# Patient Record
Sex: Female | Born: 1940 | Race: White | Hispanic: No | Smoking: Former smoker
Health system: Southern US, Community
[De-identification: ages and names within clinical notes are randomized; demographics above are authoritative.]

## PROBLEM LIST (undated history)

## (undated) DIAGNOSIS — E7800 Pure hypercholesterolemia, unspecified: Secondary | ICD-10-CM

## (undated) DIAGNOSIS — I10 Essential (primary) hypertension: Secondary | ICD-10-CM

## (undated) DIAGNOSIS — D509 Iron deficiency anemia, unspecified: Secondary | ICD-10-CM

## (undated) HISTORY — PX: ABDOMINAL HYSTERECTOMY: SHX81

## (undated) HISTORY — PX: APPENDECTOMY: SHX54

## (undated) HISTORY — PX: KNEE ARTHROSCOPY: SUR90

## (undated) HISTORY — DX: Iron deficiency anemia, unspecified: D50.9

---

## 1977-03-12 HISTORY — PX: PARTIAL HYSTERECTOMY: SHX80

## 1999-03-13 HISTORY — PX: LAPAROSCOPIC BILATERAL SALPINGO OOPHERECTOMY: SHX5890

## 1999-05-02 ENCOUNTER — Ambulatory Visit: Admission: RE | Admit: 1999-05-02 | Discharge: 1999-05-02 | Payer: Self-pay | Admitting: Gynecology

## 1999-05-05 ENCOUNTER — Encounter: Payer: Self-pay | Admitting: Gynecology

## 1999-05-09 ENCOUNTER — Inpatient Hospital Stay (HOSPITAL_COMMUNITY): Admission: RE | Admit: 1999-05-09 | Discharge: 1999-05-11 | Payer: Self-pay | Admitting: Gynecology

## 1999-05-09 ENCOUNTER — Encounter (INDEPENDENT_AMBULATORY_CARE_PROVIDER_SITE_OTHER): Payer: Self-pay

## 1999-07-05 ENCOUNTER — Ambulatory Visit: Admission: RE | Admit: 1999-07-05 | Discharge: 1999-07-05 | Payer: Self-pay | Admitting: Gynecology

## 2003-03-24 ENCOUNTER — Emergency Department (HOSPITAL_COMMUNITY): Admission: EM | Admit: 2003-03-24 | Discharge: 2003-03-24 | Payer: Self-pay | Admitting: *Deleted

## 2004-01-31 ENCOUNTER — Ambulatory Visit (HOSPITAL_COMMUNITY): Admission: RE | Admit: 2004-01-31 | Discharge: 2004-01-31 | Payer: Self-pay | Admitting: Internal Medicine

## 2004-05-12 ENCOUNTER — Ambulatory Visit (HOSPITAL_COMMUNITY): Admission: RE | Admit: 2004-05-12 | Discharge: 2004-05-12 | Payer: Self-pay | Admitting: Family Medicine

## 2004-12-28 ENCOUNTER — Ambulatory Visit (HOSPITAL_COMMUNITY): Admission: RE | Admit: 2004-12-28 | Discharge: 2004-12-28 | Payer: Self-pay | Admitting: Family Medicine

## 2005-01-04 ENCOUNTER — Ambulatory Visit: Payer: Self-pay | Admitting: Orthopedic Surgery

## 2005-07-19 ENCOUNTER — Ambulatory Visit (HOSPITAL_COMMUNITY): Admission: RE | Admit: 2005-07-19 | Discharge: 2005-07-19 | Payer: Self-pay | Admitting: Internal Medicine

## 2006-06-09 ENCOUNTER — Emergency Department (HOSPITAL_COMMUNITY): Admission: EM | Admit: 2006-06-09 | Discharge: 2006-06-09 | Payer: Self-pay | Admitting: Emergency Medicine

## 2006-12-05 ENCOUNTER — Ambulatory Visit (HOSPITAL_COMMUNITY): Admission: RE | Admit: 2006-12-05 | Discharge: 2006-12-05 | Payer: Self-pay | Admitting: Internal Medicine

## 2007-12-18 ENCOUNTER — Ambulatory Visit (HOSPITAL_COMMUNITY): Admission: RE | Admit: 2007-12-18 | Discharge: 2007-12-18 | Payer: Self-pay | Admitting: Internal Medicine

## 2008-03-12 HISTORY — PX: COLONOSCOPY: SHX174

## 2008-10-27 ENCOUNTER — Encounter: Payer: Self-pay | Admitting: Internal Medicine

## 2008-11-02 ENCOUNTER — Telehealth (INDEPENDENT_AMBULATORY_CARE_PROVIDER_SITE_OTHER): Payer: Self-pay

## 2008-11-16 ENCOUNTER — Encounter: Payer: Self-pay | Admitting: Internal Medicine

## 2008-11-22 ENCOUNTER — Encounter: Payer: Self-pay | Admitting: Internal Medicine

## 2008-11-22 ENCOUNTER — Ambulatory Visit (HOSPITAL_COMMUNITY): Admission: RE | Admit: 2008-11-22 | Discharge: 2008-11-22 | Payer: Self-pay | Admitting: Internal Medicine

## 2008-11-22 ENCOUNTER — Ambulatory Visit: Payer: Self-pay | Admitting: Internal Medicine

## 2008-11-23 ENCOUNTER — Encounter: Payer: Self-pay | Admitting: Internal Medicine

## 2008-12-20 ENCOUNTER — Ambulatory Visit (HOSPITAL_COMMUNITY): Admission: RE | Admit: 2008-12-20 | Discharge: 2008-12-20 | Payer: Self-pay | Admitting: Internal Medicine

## 2009-03-03 ENCOUNTER — Encounter (INDEPENDENT_AMBULATORY_CARE_PROVIDER_SITE_OTHER): Payer: Self-pay | Admitting: *Deleted

## 2009-04-05 ENCOUNTER — Encounter (INDEPENDENT_AMBULATORY_CARE_PROVIDER_SITE_OTHER): Payer: Self-pay | Admitting: *Deleted

## 2009-12-22 ENCOUNTER — Ambulatory Visit (HOSPITAL_COMMUNITY): Admission: RE | Admit: 2009-12-22 | Discharge: 2009-12-22 | Payer: Self-pay | Admitting: Internal Medicine

## 2010-04-11 NOTE — Letter (Signed)
Summary: Colonoscopy-Changed to Office Visit Letter  [facility]  [address] [place], [place] [address]   Phone: [phone]  Fax: [phone]      April 05, 2009 MRN: [id number]   Sara Fischer [address] [place], [place]  [address]   Dear Ms. Mcshan,   According to our records, it is time for you to schedule a Colonoscopy. However, after reviewing your medical record, I feel that an office visit would be most appropriate to more completely evaluate you and determine your need for a repeat procedure.  Please call [phone] (option #2) at your convenience to schedule an office visit. If you have any questions, concerns, or feel that this letter is in error, we would appreciate your call.   Sincerely,    [facility] [phone]

## 2010-11-08 ENCOUNTER — Ambulatory Visit (HOSPITAL_COMMUNITY)
Admission: RE | Admit: 2010-11-08 | Discharge: 2010-11-08 | Disposition: A | Discharge status: Home / Self Care | Payer: Medicare PPO | Source: Ambulatory Visit | Attending: Internal Medicine | Admitting: Internal Medicine

## 2010-11-08 ENCOUNTER — Encounter (HOSPITAL_COMMUNITY): Payer: Self-pay

## 2010-11-08 ENCOUNTER — Other Ambulatory Visit (HOSPITAL_COMMUNITY): Payer: Self-pay | Admitting: Internal Medicine

## 2010-11-08 DIAGNOSIS — M899 Disorder of bone, unspecified: Secondary | ICD-10-CM | POA: Insufficient documentation

## 2010-11-08 DIAGNOSIS — M949 Disorder of cartilage, unspecified: Secondary | ICD-10-CM | POA: Insufficient documentation

## 2010-11-08 DIAGNOSIS — R079 Chest pain, unspecified: Secondary | ICD-10-CM

## 2010-11-08 DIAGNOSIS — R0789 Other chest pain: Secondary | ICD-10-CM | POA: Insufficient documentation

## 2010-11-08 HISTORY — DX: Essential (primary) hypertension: I10

## 2010-12-15 ENCOUNTER — Other Ambulatory Visit (HOSPITAL_COMMUNITY): Payer: Self-pay | Admitting: Internal Medicine

## 2010-12-15 DIAGNOSIS — Z139 Encounter for screening, unspecified: Secondary | ICD-10-CM

## 2010-12-26 ENCOUNTER — Ambulatory Visit (HOSPITAL_COMMUNITY)
Admission: RE | Admit: 2010-12-26 | Discharge: 2010-12-26 | Disposition: A | Discharge status: Home / Self Care | Payer: Medicare PPO | Source: Ambulatory Visit | Attending: Internal Medicine | Admitting: Internal Medicine

## 2010-12-26 DIAGNOSIS — Z1231 Encounter for screening mammogram for malignant neoplasm of breast: Secondary | ICD-10-CM | POA: Insufficient documentation

## 2010-12-26 DIAGNOSIS — Z139 Encounter for screening, unspecified: Secondary | ICD-10-CM

## 2011-06-11 ENCOUNTER — Other Ambulatory Visit (HOSPITAL_COMMUNITY): Payer: Self-pay | Admitting: Family Medicine

## 2011-06-11 DIAGNOSIS — Z139 Encounter for screening, unspecified: Secondary | ICD-10-CM

## 2011-06-12 ENCOUNTER — Ambulatory Visit (HOSPITAL_COMMUNITY)
Admission: RE | Admit: 2011-06-12 | Discharge: 2011-06-12 | Disposition: A | Discharge status: Home / Self Care | Payer: Medicare Other | Source: Ambulatory Visit | Attending: Family Medicine | Admitting: Family Medicine

## 2011-06-12 DIAGNOSIS — M810 Age-related osteoporosis without current pathological fracture: Secondary | ICD-10-CM | POA: Insufficient documentation

## 2011-06-12 DIAGNOSIS — Z780 Asymptomatic menopausal state: Secondary | ICD-10-CM | POA: Insufficient documentation

## 2011-06-12 DIAGNOSIS — Z139 Encounter for screening, unspecified: Secondary | ICD-10-CM

## 2011-06-12 DIAGNOSIS — Z13820 Encounter for screening for osteoporosis: Secondary | ICD-10-CM | POA: Insufficient documentation

## 2011-12-19 ENCOUNTER — Other Ambulatory Visit (HOSPITAL_COMMUNITY): Payer: Self-pay | Admitting: Internal Medicine

## 2011-12-19 DIAGNOSIS — Z139 Encounter for screening, unspecified: Secondary | ICD-10-CM

## 2011-12-27 ENCOUNTER — Ambulatory Visit (HOSPITAL_COMMUNITY)
Admission: RE | Admit: 2011-12-27 | Discharge: 2011-12-27 | Disposition: A | Discharge status: Home / Self Care | Payer: Medicare Other | Source: Ambulatory Visit | Attending: Internal Medicine | Admitting: Internal Medicine

## 2011-12-27 DIAGNOSIS — Z139 Encounter for screening, unspecified: Secondary | ICD-10-CM

## 2011-12-27 DIAGNOSIS — Z1231 Encounter for screening mammogram for malignant neoplasm of breast: Secondary | ICD-10-CM | POA: Insufficient documentation

## 2012-12-17 ENCOUNTER — Other Ambulatory Visit (HOSPITAL_COMMUNITY): Payer: Self-pay | Admitting: Family Medicine

## 2012-12-17 DIAGNOSIS — Z139 Encounter for screening, unspecified: Secondary | ICD-10-CM

## 2012-12-30 ENCOUNTER — Ambulatory Visit (HOSPITAL_COMMUNITY)
Admission: RE | Admit: 2012-12-30 | Discharge: 2012-12-30 | Disposition: A | Discharge status: Home / Self Care | Payer: Medicare Other | Source: Ambulatory Visit | Attending: Family Medicine | Admitting: Family Medicine

## 2012-12-30 DIAGNOSIS — Z1231 Encounter for screening mammogram for malignant neoplasm of breast: Secondary | ICD-10-CM | POA: Insufficient documentation

## 2012-12-30 DIAGNOSIS — Z139 Encounter for screening, unspecified: Secondary | ICD-10-CM

## 2013-11-02 ENCOUNTER — Ambulatory Visit (INDEPENDENT_AMBULATORY_CARE_PROVIDER_SITE_OTHER): Payer: Commercial Managed Care - HMO | Admitting: Podiatry

## 2013-11-02 ENCOUNTER — Ambulatory Visit (INDEPENDENT_AMBULATORY_CARE_PROVIDER_SITE_OTHER): Payer: Commercial Managed Care - HMO

## 2013-11-02 ENCOUNTER — Encounter: Payer: Self-pay | Admitting: Podiatry

## 2013-11-02 VITALS — BP 142/68 | HR 73 | Resp 16 | Ht 62.0 in | Wt 170.0 lb

## 2013-11-02 DIAGNOSIS — M779 Enthesopathy, unspecified: Secondary | ICD-10-CM

## 2013-11-02 DIAGNOSIS — M898X9 Other specified disorders of bone, unspecified site: Secondary | ICD-10-CM

## 2013-11-02 DIAGNOSIS — L84 Corns and callosities: Secondary | ICD-10-CM | POA: Diagnosis not present

## 2013-11-02 DIAGNOSIS — M2040 Other hammer toe(s) (acquired), unspecified foot: Secondary | ICD-10-CM

## 2013-11-02 MED ORDER — TRIAMCINOLONE ACETONIDE 10 MG/ML IJ SUSP
10.0000 mg | Freq: Once | INTRAMUSCULAR | Status: AC
  Administered 2013-11-02: 10 mg

## 2013-11-02 NOTE — Progress Notes (Signed)
Subjective:     Patient ID: Sara Fischer, female   DOB: Oct 27, 1940, 73 y.o.   MRN: [id number]  Toe Pain    patient states she's getting a lot of pain between the fourth and fifth toes on her left foot and she tries today and 5 days a week and is now been able to due to the discomfort   Review of Systems  All other systems reviewed and are negative.      Objective:   Physical Exam  Nursing note and vitals reviewed. Constitutional: She is oriented to person, place, and time.  Cardiovascular: Intact distal pulses.   Musculoskeletal: Normal range of motion.  Neurological: She is oriented to person, place, and time.  Skin: Skin is warm and dry.   neurovascular status found to be intact with muscle strength adequate and range of motion subtalar midtarsal joint within normal limits. Digits are well-perfused and patient is well oriented x3 and is found to have a keratotic lesion between the fourth and fifth toes left foot with fluid buildup noted at the fourth MPJ metatarsal phalangeal     Assessment:     Capsulitis with interdigital keratotic lesion secondary to abnormal bone position    Plan:     H&P and x-ray reviewed and today I injected the fourth MPJ capsule 3 mg dexamethasone 5 mg Xylocaine and after appropriate numbness did deep debridement of lesion and applied dressing. Reappoint to recheck when symptomatic and may have to consider partial syndactylization surgery

## 2013-11-02 NOTE — Progress Notes (Signed)
   Subjective:    Patient ID: GRACIE GUPTA, female    DOB: Dec 12, 1940, 73 y.o.   MRN: [id number]  HPI Comments: "I have a toe that hurts"  Patient c/o burning 5th toe and in between 4th and 5th left foot for about 1 month. There is a callused area interdigitally. She keeps vaseline on it.  Toe Pain       Review of Systems  HENT: Positive for ear pain, sinus pressure and sneezing.   Respiratory: Positive for cough.   Gastrointestinal: Positive for constipation and abdominal distention.  Neurological: Positive for headaches.  Hematological: Bruises/bleeds easily.  All other systems reviewed and are negative.      Objective:   Physical Exam        Assessment & Plan:

## 2013-12-18 ENCOUNTER — Other Ambulatory Visit (HOSPITAL_COMMUNITY): Payer: Self-pay | Admitting: Internal Medicine

## 2013-12-18 DIAGNOSIS — Z1231 Encounter for screening mammogram for malignant neoplasm of breast: Secondary | ICD-10-CM

## 2014-01-01 ENCOUNTER — Ambulatory Visit (HOSPITAL_COMMUNITY)
Admission: RE | Admit: 2014-01-01 | Discharge: 2014-01-01 | Disposition: A | Discharge status: Home / Self Care | Payer: Medicare HMO | Source: Ambulatory Visit | Attending: Internal Medicine | Admitting: Internal Medicine

## 2014-01-01 DIAGNOSIS — Z1231 Encounter for screening mammogram for malignant neoplasm of breast: Secondary | ICD-10-CM | POA: Diagnosis present

## 2014-01-06 ENCOUNTER — Ambulatory Visit (HOSPITAL_COMMUNITY): Payer: Medicare HMO

## 2014-02-01 ENCOUNTER — Ambulatory Visit (INDEPENDENT_AMBULATORY_CARE_PROVIDER_SITE_OTHER): Payer: Commercial Managed Care - HMO | Admitting: Podiatry

## 2014-02-01 ENCOUNTER — Encounter: Payer: Self-pay | Admitting: Podiatry

## 2014-02-01 ENCOUNTER — Other Ambulatory Visit (HOSPITAL_COMMUNITY): Payer: Self-pay | Admitting: Family Medicine

## 2014-02-01 VITALS — BP 153/78 | HR 74 | Resp 16

## 2014-02-01 DIAGNOSIS — M779 Enthesopathy, unspecified: Secondary | ICD-10-CM

## 2014-02-01 DIAGNOSIS — L84 Corns and callosities: Secondary | ICD-10-CM

## 2014-02-01 MED ORDER — TRIAMCINOLONE ACETONIDE 10 MG/ML IJ SUSP
10.0000 mg | Freq: Once | INTRAMUSCULAR | Status: AC
  Administered 2014-02-01: 10 mg

## 2014-02-01 NOTE — Progress Notes (Signed)
Subjective:     Patient ID: Sara Fischer, female   DOB: [birth year], 73 y.o.   MRN: [id number]  HPI patient states she is developing more discomfort between the fourth and fifth toes on the left foot with irritation fluid buildup and keratotic lesion formation   Review of Systems     Objective:   Physical Exam Neurovascular status unchanged with inflammation and pain fourth interspace left foot with fluid buildup and keratotic tissue formation    Assessment:     Metatarsal phalangeal joint capsulitis with inflammatory changes and keratotic lesion formation between the fourth and fifth toes left    Plan:     Explained the possibilities for surgery at one point in the future and today did an injection 3 mg Kenalog 5 mg Xylocaine and then did deep debridement of lesions with no iatrogenic bleeding noted applied sterile dressing and reappoint to recheck when symptomatic

## 2014-02-17 ENCOUNTER — Other Ambulatory Visit (HOSPITAL_COMMUNITY): Payer: Self-pay | Admitting: Family Medicine

## 2014-02-17 DIAGNOSIS — M159 Polyosteoarthritis, unspecified: Secondary | ICD-10-CM

## 2014-02-17 DIAGNOSIS — M150 Primary generalized (osteo)arthritis: Principal | ICD-10-CM

## 2014-02-22 ENCOUNTER — Ambulatory Visit (HOSPITAL_COMMUNITY)
Admission: RE | Admit: 2014-02-22 | Discharge: 2014-02-22 | Disposition: A | Discharge status: Home / Self Care | Payer: Commercial Managed Care - HMO | Source: Ambulatory Visit | Attending: Family Medicine | Admitting: Family Medicine

## 2014-02-22 DIAGNOSIS — M810 Age-related osteoporosis without current pathological fracture: Secondary | ICD-10-CM | POA: Diagnosis not present

## 2014-02-22 DIAGNOSIS — Z13820 Encounter for screening for osteoporosis: Secondary | ICD-10-CM | POA: Diagnosis not present

## 2014-02-22 DIAGNOSIS — Z780 Asymptomatic menopausal state: Secondary | ICD-10-CM | POA: Insufficient documentation

## 2014-02-22 DIAGNOSIS — Z90722 Acquired absence of ovaries, bilateral: Secondary | ICD-10-CM | POA: Insufficient documentation

## 2014-02-22 DIAGNOSIS — M159 Polyosteoarthritis, unspecified: Secondary | ICD-10-CM

## 2014-02-22 DIAGNOSIS — M150 Primary generalized (osteo)arthritis: Secondary | ICD-10-CM

## 2014-03-17 DIAGNOSIS — Z85828 Personal history of other malignant neoplasm of skin: Secondary | ICD-10-CM | POA: Diagnosis not present

## 2014-03-17 DIAGNOSIS — L570 Actinic keratosis: Secondary | ICD-10-CM | POA: Diagnosis not present

## 2014-03-17 DIAGNOSIS — L219 Seborrheic dermatitis, unspecified: Secondary | ICD-10-CM | POA: Diagnosis not present

## 2014-03-29 DIAGNOSIS — E538 Deficiency of other specified B group vitamins: Secondary | ICD-10-CM | POA: Diagnosis not present

## 2014-04-29 DIAGNOSIS — E538 Deficiency of other specified B group vitamins: Secondary | ICD-10-CM | POA: Diagnosis not present

## 2014-05-12 DIAGNOSIS — E6609 Other obesity due to excess calories: Secondary | ICD-10-CM | POA: Diagnosis not present

## 2014-05-12 DIAGNOSIS — I10 Essential (primary) hypertension: Secondary | ICD-10-CM | POA: Diagnosis not present

## 2014-05-12 DIAGNOSIS — M65811 Other synovitis and tenosynovitis, right shoulder: Secondary | ICD-10-CM | POA: Diagnosis not present

## 2014-05-12 DIAGNOSIS — Z6831 Body mass index (BMI) 31.0-31.9, adult: Secondary | ICD-10-CM | POA: Diagnosis not present

## 2014-05-27 DIAGNOSIS — E538 Deficiency of other specified B group vitamins: Secondary | ICD-10-CM | POA: Diagnosis not present

## 2014-07-01 DIAGNOSIS — E538 Deficiency of other specified B group vitamins: Secondary | ICD-10-CM | POA: Diagnosis not present

## 2014-07-27 DIAGNOSIS — E538 Deficiency of other specified B group vitamins: Secondary | ICD-10-CM | POA: Diagnosis not present

## 2014-08-26 DIAGNOSIS — E538 Deficiency of other specified B group vitamins: Secondary | ICD-10-CM | POA: Diagnosis not present

## 2014-09-07 ENCOUNTER — Ambulatory Visit (INDEPENDENT_AMBULATORY_CARE_PROVIDER_SITE_OTHER): Payer: Commercial Managed Care - HMO | Admitting: Podiatry

## 2014-09-07 ENCOUNTER — Encounter: Payer: Self-pay | Admitting: Podiatry

## 2014-09-07 VITALS — BP 174/93 | HR 63 | Resp 12

## 2014-09-07 DIAGNOSIS — M779 Enthesopathy, unspecified: Secondary | ICD-10-CM

## 2014-09-07 DIAGNOSIS — L600 Ingrowing nail: Secondary | ICD-10-CM

## 2014-09-07 DIAGNOSIS — L84 Corns and callosities: Secondary | ICD-10-CM | POA: Diagnosis not present

## 2014-09-07 MED ORDER — TRIAMCINOLONE ACETONIDE 10 MG/ML IJ SUSP
10.0000 mg | Freq: Once | INTRAMUSCULAR | Status: AC
  Administered 2014-09-07: 10 mg

## 2014-09-07 NOTE — Patient Instructions (Signed)

## 2014-09-07 NOTE — Progress Notes (Signed)
   Subjective:    Patient ID: Sara Fischer, female    DOB: 1941/01/07, 74 y.o.   MRN: [id number]  HPI Patient presents L 2nd ingrown toenail and a painful spot in between L toes 4-5   Review of Systems     Objective:   Physical Exam        Assessment & Plan:

## 2014-09-08 NOTE — Progress Notes (Signed)
Subjective:     Patient ID: Sara Fischer, female   DOB: [birth year], 74 y.o.   MRN: [id number]  HPI patient presents stating my second nail is very painful on my left foot and I have a spot between my fourth and fifth toes which has flared up is fluid in it and it lesion and is painful   Review of Systems     Objective:   Physical Exam Neurovascular status was found to be intact with digital perfusion is normal and patient's found to be well oriented 3. Patient is noted to have an incurvated left second nail lateral border and inflammation and fluid of the fourth interspace left with keratotic lesion on the fifth and fourth toe    Assessment:     Ingrown toenail deformity left second digit lateral side along with inflammatory capsulitis fourth interspace left and keratotic lesion on the side of the toe    Plan:     Reviewed condition and at this time I've recommended correction of the ingrown toenail explaining procedure to patient. Patient wants surgery and today I infiltrated the left second toe 60 mg Xylocaine Marcaine mixture remove the corner exposed matrix and applied phenol 3 applications 30 seconds followed by alcohol lavage and sterile dressing. I then injected the fourth interspace 3 mg Kenalog dexamethasone 5 mg Xylocaine and did full debridement of lesion which was tolerated well

## 2014-09-22 DIAGNOSIS — Z85820 Personal history of malignant melanoma of skin: Secondary | ICD-10-CM | POA: Diagnosis not present

## 2014-09-22 DIAGNOSIS — Z85828 Personal history of other malignant neoplasm of skin: Secondary | ICD-10-CM | POA: Diagnosis not present

## 2014-09-22 DIAGNOSIS — L570 Actinic keratosis: Secondary | ICD-10-CM | POA: Diagnosis not present

## 2014-09-27 DIAGNOSIS — E538 Deficiency of other specified B group vitamins: Secondary | ICD-10-CM | POA: Diagnosis not present

## 2014-10-27 DIAGNOSIS — I10 Essential (primary) hypertension: Secondary | ICD-10-CM | POA: Diagnosis not present

## 2014-10-27 DIAGNOSIS — Z6831 Body mass index (BMI) 31.0-31.9, adult: Secondary | ICD-10-CM | POA: Diagnosis not present

## 2014-10-27 DIAGNOSIS — Z1389 Encounter for screening for other disorder: Secondary | ICD-10-CM | POA: Diagnosis not present

## 2014-10-27 DIAGNOSIS — E538 Deficiency of other specified B group vitamins: Secondary | ICD-10-CM | POA: Diagnosis not present

## 2014-10-27 DIAGNOSIS — E782 Mixed hyperlipidemia: Secondary | ICD-10-CM | POA: Diagnosis not present

## 2014-10-27 DIAGNOSIS — E6609 Other obesity due to excess calories: Secondary | ICD-10-CM | POA: Diagnosis not present

## 2014-10-27 DIAGNOSIS — Z0001 Encounter for general adult medical examination with abnormal findings: Secondary | ICD-10-CM | POA: Diagnosis not present

## 2014-10-29 DIAGNOSIS — E876 Hypokalemia: Secondary | ICD-10-CM | POA: Diagnosis not present

## 2014-11-03 DIAGNOSIS — Z0000 Encounter for general adult medical examination without abnormal findings: Secondary | ICD-10-CM | POA: Diagnosis not present

## 2014-11-22 ENCOUNTER — Ambulatory Visit (INDEPENDENT_AMBULATORY_CARE_PROVIDER_SITE_OTHER): Payer: Commercial Managed Care - HMO | Admitting: Cardiovascular Disease

## 2014-11-22 ENCOUNTER — Encounter: Payer: Self-pay | Admitting: Cardiovascular Disease

## 2014-11-22 VITALS — BP 138/74 | HR 64 | Ht 62.0 in | Wt 175.0 lb

## 2014-11-22 DIAGNOSIS — R9431 Abnormal electrocardiogram [ECG] [EKG]: Secondary | ICD-10-CM

## 2014-11-22 DIAGNOSIS — R011 Cardiac murmur, unspecified: Secondary | ICD-10-CM | POA: Diagnosis not present

## 2014-11-22 NOTE — Patient Instructions (Signed)
Your physician recommends that you continue on your current medications as directed. Please refer to the Current Medication list given to you today. Your physician has requested that you have an echocardiogram. Echocardiography is a painless test that uses sound waves to create images of your heart. It provides your doctor with information about the size and shape of your heart and how well your heart's chambers and valves are working. This procedure takes approximately one hour. There are no restrictions for this procedure. Your physician recommends that you schedule a follow-up appointment in: as needed.

## 2014-11-22 NOTE — Progress Notes (Signed)
Patient ID: Sara Fischer, female   DOB: 06/25/1940, 74 y.o.   MRN: [id number]     Cardiology Office Note   Date:  11/22/2014   ID:  Sara Fischer, DOB March 11, 1941, MRN [id number]  PCP:  Glo Herring., MD  Cardiologist:   Jenkins Rouge, MD   No chief complaint on file.     History of Present Illness: Sara Fischer is a 74 y.o. female who presents for cardiac evaluation.  She had seen Dr Lattie Haw in the late 90's but not clear that she has ever had active or clinical heart disease.  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  Denies chest pain , dyspnea, palpitations or syncope.  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  Denies history of murmur.  Compliant with BP meds and diuretic for venous insufficiency.  Takes red yeast rice for cholesterol      Past Medical History  Diagnosis Date  . Hypertension     No past surgical history on file.   Current Outpatient Prescriptions  Medication Sig Dispense Refill  . beta carotene w/minerals (OCUVITE) tablet Take 1 tablet by mouth daily.    . Cholecalciferol (D3-1000 PO) Take by mouth 2 (two) times daily.    Marland Kitchen co-enzyme Q-10 30 MG capsule Take 30 mg by mouth 3 (three) times daily.    . Flaxseed, Linseed, (FLAX SEEDS PO) Take by mouth.    . furosemide (LASIX) 40 MG tablet Take 40 mg by mouth.    Marland Kitchen ketoconazole (NIZORAL) 2 % cream   0  . metoprolol succinate (TOPROL-XL) 100 MG 24 hr tablet Take 100 mg by mouth daily. Take with or immediately following a meal.    . Red Yeast Rice Extract (RED YEAST RICE PO) Take by mouth.    . simvastatin (ZOCOR) 20 MG tablet Take 20 mg by mouth daily.    Marland Kitchen VITAMIN B1-B12 IM Inject into the muscle.     No current facility-administered medications for this visit.    Allergies:   Feldene and Lodine    Social History:  The patient  reports that she quit smoking about 51 years ago. She does not have any smokeless tobacco  history on file. She reports that she drinks alcohol.   Family History:  The patient's family history is not on file.    ROS:  Please see the history of present illness.   Otherwise, review of systems are positive for none.   All other systems are reviewed and negative.    PHYSICAL EXAM: VS:  There were no vitals taken for this visit. , BMI There is no weight on file to calculate BMI. Affect appropriate Healthy:  appears stated age 20: normal Neck supple with no adenopathy JVP normal no bruits no thyromegaly Lungs clear with no wheezing and good diaphragmatic motion Heart:  S1/S2 SEM  murmur, no rub, gallop or click PMI normal Abdomen: benighn, BS positve, no tenderness, no AAA no bruit.  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    Lipid Panel No results found for: CHOL, TRIG, HDL, CHOLHDL,  VLDL, LDLCALC, LDLDIRECT    Wt Readings from Last 3 Encounters:  11/02/13 77.111 kg (170 lb)      Other studies Reviewed: Additional studies/ records that were reviewed today include: Notes from Dr Gerarda Fraction Primary and ECG .    ASSESSMENT AND PLAN:  1.  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 Chol:  Labs with Dr Gerarda Fraction continue red yeast rice 3. HTN:  Well controlled.  Continue current medications and low sodium Dash type diet.   4. Edema:  From varicosities continue diuretic  5. Murmur:  SEM of aortic sclerosis see #1 above Echo    Current medicines are reviewed at length with the patient today.  The patient does not have concerns regarding medicines.  The following changes have been made:  none  Labs/ tests ordered today include: Echo  No orders of the defined types were  placed in this encounter.     Disposition:   FU with PRN     Signed, Jenkins Rouge, MD  11/22/2014 1:09 PM    [facility] [place], [place], [place]  [address] Phone: [phone]; Fax: [phone]

## 2014-11-25 ENCOUNTER — Ambulatory Visit (HOSPITAL_COMMUNITY)
Admission: RE | Admit: 2014-11-25 | Discharge: 2014-11-25 | Disposition: A | Discharge status: Home / Self Care | Payer: Commercial Managed Care - HMO | Source: Ambulatory Visit | Attending: Cardiovascular Disease | Admitting: Cardiovascular Disease

## 2014-11-25 DIAGNOSIS — R9431 Abnormal electrocardiogram [ECG] [EKG]: Secondary | ICD-10-CM | POA: Diagnosis not present

## 2014-11-25 DIAGNOSIS — R011 Cardiac murmur, unspecified: Secondary | ICD-10-CM | POA: Insufficient documentation

## 2014-11-30 DIAGNOSIS — E538 Deficiency of other specified B group vitamins: Secondary | ICD-10-CM | POA: Diagnosis not present

## 2014-12-06 ENCOUNTER — Encounter: Payer: Self-pay | Admitting: Podiatry

## 2014-12-06 ENCOUNTER — Ambulatory Visit (INDEPENDENT_AMBULATORY_CARE_PROVIDER_SITE_OTHER): Payer: Commercial Managed Care - HMO | Admitting: Podiatry

## 2014-12-06 VITALS — BP 152/64 | HR 75 | Resp 16

## 2014-12-06 DIAGNOSIS — L84 Corns and callosities: Secondary | ICD-10-CM | POA: Diagnosis not present

## 2014-12-06 DIAGNOSIS — M2040 Other hammer toe(s) (acquired), unspecified foot: Secondary | ICD-10-CM

## 2014-12-06 NOTE — Patient Instructions (Signed)
Pre-Operative Instructions  Congratulations, you have decided to take an important step to improving your quality of life.  You can be assured that the doctors of [facility] will be with you every step of the way.  1. Plan to be at the surgery center/hospital at least 1 (one) hour prior to your scheduled time unless otherwise directed by the surgical center/hospital staff.  You must have a responsible adult accompany you, remain during the surgery and drive you home.  Make sure you have directions to the surgical center/hospital and know how to get there on time. 2. For hospital based surgery you will need to obtain a history and physical form from your family physician within 1 month prior to the date of surgery- we will give you a form for you primary physician.  3. We make every effort to accommodate the date you request for surgery.  There are however, times where surgery dates or times have to be moved.  We will contact you as soon as possible if a change in schedule is required.   4. No Aspirin/Ibuprofen for one week before surgery.  If you are on aspirin, any non-steroidal anti-inflammatory medications (Mobic, Aleve, Ibuprofen) you should stop taking it 7 days prior to your surgery.  You make take Tylenol  For pain prior to surgery.  5. Medications- If you are taking daily heart and blood pressure medications, seizure, reflux, allergy, asthma, anxiety, pain or diabetes medications, make sure the surgery center/hospital is aware before the day of surgery so they may notify you which medications to take or avoid the day of surgery. 6. No food or drink after midnight the night before surgery unless directed otherwise by surgical center/hospital staff. 7. No alcoholic beverages 24 hours prior to surgery.  No smoking 24 hours prior to or 24 hours after surgery. 8. Wear loose pants or shorts- loose enough to fit over bandages, boots, and casts. 9. No slip on shoes, sneakers are best. 10. Bring  your boot with you to the surgery center/hospital.  Also bring crutches or a walker if your physician has prescribed it for you.  If you do not have this equipment, it will be provided for you after surgery. 11. If you have not been contracted by the surgery center/hospital by the day before your surgery, call to confirm the date and time of your surgery. 12. Leave-time from work may vary depending on the type of surgery you have.  Appropriate arrangements should be made prior to surgery with your employer. 13. Prescriptions will be provided immediately following surgery by your doctor.  Have these filled as soon as possible after surgery and take the medication as directed. 14. Remove nail polish on the operative foot. 15. Wash the night before surgery.  The night before surgery wash the foot and leg well with the antibacterial soap provided and water paying special attention to beneath the toenails and in between the toes.  Rinse thoroughly with water and dry well with a towel.  Perform this wash unless told not to do so by your physician.  Enclosed: 1 Ice pack (please put in freezer the night before surgery)   1 Hibiclens skin cleaner   Pre-op Instructions  If you have any questions regarding the instructions, do not hesitate to call our office.  [place]: [address] [place], [place] [address] [phone]  [place]: [address] [place], [place] [address] [phone]  [place]: [address]  [place], [place] [address] [phone]  Dr. Richard   Tuchman DPM, Dr. Norman Regal DPM Dr. Richard Sikora DPM, Dr. M. Todd Hyatt DPM, Dr. Kathryn Egerton DPM 

## 2014-12-06 NOTE — Progress Notes (Signed)
Subjective:     Patient ID: Sara Fischer, female   DOB: 14-Mar-1940, 74 y.o.   MRN: [id number]  HPI patient states spot between her fourth and fifth toe on her left foot is also again and she only had around one month of relief. She states that she cannot wear shoe gear or ambulate with any degree of comfort and that she has tried wider shoes she's tried trimming padding injection treatment in order to get the interspace improved   Review of Systems     Objective:   Physical Exam Neurovascular status is intact muscle strength adequate range of motion within normal limits with significant interspace lesion between the fourth and fifth toes right and hypertrophy and enlargement of the head of the proximal phalanx fifth toe left with pain. 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 This is been recommended to be corrected and she wants it fixed and I recommended arthroplasty along with partial soft tissue syndactylization procedure. Patient is willing to accept risk of surgery and wants this performed and at this time I reviewed arthroplasty syndactylization and allowed her to read consent form reviewing alternative treatments and complications. Patient signed consent form is given all preoperative instructions at the current time and will have surgery performed in the next 3-4 weeks. X-ray report indicates enlargement of the head of the proximal phalanx fifth toe left but in against the base of the fourth toe left foot

## 2014-12-23 ENCOUNTER — Telehealth: Payer: Self-pay | Admitting: *Deleted

## 2014-12-23 DIAGNOSIS — H04123 Dry eye syndrome of bilateral lacrimal glands: Secondary | ICD-10-CM | POA: Diagnosis not present

## 2014-12-23 DIAGNOSIS — H10413 Chronic giant papillary conjunctivitis, bilateral: Secondary | ICD-10-CM | POA: Diagnosis not present

## 2014-12-23 DIAGNOSIS — H26493 Other secondary cataract, bilateral: Secondary | ICD-10-CM | POA: Diagnosis not present

## 2014-12-23 DIAGNOSIS — Z961 Presence of intraocular lens: Secondary | ICD-10-CM | POA: Diagnosis not present

## 2014-12-23 NOTE — Telephone Encounter (Signed)
Request for pre-certification was faxed to [facility] for surgery that is scheduled for 01/04/2015 with Dr. Paulla Dolly.

## 2014-12-27 DIAGNOSIS — E538 Deficiency of other specified B group vitamins: Secondary | ICD-10-CM | POA: Diagnosis not present

## 2014-12-27 DIAGNOSIS — Z23 Encounter for immunization: Secondary | ICD-10-CM | POA: Diagnosis not present

## 2015-01-03 NOTE — Telephone Encounter (Signed)
I'm calling to check on the status of surgery scheduled for tomorrow.  "Surgery was authorized for 01/04/2015 for code [id number].  Authorization number is [id number]."  I called and gave Caren Griffins the authorization number at [facility].

## 2015-01-04 ENCOUNTER — Encounter: Payer: Self-pay | Admitting: Podiatry

## 2015-01-04 DIAGNOSIS — M2042 Other hammer toe(s) (acquired), left foot: Secondary | ICD-10-CM | POA: Diagnosis not present

## 2015-01-04 DIAGNOSIS — I10 Essential (primary) hypertension: Secondary | ICD-10-CM | POA: Diagnosis not present

## 2015-01-04 DIAGNOSIS — Q7032 Webbed toes, left foot: Secondary | ICD-10-CM | POA: Diagnosis not present

## 2015-01-04 DIAGNOSIS — L859 Epidermal thickening, unspecified: Secondary | ICD-10-CM | POA: Diagnosis not present

## 2015-01-14 ENCOUNTER — Ambulatory Visit (INDEPENDENT_AMBULATORY_CARE_PROVIDER_SITE_OTHER): Payer: Commercial Managed Care - HMO

## 2015-01-14 ENCOUNTER — Ambulatory Visit (INDEPENDENT_AMBULATORY_CARE_PROVIDER_SITE_OTHER): Payer: Commercial Managed Care - HMO | Admitting: Podiatry

## 2015-01-14 VITALS — BP 142/68 | HR 72 | Temp 98.3°F | Resp 16

## 2015-01-14 DIAGNOSIS — Z9889 Other specified postprocedural states: Secondary | ICD-10-CM

## 2015-01-14 DIAGNOSIS — M2040 Other hammer toe(s) (acquired), unspecified foot: Secondary | ICD-10-CM | POA: Diagnosis not present

## 2015-01-14 MED ORDER — CEPHALEXIN 500 MG PO CAPS: 500.0000 mg | ORAL_CAPSULE | Freq: Three times a day (TID) | ORAL | Status: DC

## 2015-01-20 ENCOUNTER — Other Ambulatory Visit (HOSPITAL_COMMUNITY): Payer: Self-pay | Admitting: Internal Medicine

## 2015-01-20 DIAGNOSIS — Z1231 Encounter for screening mammogram for malignant neoplasm of breast: Secondary | ICD-10-CM

## 2015-01-20 NOTE — Progress Notes (Signed)
Patient ID: Sara Fischer, female   DOB: 09-07-1940, 74 y.o.   MRN: [id number]  Subjective: Sara Fischer is a 74 y.o. fe/female patient seen today in office s/p hammertoe repair and 4th interspace webbing procedure preformed on 01/04/15 with Dr. Paulla Dolly. They state their pain is controlled with the current pain medication. She does get pain when she first gets up in the morning. She is continue the surgical shoe. Denies any systemic complaints such as fevers, chills, nausea, vomiting. No calf pain, chest pain, shortness of breath.   Objective: General: No acute distress, AAOx3  DP/PT pulses palpable 2/4, CRT < 3 sec to all digits.  Protective sensation intact. Motor function intact.  Left foot: Incision is well coapted without any evidence of dehiscence. There is no surrounding erythema, ascending cellulitis, fluctuance, crepitus, malodor, purulence. There is a small amount of macerated tissue around the incisions and there is a small amount of serous drainage expressed from the incision. There is mild edema around the surgical site. There is slight pain along the surgical site. No other areas of tenderness to bilateral lower extremities. No other open lesions or pre-ulcerative lesions. No pain with calf compression, swelling, warmth, erythema.   Assessment and Plan:  Status post left foot surgery, doing well however there is some macerated tissue with a small amount of drainage from the incision.  -Treatment options discussed including all alternatives, risks, and complications -Antibiotic ointment was placed over the incision followed by dry sterile dressing. -Prescribed Keflex due to drainage. -Continue surgical shoe. -Ice/elevation -Pain medication as needed. -Monitor for any clinical signs or symptoms of infection and DVT/PE and directed to call the office immediately should any occur or go to the ER. -Follow-up in 1 week with Dr. Paulla Dolly or sooner if any problems arise. In the meantime,  encouraged to call the office with any questions, concerns, change in symptoms.   Celesta Gentile, DPM

## 2015-01-21 ENCOUNTER — Ambulatory Visit (INDEPENDENT_AMBULATORY_CARE_PROVIDER_SITE_OTHER): Payer: Commercial Managed Care - HMO | Admitting: Podiatry

## 2015-01-21 ENCOUNTER — Ambulatory Visit (INDEPENDENT_AMBULATORY_CARE_PROVIDER_SITE_OTHER): Payer: Commercial Managed Care - HMO

## 2015-01-21 DIAGNOSIS — M2040 Other hammer toe(s) (acquired), unspecified foot: Secondary | ICD-10-CM | POA: Diagnosis not present

## 2015-01-21 DIAGNOSIS — Z9889 Other specified postprocedural states: Secondary | ICD-10-CM

## 2015-01-23 NOTE — Progress Notes (Signed)
Subjective:     Patient ID: Sara Fischer, female   DOB: 1940-12-24, 74 y.o.   MRN: [id number]  HPI patient states I'm doing real well with my left toe and I feel like it's healing   Review of Systems     Objective:   Physical Exam Neurovascular status intact with wound edges well coapted left fifth digit interdigital secondary to partial syndactylization with arthroplasty    Assessment:     Doing well post surgery left    Plan:     Stitches removed wound edges coapted well no drainage was noted. Instructed on gradual return soft shoes in the next 2-3 weeks and that if any redness swelling drainage were to occur to let us know immediately if not it should heal uneventfully

## 2015-01-24 ENCOUNTER — Ambulatory Visit (HOSPITAL_COMMUNITY)
Admission: RE | Admit: 2015-01-24 | Discharge: 2015-01-24 | Disposition: A | Discharge status: Home / Self Care | Payer: Commercial Managed Care - HMO | Source: Ambulatory Visit | Attending: Internal Medicine | Admitting: Internal Medicine

## 2015-01-24 DIAGNOSIS — Z1231 Encounter for screening mammogram for malignant neoplasm of breast: Secondary | ICD-10-CM | POA: Diagnosis not present

## 2015-01-27 DIAGNOSIS — E538 Deficiency of other specified B group vitamins: Secondary | ICD-10-CM | POA: Diagnosis not present

## 2015-02-11 ENCOUNTER — Telehealth: Payer: Self-pay | Admitting: *Deleted

## 2015-02-11 NOTE — Telephone Encounter (Signed)
Pt states she is trying to get an appt.  Transferred to schedulers.

## 2015-02-11 NOTE — Progress Notes (Signed)
Patient ID: Sara Fischer, female   DOB: 1940-04-22, 74 y.o.   MRN: [id number] Dr Paulla Dolly performed a left 5ht hammertoe repair and partial sewing together of 4th and 5th toes left foot on 01/04/15 at [facility]

## 2015-02-11 NOTE — Telephone Encounter (Signed)
Pt states she has questions concerning the surgery of 12/2014.  Pt states she continues to have soreness at the bottom of her foot, and very dry skin.  I told pt she should see Dr. Paulla Dolly to discuss the continued pain and swelling, but she could begin a light lotion on the surgical foot but not on any suture lines yet.  Pt states understanding and is transferred to schedulers.

## 2015-02-14 ENCOUNTER — Ambulatory Visit (INDEPENDENT_AMBULATORY_CARE_PROVIDER_SITE_OTHER): Payer: Commercial Managed Care - HMO

## 2015-02-14 ENCOUNTER — Encounter: Payer: Self-pay | Admitting: Podiatry

## 2015-02-14 ENCOUNTER — Ambulatory Visit (INDEPENDENT_AMBULATORY_CARE_PROVIDER_SITE_OTHER): Payer: Commercial Managed Care - HMO | Admitting: Podiatry

## 2015-02-14 VITALS — BP 137/65 | HR 72 | Resp 16

## 2015-02-14 DIAGNOSIS — Z9889 Other specified postprocedural states: Secondary | ICD-10-CM

## 2015-02-14 DIAGNOSIS — R600 Localized edema: Secondary | ICD-10-CM

## 2015-02-14 DIAGNOSIS — M779 Enthesopathy, unspecified: Secondary | ICD-10-CM

## 2015-02-15 NOTE — Progress Notes (Signed)
Subjective:     Patient ID: Sara Fischer, female   DOB: March 09, 1941, 74 y.o.   MRN: [id number]  HPI patient states the bottom of my left foot is sore and I've tried to walk but it does not seem to be sore where the surgery was done   Review of Systems     Objective:   Physical Exam Neurovascular status is intact with patient's surgical procedure doing well with good alignment and edema in the forefoot left with negative Homans sign noted.    Assessment:     Forefoot +1 pitting edema with no indication systemic causes creating inflammation and pain    Plan:     Reviewed condition and applied Unna boot to help to reduce the swelling in the foot. Reappoint to recheck and advised on elevation and what to do the toe should turn color by removing the boot immediately

## 2015-03-01 DIAGNOSIS — Z1389 Encounter for screening for other disorder: Secondary | ICD-10-CM | POA: Diagnosis not present

## 2015-03-01 DIAGNOSIS — E6609 Other obesity due to excess calories: Secondary | ICD-10-CM | POA: Diagnosis not present

## 2015-03-01 DIAGNOSIS — J069 Acute upper respiratory infection, unspecified: Secondary | ICD-10-CM | POA: Diagnosis not present

## 2015-03-01 DIAGNOSIS — E538 Deficiency of other specified B group vitamins: Secondary | ICD-10-CM | POA: Diagnosis not present

## 2015-03-01 DIAGNOSIS — Z6830 Body mass index (BMI) 30.0-30.9, adult: Secondary | ICD-10-CM | POA: Diagnosis not present

## 2015-03-28 DIAGNOSIS — L570 Actinic keratosis: Secondary | ICD-10-CM | POA: Diagnosis not present

## 2015-03-28 DIAGNOSIS — Z85828 Personal history of other malignant neoplasm of skin: Secondary | ICD-10-CM | POA: Diagnosis not present

## 2015-03-28 DIAGNOSIS — Z85820 Personal history of malignant melanoma of skin: Secondary | ICD-10-CM | POA: Diagnosis not present

## 2015-03-28 DIAGNOSIS — E538 Deficiency of other specified B group vitamins: Secondary | ICD-10-CM | POA: Diagnosis not present

## 2015-04-29 DIAGNOSIS — E538 Deficiency of other specified B group vitamins: Secondary | ICD-10-CM | POA: Diagnosis not present

## 2015-05-27 DIAGNOSIS — E538 Deficiency of other specified B group vitamins: Secondary | ICD-10-CM | POA: Diagnosis not present

## 2015-07-01 DIAGNOSIS — E782 Mixed hyperlipidemia: Secondary | ICD-10-CM | POA: Diagnosis not present

## 2015-07-01 DIAGNOSIS — M6740 Ganglion, unspecified site: Secondary | ICD-10-CM | POA: Diagnosis not present

## 2015-07-01 DIAGNOSIS — E6609 Other obesity due to excess calories: Secondary | ICD-10-CM | POA: Diagnosis not present

## 2015-07-01 DIAGNOSIS — Z6831 Body mass index (BMI) 31.0-31.9, adult: Secondary | ICD-10-CM | POA: Diagnosis not present

## 2015-07-01 DIAGNOSIS — Z1389 Encounter for screening for other disorder: Secondary | ICD-10-CM | POA: Diagnosis not present

## 2015-07-01 DIAGNOSIS — E538 Deficiency of other specified B group vitamins: Secondary | ICD-10-CM | POA: Diagnosis not present

## 2015-07-28 DIAGNOSIS — E538 Deficiency of other specified B group vitamins: Secondary | ICD-10-CM | POA: Diagnosis not present

## 2015-08-25 DIAGNOSIS — E538 Deficiency of other specified B group vitamins: Secondary | ICD-10-CM | POA: Diagnosis not present

## 2015-09-06 DIAGNOSIS — Z85820 Personal history of malignant melanoma of skin: Secondary | ICD-10-CM | POA: Diagnosis not present

## 2015-09-06 DIAGNOSIS — L570 Actinic keratosis: Secondary | ICD-10-CM | POA: Diagnosis not present

## 2015-10-12 ENCOUNTER — Telehealth: Payer: Self-pay | Admitting: Internal Medicine

## 2015-10-12 NOTE — Telephone Encounter (Signed)
RECALL FOR TCS °

## 2015-10-12 NOTE — Telephone Encounter (Signed)
Letter mailed to pt.  

## 2015-10-27 DIAGNOSIS — E538 Deficiency of other specified B group vitamins: Secondary | ICD-10-CM | POA: Diagnosis not present

## 2015-11-25 DIAGNOSIS — Z23 Encounter for immunization: Secondary | ICD-10-CM | POA: Diagnosis not present

## 2015-11-25 DIAGNOSIS — I10 Essential (primary) hypertension: Secondary | ICD-10-CM | POA: Diagnosis not present

## 2015-11-25 DIAGNOSIS — J301 Allergic rhinitis due to pollen: Secondary | ICD-10-CM | POA: Diagnosis not present

## 2015-11-25 DIAGNOSIS — E538 Deficiency of other specified B group vitamins: Secondary | ICD-10-CM | POA: Diagnosis not present

## 2015-11-25 DIAGNOSIS — Z6830 Body mass index (BMI) 30.0-30.9, adult: Secondary | ICD-10-CM | POA: Diagnosis not present

## 2015-11-25 DIAGNOSIS — E782 Mixed hyperlipidemia: Secondary | ICD-10-CM | POA: Diagnosis not present

## 2015-12-19 ENCOUNTER — Other Ambulatory Visit (HOSPITAL_COMMUNITY): Payer: Self-pay | Admitting: Family Medicine

## 2015-12-19 DIAGNOSIS — Z1231 Encounter for screening mammogram for malignant neoplasm of breast: Secondary | ICD-10-CM

## 2015-12-27 DIAGNOSIS — E538 Deficiency of other specified B group vitamins: Secondary | ICD-10-CM | POA: Diagnosis not present

## 2016-01-02 ENCOUNTER — Telehealth: Payer: Self-pay

## 2016-01-02 NOTE — Telephone Encounter (Signed)
PT came by the office with questions about her next colonoscopy.  She had received a letter on RECALL and also PCP had sent referral.  Pt's last colonoscopy was 11/22/2008 by Dr. Gala Romney and he recommended her next in 7-10 years.  She is not having any diarrhea, constipation or rectal bleeding.   She was more concerned with type of prep she would have if she scheduled, since she cannot tolerate a large volume prep.  Since pt was advise for her next 7-10 years and it has been 7 years, please advise.

## 2016-01-05 NOTE — Telephone Encounter (Signed)
Tried to call. Not pt's nuimber.

## 2016-01-05 NOTE — Telephone Encounter (Signed)
Check ifobt. If positive, would proceed with colonoscopy now. If negative, would just check yearly.

## 2016-01-06 NOTE — Telephone Encounter (Signed)
Mailed letter for pt to call.

## 2016-01-12 NOTE — Telephone Encounter (Signed)
PT called and she would like to do the iFOBT.  I am leaving one at the front for her and will give her instructions when she comes by this afternoon to pick it up.

## 2016-01-16 ENCOUNTER — Ambulatory Visit (INDEPENDENT_AMBULATORY_CARE_PROVIDER_SITE_OTHER): Payer: Commercial Managed Care - HMO

## 2016-01-16 DIAGNOSIS — D508 Other iron deficiency anemias: Secondary | ICD-10-CM | POA: Diagnosis not present

## 2016-01-16 LAB — IFOBT (OCCULT BLOOD): IFOBT: POSITIVE

## 2016-01-18 ENCOUNTER — Encounter: Payer: Self-pay | Admitting: Gastroenterology

## 2016-01-18 NOTE — Progress Notes (Signed)
Heme positive. Needs colonoscopy, office visit.

## 2016-01-24 DIAGNOSIS — E538 Deficiency of other specified B group vitamins: Secondary | ICD-10-CM | POA: Diagnosis not present

## 2016-01-26 ENCOUNTER — Ambulatory Visit (HOSPITAL_COMMUNITY)
Admission: RE | Admit: 2016-01-26 | Discharge: 2016-01-26 | Disposition: A | Discharge status: Home / Self Care | Payer: Commercial Managed Care - HMO | Source: Ambulatory Visit | Attending: Family Medicine | Admitting: Family Medicine

## 2016-01-26 DIAGNOSIS — Z1231 Encounter for screening mammogram for malignant neoplasm of breast: Secondary | ICD-10-CM | POA: Diagnosis not present

## 2016-02-08 ENCOUNTER — Encounter: Payer: Self-pay | Admitting: Gastroenterology

## 2016-02-08 ENCOUNTER — Ambulatory Visit (INDEPENDENT_AMBULATORY_CARE_PROVIDER_SITE_OTHER): Payer: Commercial Managed Care - HMO | Admitting: Gastroenterology

## 2016-02-08 DIAGNOSIS — R195 Other fecal abnormalities: Secondary | ICD-10-CM | POA: Insufficient documentation

## 2016-02-08 NOTE — Progress Notes (Signed)
Primary Care Physician:  Glo Herring., MD  Primary Gastroenterologist:  Garfield Cornea, MD   Chief Complaint  Patient presents with  . Blood In Stools    HPI:  Sara Fischer is a 75 y.o. female here for heme positive stools. Her last colonoscopy (only colonoscopy) was done in 2010. She had a sigmoid tubular adenoma removed. She also has pancolonic diverticulosis.  Recent heme positive stool. BM every other day. No rectal bleeding. No abdominal pain. No appetite concerns. Intermittent heartburn. No unintentional weight loss, vomiting, dysphagia. Rarely takes Aleve.  Current Outpatient Prescriptions  Medication Sig Dispense Refill  . aspirin EC 81 MG tablet Take 81 mg by mouth daily.    . Cholecalciferol (D3-1000 PO) Take 2,000 capsules by mouth daily.     . Coenzyme Q10 (CO Q-10) 100 MG CAPS Take 1 capsule by mouth daily.    . Flaxseed, Linseed, (FLAX SEEDS PO) Take 1 capsule by mouth daily.     . furosemide (LASIX) 40 MG tablet Take 40 mg by mouth daily.     Marland Kitchen KRILL OIL PO Take 1 capsule by mouth daily.    . metoprolol succinate (TOPROL-XL) 100 MG 24 hr tablet Take 100 mg by mouth daily. Take with or immediately following a meal.    . simvastatin (ZOCOR) 20 MG tablet Take 20 mg by mouth daily.     No current facility-administered medications for this visit.     Allergies as of 02/08/2016 - Review Complete 02/08/2016  Allergen Reaction Noted  . Feldene [piroxicam]  11/02/2013  . Lodine [etodolac]  11/08/2010    Past Medical History:  Diagnosis Date  . Hypertension     Past Surgical History:  Procedure Laterality Date  . COLONOSCOPY  2010   Dr. Gala Romney: pancolonic diverticulosis, sigmoid colon tubular adenoma removed.   Marland Kitchen KNEE ARTHROSCOPY     bilateral  . LAPAROSCOPIC BILATERAL SALPINGO OOPHERECTOMY  2001  . PARTIAL HYSTERECTOMY  1979    Family History  Problem Relation Age of Onset  . Diabetes Mother   . Heart disease Mother   . Heart disease Father 50  . Colon  cancer Neg Hx     Social History   Social History  . Marital status: Widowed    Spouse name: N/A  . Number of children: N/A  . Years of education: N/A   Occupational History  . Not on file.   Social History Main Topics  . Smoking status: Former Smoker    Packs/day: 0.50    Years: 5.00    Types: Cigarettes    Start date: 08/28/1963    Quit date: 11/02/1968  . Smokeless tobacco: Never Used  . Alcohol use 0.0 oz/week     Comment: wine occas.  . Drug use: Unknown  . Sexual activity: Not on file   Other Topics Concern  . Not on file   Social History Narrative  . No narrative on file      ROS:  General: Negative for anorexia, weight loss, fever, chills, fatigue, weakness. Eyes: Negative for vision changes.  ENT: Negative for hoarseness, difficulty swallowing , nasal congestion. CV: Negative for chest pain, angina, palpitations, dyspnea on exertion, peripheral edema.  Respiratory: Negative for dyspnea at rest, dyspnea on exertion, cough, sputum, wheezing.  GI: See history of present illness. GU:  Negative for dysuria, hematuria, urinary incontinence, urinary frequency, nocturnal urination.  MS: Negative for joint pain, low back pain.  Derm: Negative for rash or itching.  Neuro: Negative for weakness,  abnormal sensation, seizure, frequent headaches, memory loss, confusion.  Psych: Negative for anxiety, depression, suicidal ideation, hallucinations.  Endo: Negative for unusual weight change.  Heme: Negative for bruising or bleeding. Allergy: Negative for rash or hives.    Physical Examination:  BP 133/78   Pulse 82   Temp 97.3 F (36.3 C) (Oral)   Ht 5\' 2"  (1.575 m)   Wt 161 lb 12.8 oz (73.4 kg)   BMI 29.59 kg/m    General: Well-nourished, well-developed in no acute distress.  Head: Normocephalic, atraumatic.   Eyes: Conjunctiva pink, no icterus. Mouth: Oropharyngeal mucosa moist and pink , no lesions erythema or exudate. Neck: Supple without thyromegaly,  masses, or lymphadenopathy.  Lungs: Clear to auscultation bilaterally.  Heart: Regular rate and rhythm, no murmurs rubs or gallops.  Abdomen: Bowel sounds are normal, nontender, nondistended, no hepatosplenomegaly or masses, no abdominal bruits or    hernia , no rebound or guarding.   Rectal: Not performed Extremities: No lower extremity edema. No clubbing or deformities.  Neuro: Alert and oriented x 4 , grossly normal neurologically.  Skin: Warm and dry, no rash or jaundice.   Psych: Alert and cooperative, normal mood and affect.  Labs: Outside labs April 2017, BUN 15, creatinine 0.6. TSH 2.09 in August 2016.  Imaging Studies: Mm Screening Breast Tomo Bilateral  Result Date: 01/31/2016 CLINICAL DATA:  Screening. EXAM: 2D DIGITAL SCREENING BILATERAL MAMMOGRAM WITH CAD AND ADJUNCT TOMO COMPARISON:  Previous exam(s). ACR Breast Density Category b: There are scattered areas of fibroglandular density. FINDINGS: There are no findings suspicious for malignancy. Images were processed with CAD. IMPRESSION: No mammographic evidence of malignancy. A result letter of this screening mammogram will be mailed directly to the patient. RECOMMENDATION: Screening mammogram in one year. (Code:SM-B-01Y) BI-RADS CATEGORY  1: Negative. Electronically Signed   By: Lovey Newcomer M.D.   On: 01/31/2016 07:21

## 2016-02-08 NOTE — Patient Instructions (Signed)
1. Colonoscopy as scheduled. See separate instructions.  

## 2016-02-09 NOTE — Assessment & Plan Note (Signed)
75 year old lady with history of tubular adenoma in 2010. Advised to come back in 7-10 years. Recently came up on recall for 7 year follow-up. Patient opted to perform Hemoccult as she desired to wait 10 years for her next colonoscopy. Hemoccult was positive. She clinically is having no symptoms. Have offered colonoscopy at this time for further evaluation of heme positive stool.  I have discussed the risks, alternatives, benefits with regards to but not limited to the risk of reaction to medication, bleeding, infection, perforation and the patient is agreeable to proceed. Written consent to be obtained.

## 2016-02-10 ENCOUNTER — Other Ambulatory Visit: Payer: Self-pay

## 2016-02-10 DIAGNOSIS — R195 Other fecal abnormalities: Secondary | ICD-10-CM

## 2016-02-10 MED ORDER — NA SULFATE-K SULFATE-MG SULF 17.5-3.13-1.6 GM/177ML PO SOLN: 1.0000 | ORAL | 0 refills | Status: DC

## 2016-02-10 MED ORDER — PEG 3350-KCL-NA BICARB-NACL 420 G PO SOLR: 4000.0000 mL | ORAL | 0 refills | Status: DC

## 2016-02-10 NOTE — Progress Notes (Signed)
cc'ed to pcp °

## 2016-02-23 DIAGNOSIS — E538 Deficiency of other specified B group vitamins: Secondary | ICD-10-CM | POA: Diagnosis not present

## 2016-02-29 ENCOUNTER — Encounter (HOSPITAL_COMMUNITY): Payer: Self-pay | Admitting: *Deleted

## 2016-02-29 ENCOUNTER — Encounter (HOSPITAL_COMMUNITY)
Admission: RE | Disposition: A | Discharge status: Home / Self Care | Payer: Self-pay | Source: Ambulatory Visit | Attending: Internal Medicine

## 2016-02-29 ENCOUNTER — Ambulatory Visit (HOSPITAL_COMMUNITY)
Admission: RE | Admit: 2016-02-29 | Discharge: 2016-02-29 | Disposition: A | Discharge status: Home / Self Care | Payer: Commercial Managed Care - HMO | Source: Ambulatory Visit | Attending: Internal Medicine | Admitting: Internal Medicine

## 2016-02-29 DIAGNOSIS — K921 Melena: Secondary | ICD-10-CM | POA: Insufficient documentation

## 2016-02-29 DIAGNOSIS — Z7982 Long term (current) use of aspirin: Secondary | ICD-10-CM | POA: Diagnosis not present

## 2016-02-29 DIAGNOSIS — R195 Other fecal abnormalities: Secondary | ICD-10-CM | POA: Diagnosis not present

## 2016-02-29 DIAGNOSIS — K5730 Diverticulosis of large intestine without perforation or abscess without bleeding: Secondary | ICD-10-CM | POA: Diagnosis not present

## 2016-02-29 DIAGNOSIS — K648 Other hemorrhoids: Secondary | ICD-10-CM | POA: Diagnosis not present

## 2016-02-29 DIAGNOSIS — Z79899 Other long term (current) drug therapy: Secondary | ICD-10-CM | POA: Insufficient documentation

## 2016-02-29 DIAGNOSIS — Z87891 Personal history of nicotine dependence: Secondary | ICD-10-CM | POA: Insufficient documentation

## 2016-02-29 DIAGNOSIS — I10 Essential (primary) hypertension: Secondary | ICD-10-CM | POA: Insufficient documentation

## 2016-02-29 HISTORY — PX: COLONOSCOPY: SHX5424

## 2016-02-29 LAB — HEMOGLOBIN AND HEMATOCRIT, BLOOD
HCT: 32.9 % — ABNORMAL LOW (ref 36.0–46.0)
Hemoglobin: 11 g/dL — ABNORMAL LOW (ref 12.0–15.0)

## 2016-02-29 SURGERY — COLONOSCOPY
Anesthesia: Moderate Sedation

## 2016-02-29 MED ORDER — SODIUM CHLORIDE 0.9 % IV SOLN
INTRAVENOUS | Status: DC
  Administered 2016-02-29: 14:00:00 via INTRAVENOUS

## 2016-02-29 MED ORDER — MEPERIDINE HCL 100 MG/ML IJ SOLN
INTRAMUSCULAR | Status: AC
  Filled 2016-02-29: qty 2

## 2016-02-29 MED ORDER — ONDANSETRON HCL 4 MG/2ML IJ SOLN
INTRAMUSCULAR | Status: AC
  Filled 2016-02-29: qty 2

## 2016-02-29 MED ORDER — SIMETHICONE 40 MG/0.6ML PO SUSP
ORAL | Status: DC | PRN
  Administered 2016-02-29: 15:00:00

## 2016-02-29 MED ORDER — MIDAZOLAM HCL 5 MG/5ML IJ SOLN
INTRAMUSCULAR | Status: AC
  Filled 2016-02-29: qty 10

## 2016-02-29 MED ORDER — MEPERIDINE HCL 100 MG/ML IJ SOLN
INTRAMUSCULAR | Status: DC | PRN
  Administered 2016-02-29 (×2): 25 mg via INTRAVENOUS

## 2016-02-29 MED ORDER — ONDANSETRON HCL 4 MG/2ML IJ SOLN
INTRAMUSCULAR | Status: DC | PRN
Start: 2016-02-29 — End: 2016-02-29
  Administered 2016-02-29: 4 mg via INTRAVENOUS

## 2016-02-29 MED ORDER — MIDAZOLAM HCL 5 MG/5ML IJ SOLN
INTRAMUSCULAR | Status: DC | PRN
Start: 2016-02-29 — End: 2016-02-29
  Administered 2016-02-29: 1 mg via INTRAVENOUS
  Administered 2016-02-29 (×2): 2 mg via INTRAVENOUS

## 2016-02-29 NOTE — Interval H&P Note (Signed)
History and Physical Interval Note:  02/29/2016 3:03 PM  Sara Fischer  has presented today for surgery, with the diagnosis of blood in stools  The various methods of treatment have been discussed with the patient and family. After consideration of risks, benefits and other options for treatment, the patient has consented to  Procedure(s) with comments: COLONOSCOPY (N/A) - 230 as a surgical intervention .  The patient's history has been reviewed, patient examined, no change in status, stable for surgery.  I have reviewed the patient's chart and labs.  Questions were answered to the patient's satisfaction.     No change. Diagnostic colonoscopy per plan. The risks, benefits, limitations, alternatives and imponderables have been reviewed with the patient. Questions have been answered. All parties are agreeable.

## 2016-02-29 NOTE — H&P (View-Only) (Signed)
Primary Care Physician:  Glo Herring., MD  Primary Gastroenterologist:  Garfield Cornea, MD   Chief Complaint  Patient presents with  . Blood In Stools    HPI:  Sara Fischer is a 75 y.o. female here for heme positive stools. Her last colonoscopy (only colonoscopy) was done in 2010. She had a sigmoid tubular adenoma removed. She also has pancolonic diverticulosis.  Recent heme positive stool. BM every other day. No rectal bleeding. No abdominal pain. No appetite concerns. Intermittent heartburn. No unintentional weight loss, vomiting, dysphagia. Rarely takes Aleve.  Current Outpatient Prescriptions  Medication Sig Dispense Refill  . aspirin EC 81 MG tablet Take 81 mg by mouth daily.    . Cholecalciferol (D3-1000 PO) Take 2,000 capsules by mouth daily.     . Coenzyme Q10 (CO Q-10) 100 MG CAPS Take 1 capsule by mouth daily.    . Flaxseed, Linseed, (FLAX SEEDS PO) Take 1 capsule by mouth daily.     . furosemide (LASIX) 40 MG tablet Take 40 mg by mouth daily.     Marland Kitchen KRILL OIL PO Take 1 capsule by mouth daily.    . metoprolol succinate (TOPROL-XL) 100 MG 24 hr tablet Take 100 mg by mouth daily. Take with or immediately following a meal.    . simvastatin (ZOCOR) 20 MG tablet Take 20 mg by mouth daily.     No current facility-administered medications for this visit.     Allergies as of 02/08/2016 - Review Complete 02/08/2016  Allergen Reaction Noted  . Feldene [piroxicam]  11/02/2013  . Lodine [etodolac]  11/08/2010    Past Medical History:  Diagnosis Date  . Hypertension     Past Surgical History:  Procedure Laterality Date  . COLONOSCOPY  2010   Dr. Gala Romney: pancolonic diverticulosis, sigmoid colon tubular adenoma removed.   Marland Kitchen KNEE ARTHROSCOPY     bilateral  . LAPAROSCOPIC BILATERAL SALPINGO OOPHERECTOMY  2001  . PARTIAL HYSTERECTOMY  1979    Family History  Problem Relation Age of Onset  . Diabetes Mother   . Heart disease Mother   . Heart disease Father 1  . Colon  cancer Neg Hx     Social History   Social History  . Marital status: Widowed    Spouse name: N/A  . Number of children: N/A  . Years of education: N/A   Occupational History  . Not on file.   Social History Main Topics  . Smoking status: Former Smoker    Packs/day: 0.50    Years: 5.00    Types: Cigarettes    Start date: 08/28/1963    Quit date: 11/02/1968  . Smokeless tobacco: Never Used  . Alcohol use 0.0 oz/week     Comment: wine occas.  . Drug use: Unknown  . Sexual activity: Not on file   Other Topics Concern  . Not on file   Social History Narrative  . No narrative on file      ROS:  General: Negative for anorexia, weight loss, fever, chills, fatigue, weakness. Eyes: Negative for vision changes.  ENT: Negative for hoarseness, difficulty swallowing , nasal congestion. CV: Negative for chest pain, angina, palpitations, dyspnea on exertion, peripheral edema.  Respiratory: Negative for dyspnea at rest, dyspnea on exertion, cough, sputum, wheezing.  GI: See history of present illness. GU:  Negative for dysuria, hematuria, urinary incontinence, urinary frequency, nocturnal urination.  MS: Negative for joint pain, low back pain.  Derm: Negative for rash or itching.  Neuro: Negative for weakness,  abnormal sensation, seizure, frequent headaches, memory loss, confusion.  Psych: Negative for anxiety, depression, suicidal ideation, hallucinations.  Endo: Negative for unusual weight change.  Heme: Negative for bruising or bleeding. Allergy: Negative for rash or hives.    Physical Examination:  BP 133/78   Pulse 82   Temp 97.3 F (36.3 C) (Oral)   Ht 5\' 2"  (1.575 m)   Wt 161 lb 12.8 oz (73.4 kg)   BMI 29.59 kg/m    General: Well-nourished, well-developed in no acute distress.  Head: Normocephalic, atraumatic.   Eyes: Conjunctiva pink, no icterus. Mouth: Oropharyngeal mucosa moist and pink , no lesions erythema or exudate. Neck: Supple without thyromegaly,  masses, or lymphadenopathy.  Lungs: Clear to auscultation bilaterally.  Heart: Regular rate and rhythm, no murmurs rubs or gallops.  Abdomen: Bowel sounds are normal, nontender, nondistended, no hepatosplenomegaly or masses, no abdominal bruits or    hernia , no rebound or guarding.   Rectal: Not performed Extremities: No lower extremity edema. No clubbing or deformities.  Neuro: Alert and oriented x 4 , grossly normal neurologically.  Skin: Warm and dry, no rash or jaundice.   Psych: Alert and cooperative, normal mood and affect.  Labs: Outside labs April 2017, BUN 15, creatinine 0.6. TSH 2.09 in August 2016.  Imaging Studies: Mm Screening Breast Tomo Bilateral  Result Date: 01/31/2016 CLINICAL DATA:  Screening. EXAM: 2D DIGITAL SCREENING BILATERAL MAMMOGRAM WITH CAD AND ADJUNCT TOMO COMPARISON:  Previous exam(s). ACR Breast Density Category b: There are scattered areas of fibroglandular density. FINDINGS: There are no findings suspicious for malignancy. Images were processed with CAD. IMPRESSION: No mammographic evidence of malignancy. A result letter of this screening mammogram will be mailed directly to the patient. RECOMMENDATION: Screening mammogram in one year. (Code:SM-B-01Y) BI-RADS CATEGORY  1: Negative. Electronically Signed   By: Lovey Newcomer M.D.   On: 01/31/2016 07:21

## 2016-02-29 NOTE — Discharge Instructions (Addendum)
Colonoscopy Discharge Instructions  Read the instructions outlined below and refer to this sheet in the next few weeks. These discharge instructions provide you with general information on caring for yourself after you leave the hospital. Your doctor may also give you specific instructions. While your treatment has been planned according to the most current medical practices available, unavoidable complications occasionally occur. If you have any problems or questions after discharge, call Dr. Gala Romney at [phone]. ACTIVITY  You may resume your regular activity, but move at a slower pace for the next 24 hours.   Take frequent rest periods for the next 24 hours.   Walking will help get rid of the air and reduce the bloated feeling in your belly (abdomen).   No driving for 24 hours (because of the medicine (anesthesia) used during the test).    Do not sign any important legal documents or operate any machinery for 24 hours (because of the anesthesia used during the test).  NUTRITION  Drink plenty of fluids.   You may resume your normal diet as instructed by your doctor.   Begin with a light meal and progress to your normal diet. Heavy or fried foods are harder to digest and may make you feel sick to your stomach (nauseated).   Avoid alcoholic beverages for 24 hours or as instructed.  MEDICATIONS  You may resume your normal medications unless your doctor tells you otherwise.  WHAT YOU CAN EXPECT TODAY  Some feelings of bloating in the abdomen.   Passage of more gas than usual.   Spotting of blood in your stool or on the toilet paper.  IF YOU HAD POLYPS REMOVED DURING THE COLONOSCOPY:  No aspirin products for 7 days or as instructed.   No alcohol for 7 days or as instructed.   Eat a soft diet for the next 24 hours.  FINDING OUT THE RESULTS OF YOUR TEST Not all test results are available during your visit. If your test results are not back during the visit, make an appointment  with your caregiver to find out the results. Do not assume everything is normal if you have not heard from your caregiver or the medical facility. It is important for you to follow up on all of your test results.  SEEK IMMEDIATE MEDICAL ATTENTION IF:  You have more than a spotting of blood in your stool.   Your belly is swollen (abdominal distention).   You are nauseated or vomiting.   You have a temperature over 101.   You have abdominal pain or discomfort that is severe or gets worse throughout the day.    Diverticulosis information provided  I do not recommend a future colonoscopy was new symptoms develop  H&H today  Further recommendations to follow.    Diverticulosis Diverticulosis is the condition that develops when small pouches (diverticula) form in the wall of your colon. Your colon, or large intestine, is where water is absorbed and stool is formed. The pouches form when the inside layer of your colon pushes through weak spots in the outer layers of your colon. CAUSES  No one knows exactly what causes diverticulosis. RISK FACTORS  Being older than 9. Your risk for this condition increases with age. Diverticulosis is rare in people younger than 40 years. By age 72, almost everyone has it.  Eating a low-fiber diet.  Being frequently constipated.  Being overweight.  Not getting enough exercise.  Smoking.  Taking over-the-counter pain medicines, like aspirin and ibuprofen. SYMPTOMS  Most  people with diverticulosis do not have symptoms. DIAGNOSIS  Because diverticulosis often has no symptoms, health care providers often discover the condition during an exam for other colon problems. In many cases, a health care provider will diagnose diverticulosis while using a flexible scope to examine the colon (colonoscopy). TREATMENT  If you have never developed an infection related to diverticulosis, you may not need treatment. If you have had an infection before, treatment  may include:  Eating more fruits, vegetables, and grains.  Taking a fiber supplement.  Taking a live bacteria supplement (probiotic).  Taking medicine to relax your colon. HOME CARE INSTRUCTIONS   Drink at least 6-8 glasses of water each day to prevent constipation.  Try not to strain when you have a bowel movement.  Keep all follow-up appointments. If you have had an infection before:  Increase the fiber in your diet as directed by your health care provider or dietitian.  Take a dietary fiber supplement if your health care provider approves.  Only take medicines as directed by your health care provider. SEEK MEDICAL CARE IF:   You have abdominal pain.  You have bloating.  You have cramps.  You have not gone to the bathroom in 3 days. SEEK IMMEDIATE MEDICAL CARE IF:   Your pain gets worse.  Yourbloating becomes very bad.  You have a fever or chills, and your symptoms suddenly get worse.  You begin vomiting.  You have bowel movements that are bloody or black. MAKE SURE YOU:  Understand these instructions.  Will watch your condition.  Will get help right away if you are not doing well or get worse. This information is not intended to replace advice given to you by your health care provider. Make sure you discuss any questions you have with your health care provider. Document Released: 11/24/2003 Document Revised: 03/03/2013 Document Reviewed: 01/21/2013 Elsevier Interactive Patient Education  2017 [employer].

## 2016-02-29 NOTE — Op Note (Signed)
[facility] Patient Name: Sara Fischer Procedure Date: 02/29/2016 2:40 PM MRN: [id number] Date of Birth: 1940-11-08 Attending MD: Norvel Richards , MD CSN: [id number] Age: 75 Admit Type: Outpatient Procedure:                Ileo-colonoscopy - diagnostic Indications:              Heme positive stool Providers:                Norvel Richards, MD, Otis Peak B. Sharon Seller, RN,                            Aram Candela, Randa Spike, Technician Referring MD:              Medicines:                Midazolam 2 mg IV, Meperidine 50 mg IV Complications:            No immediate complications. Estimated Blood Loss:     Estimated blood loss: none. Estimated blood loss:                            none. Procedure:                Pre-Anesthesia Assessment:                           - Prior to the procedure, a History and Physical                            was performed, and patient medications and                            allergies were reviewed. The patient's tolerance of                            previous anesthesia was also reviewed. The risks                            and benefits of the procedure and the sedation                            options and risks were discussed with the patient.                            All questions were answered, and informed consent                            was obtained. Prior Anticoagulants: The patient has                            taken no previous anticoagulant or antiplatelet                            agents. ASA Grade Assessment: II - A patient with  mild systemic disease. After reviewing the risks                            and benefits, the patient was deemed in                            satisfactory condition to undergo the procedure.                           After obtaining informed consent, the colonoscope                            was passed under direct vision. Throughout the     procedure, the patient's blood pressure, pulse, and                            oxygen saturations were monitored continuously. The                            EC-[id number] scope was introduced through                            the anus and advanced to the 5 cm into the ileum.                            The colonoscopy was performed without difficulty.                            The patient tolerated the procedure well. The                            quality of the bowel preparation was adequate. The                            terminal ileum, ileocecal valve, appendiceal                            orifice, and rectum were photographed. The terminal                            ileum, ileocecal valve, appendiceal orifice, and                            rectum were photographed. Scope In: 3:11:28 PM Scope Out: 3:27:08 PM Scope Withdrawal Time: 0 hours 11 minutes 45 seconds  Total Procedure Duration: 0 hours 15 minutes 40 seconds  Findings:      Many small and large-mouthed diverticula were found in the entire colon.      Internal hemorrhoids were found. The hemorrhoids were Grade I (internal       hemorrhoids that do not prolapse).      The exam was otherwise without abnormality on direct and retroflexion       views. Impression:               - No specimens collected. Moderate Sedation:  Moderate (conscious) sedation was administered by the endoscopy nurse       and supervised by the endoscopist. The following parameters were       monitored: oxygen saturation, heart rate, blood pressure, respiratory       rate, EKG, adequacy of pulmonary ventilation, and response to care.       Total physician intraservice time was 17 minutes. Recommendation:           - Patient has a contact number available for                            emergencies. The signs and symptoms of potential                            delayed complications were discussed with the                             patient. Return to normal activities tomorrow.                            Written discharge instructions were provided to the                            patient.                           - Resume previous diet.                           - Continue present medications.                           - No repeat colonoscopy due to age.                           - Return to GI clinic (date not yet determined).                            H&H today. Procedure Code(s):        --- Professional ---                           [phone], Colonoscopy, flexible; diagnostic, including                            collection of specimen(s) by brushing or washing,                            when performed (separate procedure)                           99152, Moderate sedation services provided by the                            same physician or other qualified health care  professional performing the diagnostic or                            therapeutic service that the sedation supports,                            requiring the presence of an independent trained                            observer to assist in the monitoring of the                            patient's level of consciousness and physiological                            status; initial 15 minutes of intraservice time,                            patient age 10 years or older Diagnosis Code(s):        --- Professional ---                           R19.5, Other fecal abnormalities CPT copyright 2016 [employer]. All rights reserved. The codes documented in this report are preliminary and upon coder review may  be revised to meet current compliance requirements. Cristopher Estimable. Sloan Galentine, MD Norvel Richards, MD 02/29/2016 3:38:44 PM This report has been signed electronically. Number of Addenda: 0

## 2016-03-06 ENCOUNTER — Encounter (HOSPITAL_COMMUNITY): Payer: Self-pay | Admitting: Internal Medicine

## 2016-03-26 DIAGNOSIS — E538 Deficiency of other specified B group vitamins: Secondary | ICD-10-CM | POA: Diagnosis not present

## 2016-04-18 DIAGNOSIS — H04123 Dry eye syndrome of bilateral lacrimal glands: Secondary | ICD-10-CM | POA: Diagnosis not present

## 2016-04-18 DIAGNOSIS — H43813 Vitreous degeneration, bilateral: Secondary | ICD-10-CM | POA: Diagnosis not present

## 2016-04-18 DIAGNOSIS — H26493 Other secondary cataract, bilateral: Secondary | ICD-10-CM | POA: Diagnosis not present

## 2016-04-18 DIAGNOSIS — Z961 Presence of intraocular lens: Secondary | ICD-10-CM | POA: Diagnosis not present

## 2016-04-18 DIAGNOSIS — H10413 Chronic giant papillary conjunctivitis, bilateral: Secondary | ICD-10-CM | POA: Diagnosis not present

## 2016-04-19 DIAGNOSIS — E538 Deficiency of other specified B group vitamins: Secondary | ICD-10-CM | POA: Diagnosis not present

## 2016-04-19 DIAGNOSIS — Z0001 Encounter for general adult medical examination with abnormal findings: Secondary | ICD-10-CM | POA: Diagnosis not present

## 2016-04-19 DIAGNOSIS — Z6830 Body mass index (BMI) 30.0-30.9, adult: Secondary | ICD-10-CM | POA: Diagnosis not present

## 2016-04-19 DIAGNOSIS — E6609 Other obesity due to excess calories: Secondary | ICD-10-CM | POA: Diagnosis not present

## 2016-04-19 DIAGNOSIS — Z1389 Encounter for screening for other disorder: Secondary | ICD-10-CM | POA: Diagnosis not present

## 2016-04-19 DIAGNOSIS — E782 Mixed hyperlipidemia: Secondary | ICD-10-CM | POA: Diagnosis not present

## 2016-04-19 DIAGNOSIS — M79602 Pain in left arm: Secondary | ICD-10-CM | POA: Diagnosis not present

## 2016-04-27 DIAGNOSIS — E538 Deficiency of other specified B group vitamins: Secondary | ICD-10-CM | POA: Diagnosis not present

## 2016-04-27 DIAGNOSIS — E871 Hypo-osmolality and hyponatremia: Secondary | ICD-10-CM | POA: Diagnosis not present

## 2016-05-25 DIAGNOSIS — E538 Deficiency of other specified B group vitamins: Secondary | ICD-10-CM | POA: Diagnosis not present

## 2016-06-26 DIAGNOSIS — E538 Deficiency of other specified B group vitamins: Secondary | ICD-10-CM | POA: Diagnosis not present

## 2016-07-26 DIAGNOSIS — E538 Deficiency of other specified B group vitamins: Secondary | ICD-10-CM | POA: Diagnosis not present

## 2016-08-23 DIAGNOSIS — E538 Deficiency of other specified B group vitamins: Secondary | ICD-10-CM | POA: Diagnosis not present

## 2016-09-05 DIAGNOSIS — Z85828 Personal history of other malignant neoplasm of skin: Secondary | ICD-10-CM | POA: Diagnosis not present

## 2016-09-05 DIAGNOSIS — Z85820 Personal history of malignant melanoma of skin: Secondary | ICD-10-CM | POA: Diagnosis not present

## 2016-09-05 DIAGNOSIS — L570 Actinic keratosis: Secondary | ICD-10-CM | POA: Diagnosis not present

## 2016-09-25 DIAGNOSIS — E538 Deficiency of other specified B group vitamins: Secondary | ICD-10-CM | POA: Diagnosis not present

## 2016-09-27 DIAGNOSIS — H6123 Impacted cerumen, bilateral: Secondary | ICD-10-CM | POA: Diagnosis not present

## 2016-09-27 DIAGNOSIS — Z6829 Body mass index (BMI) 29.0-29.9, adult: Secondary | ICD-10-CM | POA: Diagnosis not present

## 2016-10-25 DIAGNOSIS — E538 Deficiency of other specified B group vitamins: Secondary | ICD-10-CM | POA: Diagnosis not present

## 2016-11-26 DIAGNOSIS — Z23 Encounter for immunization: Secondary | ICD-10-CM | POA: Diagnosis not present

## 2016-11-26 DIAGNOSIS — E538 Deficiency of other specified B group vitamins: Secondary | ICD-10-CM | POA: Diagnosis not present

## 2016-12-13 ENCOUNTER — Other Ambulatory Visit (HOSPITAL_COMMUNITY): Payer: Self-pay | Admitting: Internal Medicine

## 2016-12-13 DIAGNOSIS — Z1231 Encounter for screening mammogram for malignant neoplasm of breast: Secondary | ICD-10-CM

## 2016-12-24 DIAGNOSIS — E538 Deficiency of other specified B group vitamins: Secondary | ICD-10-CM | POA: Diagnosis not present

## 2016-12-25 ENCOUNTER — Inpatient Hospital Stay (HOSPITAL_COMMUNITY)
Admission: EM | Admit: 2016-12-25 | Discharge: 2016-12-29 | DRG: 355 | Disposition: A | Discharge status: Home / Self Care | Payer: Medicare HMO | Attending: General Surgery | Admitting: General Surgery

## 2016-12-25 ENCOUNTER — Encounter (HOSPITAL_COMMUNITY): Payer: Self-pay | Admitting: *Deleted

## 2016-12-25 ENCOUNTER — Emergency Department (HOSPITAL_COMMUNITY): Payer: Medicare HMO

## 2016-12-25 DIAGNOSIS — D72829 Elevated white blood cell count, unspecified: Secondary | ICD-10-CM | POA: Diagnosis not present

## 2016-12-25 DIAGNOSIS — R1110 Vomiting, unspecified: Secondary | ICD-10-CM | POA: Diagnosis not present

## 2016-12-25 DIAGNOSIS — Z7982 Long term (current) use of aspirin: Secondary | ICD-10-CM

## 2016-12-25 DIAGNOSIS — I10 Essential (primary) hypertension: Secondary | ICD-10-CM | POA: Diagnosis not present

## 2016-12-25 DIAGNOSIS — Z9049 Acquired absence of other specified parts of digestive tract: Secondary | ICD-10-CM | POA: Diagnosis not present

## 2016-12-25 DIAGNOSIS — E785 Hyperlipidemia, unspecified: Secondary | ICD-10-CM | POA: Diagnosis present

## 2016-12-25 DIAGNOSIS — K5730 Diverticulosis of large intestine without perforation or abscess without bleeding: Secondary | ICD-10-CM | POA: Diagnosis present

## 2016-12-25 DIAGNOSIS — Z886 Allergy status to analgesic agent status: Secondary | ICD-10-CM

## 2016-12-25 DIAGNOSIS — Z791 Long term (current) use of non-steroidal anti-inflammatories (NSAID): Secondary | ICD-10-CM

## 2016-12-25 DIAGNOSIS — Z79899 Other long term (current) drug therapy: Secondary | ICD-10-CM

## 2016-12-25 DIAGNOSIS — K432 Incisional hernia without obstruction or gangrene: Secondary | ICD-10-CM | POA: Diagnosis not present

## 2016-12-25 DIAGNOSIS — R112 Nausea with vomiting, unspecified: Secondary | ICD-10-CM | POA: Diagnosis not present

## 2016-12-25 DIAGNOSIS — Z4659 Encounter for fitting and adjustment of other gastrointestinal appliance and device: Secondary | ICD-10-CM

## 2016-12-25 DIAGNOSIS — K439 Ventral hernia without obstruction or gangrene: Secondary | ICD-10-CM | POA: Diagnosis not present

## 2016-12-25 DIAGNOSIS — K460 Unspecified abdominal hernia with obstruction, without gangrene: Secondary | ICD-10-CM | POA: Diagnosis not present

## 2016-12-25 DIAGNOSIS — K430 Incisional hernia with obstruction, without gangrene: Secondary | ICD-10-CM | POA: Diagnosis not present

## 2016-12-25 DIAGNOSIS — Z4682 Encounter for fitting and adjustment of non-vascular catheter: Secondary | ICD-10-CM | POA: Diagnosis not present

## 2016-12-25 DIAGNOSIS — K56609 Unspecified intestinal obstruction, unspecified as to partial versus complete obstruction: Secondary | ICD-10-CM | POA: Diagnosis present

## 2016-12-25 DIAGNOSIS — R109 Unspecified abdominal pain: Secondary | ICD-10-CM | POA: Diagnosis not present

## 2016-12-25 DIAGNOSIS — Z87891 Personal history of nicotine dependence: Secondary | ICD-10-CM

## 2016-12-25 DIAGNOSIS — Z888 Allergy status to other drugs, medicaments and biological substances status: Secondary | ICD-10-CM

## 2016-12-25 HISTORY — DX: Pure hypercholesterolemia, unspecified: E78.00

## 2016-12-25 LAB — URINALYSIS, ROUTINE W REFLEX MICROSCOPIC
Bilirubin Urine: NEGATIVE
GLUCOSE, UA: NEGATIVE mg/dL
Hgb urine dipstick: NEGATIVE
KETONES UR: 20 mg/dL — AB
NITRITE: NEGATIVE
PROTEIN: 30 mg/dL — AB
Specific Gravity, Urine: 1.026 (ref 1.005–1.030)
pH: 5 (ref 5.0–8.0)

## 2016-12-25 LAB — CBC
HEMATOCRIT: 40.9 % (ref 36.0–46.0)
HEMOGLOBIN: 13.9 g/dL (ref 12.0–15.0)
MCH: 31.3 pg (ref 26.0–34.0)
MCHC: 34 g/dL (ref 30.0–36.0)
MCV: 92.1 fL (ref 78.0–100.0)
Platelets: 222 10*3/uL (ref 150–400)
RBC: 4.44 MIL/uL (ref 3.87–5.11)
RDW: 12.8 % (ref 11.5–15.5)
WBC: 13.4 10*3/uL — AB (ref 4.0–10.5)

## 2016-12-25 LAB — COMPREHENSIVE METABOLIC PANEL
ALT: 35 U/L (ref 14–54)
ANION GAP: 13 (ref 5–15)
AST: 42 U/L — AB (ref 15–41)
Albumin: 4.7 g/dL (ref 3.5–5.0)
Alkaline Phosphatase: 117 U/L (ref 38–126)
BUN: 21 mg/dL — AB (ref 6–20)
CHLORIDE: 102 mmol/L (ref 101–111)
CO2: 25 mmol/L (ref 22–32)
Calcium: 10.7 mg/dL — ABNORMAL HIGH (ref 8.9–10.3)
Creatinine, Ser: 0.73 mg/dL (ref 0.44–1.00)
GFR calc Af Amer: >60 mL/min (ref >60)
Glucose, Bld: 151 mg/dL — ABNORMAL HIGH (ref 65–99)
POTASSIUM: 4.1 mmol/L (ref 3.5–5.1)
Sodium: 140 mmol/L (ref 135–145)
Total Bilirubin: 1.1 mg/dL (ref 0.3–1.2)
Total Protein: 8.2 g/dL — ABNORMAL HIGH (ref 6.5–8.1)

## 2016-12-25 LAB — TROPONIN I

## 2016-12-25 LAB — LIPASE, BLOOD: LIPASE: 20 U/L (ref 11–51)

## 2016-12-25 MED ORDER — ONDANSETRON 4 MG PO TBDP
4.0000 mg | ORAL_TABLET | Freq: Once | ORAL | Status: AC | PRN
  Administered 2016-12-25: 4 mg via ORAL
  Filled 2016-12-25: qty 1

## 2016-12-25 MED ORDER — IOPAMIDOL (ISOVUE-300) INJECTION 61%
100.0000 mL | Freq: Once | INTRAVENOUS | Status: AC | PRN
  Administered 2016-12-25: 100 mL via INTRAVENOUS

## 2016-12-25 MED ORDER — HYDROMORPHONE HCL 1 MG/ML IJ SOLN
0.5000 mg | Freq: Once | INTRAMUSCULAR | Status: DC
Start: 2016-12-25 — End: 2016-12-25
  Filled 2016-12-25: qty 1

## 2016-12-25 MED ORDER — HYDROMORPHONE HCL 1 MG/ML IJ SOLN
0.5000 mg | Freq: Once | INTRAMUSCULAR | Status: AC
  Administered 2016-12-25: 0.5 mg via INTRAVENOUS

## 2016-12-25 MED ORDER — ONDANSETRON HCL 4 MG/2ML IJ SOLN
4.0000 mg | Freq: Once | INTRAMUSCULAR | Status: AC
  Administered 2016-12-25: 4 mg via INTRAVENOUS
  Filled 2016-12-25: qty 2

## 2016-12-25 NOTE — ED Triage Notes (Signed)
Pt c/o abd pain with n/v that started two hours ago,

## 2016-12-25 NOTE — ED Provider Notes (Signed)
[facility] EMERGENCY DEPARTMENT Provider Note   CSN: [id number] Arrival date & time: 12/25/16  2148     History   Chief Complaint Chief Complaint  Patient presents with  . Abdominal Pain    HPI Sara Fischer is a 76 y.o. female.  Patient is a 76 year old female who presents to the emergency department with a complaint of abdominal pain.  The patient states that she had been doing fine until approximately 4:30 PM today. She began to have sharp controlling type pain mostly around her bellybutton, but also involving the lower abdomen area. The patient states she's not had any injury or trauma. She's not had any procedures done recently involving her abdomen. No fever or chills reported. No constipation. She states she had very mild diarrhea late this afternoon. His been no significant changes in her diet or medications. The patient states she uses low-dose aspirin as well as an occasional Aleve, but not on a regular consistent basis. She denies use of alcohol. It is of note that she had a hysterectomy in 1979 at which time she also had her appendix removed. She's not had any chest pain related to this abdominal pain. There's been no loss of consciousness reported. The patient is not noticed any change in her stool, in particular the been no black or tarry stool, and no blood noted in the stool recently.   The history is provided by the patient.  Abdominal Pain   Associated symptoms include diarrhea. Pertinent negatives include dysuria, frequency, hematuria and arthralgias.    Past Medical History:  Diagnosis Date  . High cholesterol   . Hypertension     Patient Active Problem List   Diagnosis Date Noted  . Heme positive stool 02/08/2016    Past Surgical History:  Procedure Laterality Date  . COLONOSCOPY  2010   Dr. Gala Romney: pancolonic diverticulosis, sigmoid colon tubular adenoma removed.   . COLONOSCOPY N/A 02/29/2016   Procedure: COLONOSCOPY;  Surgeon: Daneil Dolin, MD;   Location: [facility];  Service: Endoscopy;  Laterality: N/A;  230  . KNEE ARTHROSCOPY     bilateral  . LAPAROSCOPIC BILATERAL SALPINGO OOPHERECTOMY  2001  . PARTIAL HYSTERECTOMY  1979    OB History    No data available       Home Medications    Prior to Admission medications   Medication Sig Start Date End Date Taking? Authorizing Provider  aspirin EC 81 MG tablet Take 81 mg by mouth daily.    [provider]  Cholecalciferol (D3-1000 PO) Take 1,000 capsules by mouth daily.     [provider]  Coenzyme Q10 (CO Q-10) 100 MG CAPS Take 100 mg by mouth daily.     [provider]  Flaxseed, Linseed, (FLAX SEEDS PO) Take 1 capsule by mouth daily.     [provider]  fluticasone (FLONASE) 50 MCG/ACT nasal spray Place 1 spray into both nostrils daily as needed for allergies or rhinitis.    [provider]  furosemide (LASIX) 40 MG tablet Take 40 mg by mouth daily as needed for fluid or edema.     [provider]  KRILL OIL PO Take 1 capsule by mouth daily.    [provider]  metoprolol succinate (TOPROL-XL) 100 MG 24 hr tablet Take 100 mg by mouth daily. Take with or immediately following a meal.    [provider]  Na Sulfate-K Sulfate-Mg Sulf (SUPREP BOWEL PREP KIT) 17.5-3.13-1.6 GM/180ML SOLN Take 1 kit  by mouth as directed. 02/10/16   Rourk, Cristopher Estimable, MD  naproxen sodium (ANAPROX) 220 MG tablet Take 220 mg by mouth daily as needed (pain).    [provider]  simvastatin (ZOCOR) 20 MG tablet Take 20 mg by mouth at bedtime.     [provider]    Family History Family History  Problem Relation Age of Onset  . Diabetes Mother   . Heart disease Mother   . Heart disease Father 35  . Colon cancer Neg Hx     Social History Social History  Substance Use Topics  . Smoking status: Former Smoker    Packs/day: 0.50    Years: 5.00    Types: Cigarettes    Start date: 08/28/1963    Quit date:  11/02/1968  . Smokeless tobacco: Never Used  . Alcohol use 0.0 oz/week     Comment: wine occas.     Allergies   Feldene [piroxicam] and Lodine [etodolac]   Review of Systems Review of Systems  Constitutional: Negative for activity change.       All ROS Neg except as noted in HPI  HENT: Negative for nosebleeds.   Eyes: Negative for photophobia and discharge.  Respiratory: Negative for cough, shortness of breath and wheezing.   Cardiovascular: Negative for chest pain and palpitations.  Gastrointestinal: Positive for abdominal pain and diarrhea. Negative for blood in stool.  Genitourinary: Negative for dysuria, frequency and hematuria.  Musculoskeletal: Negative for arthralgias, back pain and neck pain.  Skin: Negative.   Neurological: Negative for dizziness, seizures and speech difficulty.  Psychiatric/Behavioral: Negative for confusion and hallucinations.     Physical Exam Updated Vital Signs BP (!) 211/90 (BP Location: Left Arm)   Pulse 84   Temp 98.7 F (37.1 C) (Oral)   Resp 16   Wt 74.5 kg (164 lb 5 oz)   SpO2 97%   BMI 30.05 kg/m   Physical Exam  Constitutional: She is oriented to person, place, and time. She appears well-developed and well-nourished.  Non-toxic appearance.  HENT:  Head: Normocephalic.  Right Ear: Tympanic membrane and external ear normal.  Left Ear: Tympanic membrane and external ear normal.  Eyes: Pupils are equal, round, and reactive to light. EOM and lids are normal.  Neck: Normal range of motion. Neck supple. Carotid bruit is not present.  Cardiovascular: Normal rate, regular rhythm, normal heart sounds, intact distal pulses and normal pulses.   Pulmonary/Chest: Breath sounds normal. No respiratory distress.  Abdominal: Soft. Bowel sounds are normal. She exhibits no distension, no pulsatile midline mass and no mass. There is no splenomegaly or hepatomegaly. There is tenderness in the right lower quadrant, periumbilical area and suprapubic  area. There is no guarding. A hernia is present.  Lower abd hernia noted.  Musculoskeletal: Normal range of motion.  Lymphadenopathy:       Head (right side): No submandibular adenopathy present.       Head (left side): No submandibular adenopathy present.    She has no cervical adenopathy.  Neurological: She is alert and oriented to person, place, and time. She has normal strength. No cranial nerve deficit or sensory deficit.  Skin: Skin is warm and dry.  Psychiatric: She has a normal mood and affect. Her speech is normal.  Nursing note and vitals reviewed.    ED Treatments / Results  Labs (all labs ordered are listed, but only abnormal results are displayed) Labs Reviewed  COMPREHENSIVE METABOLIC PANEL - Abnormal; Notable for the following:  Result Value   Glucose, Bld 151 (*)    BUN 21 (*)    Calcium 10.7 (*)    Total Protein 8.2 (*)    AST 42 (*)    All other components within normal limits  CBC - Abnormal; Notable for the following:    WBC 13.4 (*)    All other components within normal limits  URINALYSIS, ROUTINE W REFLEX MICROSCOPIC - Abnormal; Notable for the following:    Color, Urine AMBER (*)    APPearance CLOUDY (*)    Ketones, ur 20 (*)    Protein, ur 30 (*)    Leukocytes, UA LARGE (*)    Bacteria, UA RARE (*)    Squamous Epithelial / LPF 0-5 (*)    All other components within normal limits  URINE CULTURE  LIPASE, BLOOD  TROPONIN I    EKG  EKG Interpretation None       Radiology No results found.  Procedures Hernia reduction Date/Time: 12/26/2016 1:15 AM Performed by: Lily Kocher Authorized by: Lily Kocher  Consent: Verbal consent obtained. Risks and benefits: risks, benefits and alternatives were discussed Consent given by: patient Patient understanding: patient states understanding of the procedure being performed Test results: test results available and properly labeled Imaging studies: imaging studies available Required items:  required blood products, implants, devices, and special equipment available Patient identity confirmed: arm band Time out: Immediately prior to procedure a "time out" was called to verify the correct patient, procedure, equipment, support staff and site/side marked as required. Preparation: pt placed in trendelenburg. Local anesthesia used: no  Anesthesia: Local anesthesia used: no  Sedation: Patient sedated: IV dilaudid. Patient tolerance: Patient tolerated the procedure well with no immediate complications Comments: Lower abd. Hernia reduced without problem.    (including critical care time)  Medications Ordered in ED Medications  ondansetron (ZOFRAN-ODT) disintegrating tablet 4 mg (4 mg Oral Given 12/25/16 2159)  ondansetron (ZOFRAN) injection 4 mg (4 mg Intravenous Given 12/25/16 2322)  HYDROmorphone (DILAUDID) injection 0.5 mg (0.5 mg Intravenous Given 12/25/16 2326)     Initial Impression / Assessment and Plan / ED Course  I have reviewed the triage vital signs and the nursing notes.  Pertinent labs & imaging results that were available during my care of the patient were reviewed by me and considered in my medical decision making (see chart for details).     Pt seen with me by Dr Christy Gentles.  Final Clinical Impressions(s) / ED Diagnoses MdM Vital signs reviewed.  Patient will be evaluated for possible urinary tract infection or kidney stone. Also within the differential diagnosis would include peptic ulcer disease aneurysm, viral illness, small bowel obstruction, and diverticulitis.  Patient feeling some better after a small dose of IV Dilaudid. No further vomiting since IV Zofran given. Her graft lipase is normal at 20. Competence of metabolic panel shows the BUN be slightly elevated at 21, the AST is slightly elevated at 42, otherwise the comprehensive metabolic panel is within normal limits. The anion gap is 13. The complete blood count shows a leukocytosis with a white  blood cell count of 13,400. There is no noted shift to the left. Troponin is negative for an acute event. The urinalysis showed a cloudy amber specimen with a specific gravity 1.026. There are ketones and proteins present. The large leukocyte esterase present. This 6-30 white blood cells present also. CT scan of the abdomen shows the diffuse fluid fill on areas of the small bowel. There is some on distention  of the proximal small bowel. There is an anterior abdominal wall hernia in the lower pelvis area containing multiple loops of small bowel. Consistent with a small bowel obstruction.  Case discussed with Dr. Arnoldo Morale. He suggested attempt at reducing the hernia. He will consult on the patient. Lower abd hernia successfully reduced by me.   Case discussed with the [facility] hospitalist. They will admit the patient.   Final diagnoses:  SBO (small bowel obstruction) ([place])  Abdominal wall hernia    New Prescriptions New Prescriptions   No medications on file     Lily Kocher, Hershal Coria 12/26/16 Alfredia Ferguson, MD 12/26/16 [id number]

## 2016-12-26 ENCOUNTER — Inpatient Hospital Stay (HOSPITAL_COMMUNITY): Payer: Medicare HMO

## 2016-12-26 ENCOUNTER — Encounter (HOSPITAL_COMMUNITY): Payer: Self-pay | Admitting: Internal Medicine

## 2016-12-26 DIAGNOSIS — Z87891 Personal history of nicotine dependence: Secondary | ICD-10-CM | POA: Diagnosis not present

## 2016-12-26 DIAGNOSIS — Z888 Allergy status to other drugs, medicaments and biological substances status: Secondary | ICD-10-CM | POA: Diagnosis not present

## 2016-12-26 DIAGNOSIS — Z791 Long term (current) use of non-steroidal anti-inflammatories (NSAID): Secondary | ICD-10-CM | POA: Diagnosis not present

## 2016-12-26 DIAGNOSIS — Z7982 Long term (current) use of aspirin: Secondary | ICD-10-CM | POA: Diagnosis not present

## 2016-12-26 DIAGNOSIS — I10 Essential (primary) hypertension: Secondary | ICD-10-CM

## 2016-12-26 DIAGNOSIS — K56609 Unspecified intestinal obstruction, unspecified as to partial versus complete obstruction: Secondary | ICD-10-CM

## 2016-12-26 DIAGNOSIS — K430 Incisional hernia with obstruction, without gangrene: Secondary | ICD-10-CM | POA: Diagnosis present

## 2016-12-26 DIAGNOSIS — K439 Ventral hernia without obstruction or gangrene: Secondary | ICD-10-CM | POA: Diagnosis not present

## 2016-12-26 DIAGNOSIS — D72829 Elevated white blood cell count, unspecified: Secondary | ICD-10-CM | POA: Diagnosis present

## 2016-12-26 DIAGNOSIS — E785 Hyperlipidemia, unspecified: Secondary | ICD-10-CM | POA: Diagnosis present

## 2016-12-26 DIAGNOSIS — K5730 Diverticulosis of large intestine without perforation or abscess without bleeding: Secondary | ICD-10-CM | POA: Diagnosis present

## 2016-12-26 DIAGNOSIS — Z79899 Other long term (current) drug therapy: Secondary | ICD-10-CM | POA: Diagnosis not present

## 2016-12-26 DIAGNOSIS — Z886 Allergy status to analgesic agent status: Secondary | ICD-10-CM | POA: Diagnosis not present

## 2016-12-26 DIAGNOSIS — Z9049 Acquired absence of other specified parts of digestive tract: Secondary | ICD-10-CM | POA: Diagnosis not present

## 2016-12-26 LAB — COMPREHENSIVE METABOLIC PANEL
ALT: 27 U/L (ref 14–54)
ANION GAP: 8 (ref 5–15)
AST: 31 U/L (ref 15–41)
Albumin: 3.8 g/dL (ref 3.5–5.0)
Alkaline Phosphatase: 88 U/L (ref 38–126)
BILIRUBIN TOTAL: 0.7 mg/dL (ref 0.3–1.2)
BUN: 20 mg/dL (ref 6–20)
CO2: 27 mmol/L (ref 22–32)
Calcium: 9 mg/dL (ref 8.9–10.3)
Chloride: 107 mmol/L (ref 101–111)
Creatinine, Ser: 0.58 mg/dL (ref 0.44–1.00)
GFR calc Af Amer: >60 mL/min (ref >60)
Glucose, Bld: 130 mg/dL — ABNORMAL HIGH (ref 65–99)
POTASSIUM: 3.7 mmol/L (ref 3.5–5.1)
Sodium: 142 mmol/L (ref 135–145)
TOTAL PROTEIN: 6.8 g/dL (ref 6.5–8.1)

## 2016-12-26 LAB — CBC
HEMATOCRIT: 35.7 % — AB (ref 36.0–46.0)
Hemoglobin: 11.9 g/dL — ABNORMAL LOW (ref 12.0–15.0)
MCH: 31 pg (ref 26.0–34.0)
MCHC: 33.3 g/dL (ref 30.0–36.0)
MCV: 93 fL (ref 78.0–100.0)
Platelets: 204 10*3/uL (ref 150–400)
RBC: 3.84 MIL/uL — ABNORMAL LOW (ref 3.87–5.11)
RDW: 13.1 % (ref 11.5–15.5)
WBC: 10.7 10*3/uL — AB (ref 4.0–10.5)

## 2016-12-26 MED ORDER — SODIUM CHLORIDE 0.9 % IV BOLUS (SEPSIS)
500.0000 mL | Freq: Once | INTRAVENOUS | Status: AC
Start: 2016-12-26 — End: 2016-12-26
  Administered 2016-12-26: 500 mL via INTRAVENOUS

## 2016-12-26 MED ORDER — ONDANSETRON HCL 4 MG/2ML IJ SOLN: 4.0000 mg | Freq: Four times a day (QID) | INTRAMUSCULAR | Status: DC | PRN

## 2016-12-26 MED ORDER — SIMVASTATIN 20 MG PO TABS
20.0000 mg | ORAL_TABLET | Freq: Every day | ORAL | Status: DC
  Administered 2016-12-26 – 2016-12-28 (×3): 20 mg via ORAL
  Filled 2016-12-26 (×2): qty 2
  Filled 2016-12-26: qty 1

## 2016-12-26 MED ORDER — HYDROMORPHONE HCL 1 MG/ML IJ SOLN
1.0000 mg | Freq: Once | INTRAMUSCULAR | Status: AC
  Administered 2016-12-26: 1 mg via INTRAVENOUS
  Filled 2016-12-26: qty 1

## 2016-12-26 MED ORDER — HYDROMORPHONE HCL 1 MG/ML IJ SOLN: 1.0000 mg | INTRAMUSCULAR | Status: DC | PRN

## 2016-12-26 MED ORDER — SODIUM CHLORIDE 0.9 % IV SOLN
1000.0000 mL | INTRAVENOUS | Status: DC
  Administered 2016-12-26: 1000 mL via INTRAVENOUS

## 2016-12-26 MED ORDER — ENOXAPARIN SODIUM 40 MG/0.4ML ~~LOC~~ SOLN
40.0000 mg | SUBCUTANEOUS | Status: DC
  Administered 2016-12-26 – 2016-12-29 (×4): 40 mg via SUBCUTANEOUS
  Filled 2016-12-26 (×4): qty 0.4

## 2016-12-26 MED ORDER — FLUTICASONE PROPIONATE 50 MCG/ACT NA SUSP: 1.0000 | Freq: Every day | NASAL | Status: DC | PRN

## 2016-12-26 MED ORDER — PHENOL 1.4 % MT LIQD: 1.0000 | OROMUCOSAL | Status: DC | PRN

## 2016-12-26 MED ORDER — SODIUM CHLORIDE 0.9 % IV SOLN
INTRAVENOUS | Status: AC
  Administered 2016-12-26 (×2): via INTRAVENOUS

## 2016-12-26 MED ORDER — METOPROLOL SUCCINATE ER 50 MG PO TB24
100.0000 mg | ORAL_TABLET | Freq: Every day | ORAL | Status: DC
  Administered 2016-12-26 – 2016-12-29 (×4): 100 mg via ORAL
  Filled 2016-12-26 (×4): qty 2
  Filled 2016-12-26: qty 1

## 2016-12-26 NOTE — Consult Note (Signed)
Reason for Consult:small bowel obstruction, abdominal wall hernia Referring Physician: Dr. Ermalene Searing Sara Fischer is an 76 y.o. female.  HPI: patient is a 76 year old white female who presented to the emergency room yesterday evening with nausea and vomiting. She also had mild abdominal pain. CT scan of the abdomen revealed a small bowel obstruction secondary to a lower abdominal ventral hernia. Patient states that this is her first episode of having nausea and vomiting. She has had swelling along the lower abdomenfor some time now. She currently has no pain. She has not passed flatus or had a bowel movement yet while in the hospital.  Past Medical History:  Diagnosis Date  . High cholesterol   . Hypertension     Past Surgical History:  Procedure Laterality Date  . APPENDECTOMY    . COLONOSCOPY  2010   Dr. Gala Romney: pancolonic diverticulosis, sigmoid colon tubular adenoma removed.   . COLONOSCOPY N/A 02/29/2016   Procedure: COLONOSCOPY;  Surgeon: Daneil Dolin, MD;  Location[facility];  Service: Endoscopy;  Laterality: N/A;  230  . KNEE ARTHROSCOPY     bilateral  . LAPAROSCOPIC BILATERAL SALPINGO OOPHERECTOMY  2001  . PARTIAL HYSTERECTOMY  1979    Family History  Problem Relation Age of Onset  . Diabetes Mother   . Heart disease Mother   . Heart disease Father 85  . Colon cancer Neg Hx     Social History:  reports that she quit smoking about 48 years ago. Her smoking use included Cigarettes. She started smoking about 53 years ago. She has a 2.50 pack-year smoking history. She has never used smokeless tobacco. She reports that she drinks alcohol. She reports that she does not use drugs.  Allergies:  Allergies  Allergen Reactions  . Feldene [Piroxicam]     Headaches   . Lodine [Etodolac]     headaches    Medications:  Scheduled: . enoxaparin (LOVENOX) injection  40 mg Subcutaneous Q24H  . metoprolol succinate  100 mg Oral Daily  . simvastatin  20 mg Oral QHS     Results for orders placed or performed during the hospital encounter of 12/25/16 (from the past 48 hour(s))  Urinalysis, Routine w reflex microscopic     Status: Abnormal   Collection Time: 12/25/16  9:57 PM  Result Value Ref Range   Color, Urine AMBER (A) YELLOW    Comment: BIOCHEMICALS MAY BE AFFECTED BY COLOR   APPearance CLOUDY (A) CLEAR   Specific Gravity, Urine 1.026 1.005 - 1.030   pH 5.0 5.0 - 8.0   Glucose, UA NEGATIVE NEGATIVE mg/dL   Hgb urine dipstick NEGATIVE NEGATIVE   Bilirubin Urine NEGATIVE NEGATIVE   Ketones, ur 20 (A) NEGATIVE mg/dL   Protein, ur 30 (A) NEGATIVE mg/dL   Nitrite NEGATIVE NEGATIVE   Leukocytes, UA LARGE (A) NEGATIVE   RBC / HPF 6-30 0 - 5 RBC/hpf   WBC, UA 6-30 0 - 5 WBC/hpf   Bacteria, UA RARE (A) NONE SEEN   Squamous Epithelial / LPF 0-5 (A) NONE SEEN   Mucus PRESENT    Hyaline Casts, UA PRESENT    Ca Oxalate Crys, UA PRESENT   Lipase, blood     Status: None   Collection Time: 12/25/16 10:12 PM  Result Value Ref Range   Lipase 20 11 - 51 U/L  Comprehensive metabolic panel     Status: Abnormal   Collection Time: 12/25/16 10:12 PM  Result Value Ref Range   Sodium 140 135 -  145 mmol/L   Potassium 4.1 3.5 - 5.1 mmol/L   Chloride 102 101 - 111 mmol/L   CO2 25 22 - 32 mmol/L   Glucose, Bld 151 (H) 65 - 99 mg/dL   BUN 21 (H) 6 - 20 mg/dL   Creatinine, Ser 0.73 0.44 - 1.00 mg/dL   Calcium 10.7 (H) 8.9 - 10.3 mg/dL   Total Protein 8.2 (H) 6.5 - 8.1 g/dL   Albumin 4.7 3.5 - 5.0 g/dL   AST 42 (H) 15 - 41 U/L   ALT 35 14 - 54 U/L   Alkaline Phosphatase 117 38 - 126 U/L   Total Bilirubin 1.1 0.3 - 1.2 mg/dL   GFR calc non Af Amer >60 >60 mL/min   GFR calc Af Amer >60 >60 mL/min    Comment: (NOTE) The eGFR has been calculated using the CKD EPI equation. This calculation has not been validated in all clinical situations. eGFR's persistently <60 mL/min signify possible Chronic Kidney Disease.    Anion gap 13 5 - 15  CBC     Status:  Abnormal   Collection Time: 12/25/16 10:12 PM  Result Value Ref Range   WBC 13.4 (H) 4.0 - 10.5 K/uL   RBC 4.44 3.87 - 5.11 MIL/uL   Hemoglobin 13.9 12.0 - 15.0 g/dL   HCT 40.9 36.0 - 46.0 %   MCV 92.1 78.0 - 100.0 fL   MCH 31.3 26.0 - 34.0 pg   MCHC 34.0 30.0 - 36.0 g/dL   RDW 12.8 11.5 - 15.5 %   Platelets 222 150 - 400 K/uL  Troponin I     Status: None   Collection Time: 12/25/16 10:12 PM  Result Value Ref Range   Troponin I <0.03 <0.03 ng/mL  Comprehensive metabolic panel     Status: Abnormal   Collection Time: 12/26/16  5:46 AM  Result Value Ref Range   Sodium 142 135 - 145 mmol/L   Potassium 3.7 3.5 - 5.1 mmol/L   Chloride 107 101 - 111 mmol/L   CO2 27 22 - 32 mmol/L   Glucose, Bld 130 (H) 65 - 99 mg/dL   BUN 20 6 - 20 mg/dL   Creatinine, Ser 0.58 0.44 - 1.00 mg/dL   Calcium 9.0 8.9 - 10.3 mg/dL   Total Protein 6.8 6.5 - 8.1 g/dL   Albumin 3.8 3.5 - 5.0 g/dL   AST 31 15 - 41 U/L   ALT 27 14 - 54 U/L   Alkaline Phosphatase 88 38 - 126 U/L   Total Bilirubin 0.7 0.3 - 1.2 mg/dL   GFR calc non Af Amer >60 >60 mL/min   GFR calc Af Amer >60 >60 mL/min    Comment: (NOTE) The eGFR has been calculated using the CKD EPI equation. This calculation has not been validated in all clinical situations. eGFR's persistently <60 mL/min signify possible Chronic Kidney Disease.    Anion gap 8 5 - 15  CBC     Status: Abnormal   Collection Time: 12/26/16  5:46 AM  Result Value Ref Range   WBC 10.7 (H) 4.0 - 10.5 K/uL   RBC 3.84 (L) 3.87 - 5.11 MIL/uL   Hemoglobin 11.9 (L) 12.0 - 15.0 g/dL   HCT 35.7 (L) 36.0 - 46.0 %   MCV 93.0 78.0 - 100.0 fL   MCH 31.0 26.0 - 34.0 pg   MCHC 33.3 30.0 - 36.0 g/dL   RDW 13.1 11.5 - 15.5 %   Platelets 204 150 - 400 K/uL  Ct Abdomen Pelvis W Contrast  Result Date: 12/26/2016 CLINICAL DATA:  Abdominal pain with nausea and vomiting starting 2 hours ago. EXAM: CT ABDOMEN AND PELVIS WITH CONTRAST TECHNIQUE: Multidetector CT imaging of the  abdomen and pelvis was performed using the standard protocol following bolus administration of intravenous contrast. CONTRAST:  154m ISOVUE-300 IOPAMIDOL (ISOVUE-300) INJECTION 61% COMPARISON:  None. FINDINGS: Lower chest: The lung bases are clear. Moderate-sized esophageal hiatal hernia. Hepatobiliary: No focal liver abnormality is seen. No gallstones, gallbladder wall thickening, or biliary dilatation. Pancreas: Unremarkable. No pancreatic ductal dilatation or surrounding inflammatory changes. Spleen: Normal in size without focal abnormality. Adrenals/Urinary Tract: Adrenal glands are unremarkable. Kidneys are normal, without renal calculi, focal lesion, or hydronephrosis. Bladder is unremarkable. Stomach/Bowel: Stomach and small bowel are diffusely fluid-filled. Mildly distended proximal small bowel. There is a anterior abdominal wall hernia in the low pelvis containing multiple loops of small bowel. Small bowel exiting the hernia are decompressed. This is consistent with small bowel obstruction due to the hernia. The colon is mostly decompressed with diffusely scattered colonic diverticula. No inflammatory changes. Appendix is not identified. Vascular/Lymphatic: Aortic atherosclerosis. No enlarged abdominal or pelvic lymph nodes. Reproductive: Status post hysterectomy. No adnexal masses. Other: No free air or free fluid in the abdomen. Musculoskeletal: Degenerative changes in the spine. Lower thoracic vertebral compression deformities appear chronic. Vertebral hemangiomas. Degenerative changes in the hips. IMPRESSION: 1. Low anterior pelvic wall hernia containing small bowel with associated small bowel obstruction at the level of the hernia. 2. Moderate-sized esophageal hiatal hernia. 3. Aortic atherosclerosis. Electronically Signed   By: WLucienne CapersM.D.   On: 12/26/2016 00:12   Dg Chest Port 1 View  Result Date: 12/26/2016 CLINICAL DATA:  NG tube placement EXAM: PORTABLE CHEST 1 VIEW COMPARISON:   11/08/2010 FINDINGS: Enteric tube tip is in the left upper quadrant consistent with location in the upper stomach. Cardiac enlargement without vascular congestion or edema. No focal consolidation. No blunting of costophrenic angles. Mediastinal contours appear intact. IMPRESSION: Enteric tube tip is in the left upper quadrant consistent with location in the upper stomach. No evidence of active pulmonary disease. Electronically Signed   By: WLucienne CapersM.D.   On: 12/26/2016 05:05    ROS:  Pertinent items are noted in HPI.  Blood pressure (!) 141/56, pulse 73, temperature 98.8 F (37.1 C), resp. rate 16, height 5' 2"  (1.575 m), weight 161 lb 6 oz (73.2 kg), SpO2 100 %. Physical Exam: pleasant well-developed well-nourished white female in no acute distress Head is normocephalic, atraumatic Lungs clear auscultation with equal breath sounds bilaterally. Heart examination reveals a regular rate and rhythm without S3, S4, murmurs. Abdomen is soft with minimal bowel sounds. A reducible suprapubic incisional hernia is present. No hepatosplenomegaly or masses are noted.  CT scan images personally reviewed  Assessment/Plan: Impression: Small bowel obstruction secondary to incisional hernia Plan:Would continue NG tube decompression for now. No need for acute surgical intervention, though I think she needs an incisional herniorrhaphy with mesh during this admission. This was explained to the patient and family. I would like to decompress her bowel prior to surgery.I have temporarily scheduled her surgery for 12/28/2016.  MAviva Signs10/17/2018, 9:49 AM

## 2016-12-26 NOTE — Progress Notes (Signed)
Patient admitted to the hospital earlier this morning by Dr. Maudie Mercury  Patient seen and examined. She has some abdominal discomfort, no nausea or vomiting. NG tube was placed earlier today with no significant help. Lungs are clear bilaterally.  She was admitted to the hospital with small bowel obstruction secondary to incisional hernia. Gen. Surgery is following.NG tube was placed for decompression but she has not had any significant output. Discussed with Dr. Arnoldo Morale and he is recommended to remove the NG tube and start on sips of clear liquids. We'll likely plan on operative management on 10/19.  MEMON,JEHANZEB

## 2016-12-26 NOTE — ED Notes (Signed)
Pt placed on 2l O2 due to sats dropping while pt sleeping.

## 2016-12-26 NOTE — Progress Notes (Signed)
Patient NG tube removed per orders. Patient tolerated well.

## 2016-12-26 NOTE — H&P (Signed)
TRH H&P   Patient Demographics:    Sara Fischer, is a 76 y.o. female  MRN: [id number]   DOB - December 09, 1940  Admit Date - 12/25/2016  Outpatient Primary MD for the patient is Redmond School, MD  Referring MD/NP/PA: Lily Kocher  Outpatient Specialists:   Patient coming from: home  Chief Complaint  Patient presents with  . Abdominal Pain      HPI:    Sara Fischer  is a 76 y.o. female, w hypertension , diverticulosis, h/o appy, hysterectomy who apparently c/o n/v for 2 hours prior to arrival in ED along with abdominal pain.  Pt denies fever, chills, diarrhea, constipation, brbpr, black stool.    In ED,  Bun 21, Creatinine 0.73,   Wbc 13.4, Hgb 13.9, Plt 222  CT scan abd/ pelvis=>  IMPRESSION: 1. Low anterior pelvic wall hernia containing small bowel with associated small bowel obstruction at the level of the hernia. 2. Moderate-sized esophageal hiatal hernia. 3. Aortic atherosclerosis.  ED consulted surgery, Dr. Arnoldo Morale Pt will be admitted for SBO   Review of systems:    In addition to the HPI above, No Fever-chills, No Headache, No changes with Vision or hearing, No problems swallowing food or Liquids, No Chest pain, Cough or Shortness of Breath,  No Blood in stool or Urine, No dysuria, No new skin rashes or bruises, No new joints pains-aches,  No new weakness, tingling, numbness in any extremity, No recent weight gain or loss, No polyuria, polydypsia or polyphagia, No significant Mental Stressors.  A full 10 point Review of Systems was done, except as stated above, all other Review of Systems were negative.   With Past History of the following :    Past Medical History:  Diagnosis Date  . High cholesterol   . Hypertension       Past Surgical History:  Procedure Laterality Date  . APPENDECTOMY    . COLONOSCOPY  2010   Dr. Gala Romney: pancolonic  diverticulosis, sigmoid colon tubular adenoma removed.   . COLONOSCOPY N/A 02/29/2016   Procedure: COLONOSCOPY;  Surgeon: Daneil Dolin, MD;  Location: [facility];  Service: Endoscopy;  Laterality: N/A;  230  . KNEE ARTHROSCOPY     bilateral  . LAPAROSCOPIC BILATERAL SALPINGO OOPHERECTOMY  2001  . PARTIAL HYSTERECTOMY  1979      Social History:     Social History  Substance Use Topics  . Smoking status: Former Smoker    Packs/day: 0.50    Years: 5.00    Types: Cigarettes    Start date: 08/28/1963    Quit date: 11/02/1968  . Smokeless tobacco: Never Used  . Alcohol use 0.0 oz/week     Comment: wine occas.     Lives - at home  Mobility - walks by self   Family History :     Family History  Problem Relation Age of Onset  .  Diabetes Mother   . Heart disease Mother   . Heart disease Father 40  . Colon cancer Neg Hx       Home Medications:   Prior to Admission medications   Medication Sig Start Date End Date Taking? Authorizing Provider  aspirin EC 81 MG tablet Take 81 mg by mouth daily.    [provider]  Cholecalciferol (D3-1000 PO) Take 1,000 capsules by mouth daily.     [provider]  Coenzyme Q10 (CO Q-10) 100 MG CAPS Take 100 mg by mouth daily.     [provider]  Flaxseed, Linseed, (FLAX SEEDS PO) Take 1 capsule by mouth daily.     [provider]  fluticasone (FLONASE) 50 MCG/ACT nasal spray Place 1 spray into both nostrils daily as needed for allergies or rhinitis.    [provider]  furosemide (LASIX) 40 MG tablet Take 40 mg by mouth daily as needed for fluid or edema.     [provider]  KRILL OIL PO Take 1 capsule by mouth daily.    [provider]  metoprolol succinate (TOPROL-XL) 100 MG 24 hr tablet Take 100 mg by mouth daily. Take with or immediately following a meal.    [provider]  Na Sulfate-K Sulfate-Mg Sulf (SUPREP BOWEL PREP KIT) 17.5-3.13-1.6 GM/180ML SOLN Take 1  kit by mouth as directed. 02/10/16   Rourk, Cristopher Estimable, MD  naproxen sodium (ANAPROX) 220 MG tablet Take 220 mg by mouth daily as needed (pain).    [provider]  simvastatin (ZOCOR) 20 MG tablet Take 20 mg by mouth at bedtime.     [provider]     Allergies:     Allergies  Allergen Reactions  . Feldene [Piroxicam]     Headaches   . Lodine [Etodolac]     headaches     Physical Exam:   Vitals  Blood pressure (!) 159/76, pulse 78, temperature 98.7 F (37.1 C), temperature source Oral, resp. rate 16, weight 74.5 kg (164 lb 5 oz), SpO2 94 %.   1. General  lying in bed in NAD  2. Normal affect and insight, Not Suicidal or Homicidal, Awake Alert, Oriented X 3.  3. No F.N deficits, ALL C.Nerves Intact, Strength 5/5 all 4 extremities, Sensation intact all 4 extremities, Plantars down going.  4. Ears and Eyes appear Normal, Conjunctivae clear, PERRLA. Moist Oral Mucosa.  5. Supple Neck, No JVD, No cervical lymphadenopathy appriciated, No Carotid Bruits.  6. Symmetrical Chest wall movement, Good air movement bilaterally, CTAB.  7. RRR, No Gallops, Rubs or Murmurs, No Parasternal Heave.  8. Positive Bowel Sounds, Abdomen Soft, slight tenderness, + hernia,  No organomegaly appriciated,No rebound -guarding or rigidity.  9.  No Cyanosis, Normal Skin Turgor, No Skin Rash or Bruise.  10. Good muscle tone,  joints appear normal , no effusions, Normal ROM.  11. No Palpable Lymph Nodes in Neck or Axillae    Data Review:    CBC  Recent Labs Lab 12/25/16 2212  WBC 13.4*  HGB 13.9  HCT 40.9  PLT 222  MCV 92.1  MCH 31.3  MCHC 34.0  RDW 12.8   ------------------------------------------------------------------------------------------------------------------  Chemistries   Recent Labs Lab 12/25/16 2212  NA 140  K 4.1  CL 102  CO2 25  GLUCOSE 151*  BUN 21*  CREATININE 0.73  CALCIUM 10.7*  AST 42*  ALT 35  ALKPHOS 117  BILITOT 1.1    ------------------------------------------------------------------------------------------------------------------ CrCl cannot be calculated (Unknown  ideal weight.). ------------------------------------------------------------------------------------------------------------------ No results for input(s): TSH, T4TOTAL, T3FREE, THYROIDAB in the last 72 hours.  Invalid input(s): FREET3  Coagulation profile No results for input(s): INR, PROTIME in the last 168 hours. ------------------------------------------------------------------------------------------------------------------- No results for input(s): DDIMER in the last 72 hours. -------------------------------------------------------------------------------------------------------------------  Cardiac Enzymes  Recent Labs Lab 12/25/16 2212  TROPONINI <0.03   ------------------------------------------------------------------------------------------------------------------ No results found for: BNP   ---------------------------------------------------------------------------------------------------------------  Urinalysis    Component Value Date/Time   COLORURINE AMBER (A) 12/25/2016 2157   APPEARANCEUR CLOUDY (A) 12/25/2016 2157   LABSPEC 1.026 12/25/2016 2157   PHURINE 5.0 12/25/2016 2157   [place] 12/25/2016 2157   [place] NEGATIVE 12/25/2016 2157   [place] NEGATIVE 12/25/2016 2157   KETONESUR 20 (A) 12/25/2016 2157   PROTEINUR 30 (A) 12/25/2016 2157   NITRITE NEGATIVE 12/25/2016 2157   LEUKOCYTESUR LARGE (A) 12/25/2016 2157    ----------------------------------------------------------------------------------------------------------------   Imaging Results:    Ct Abdomen Pelvis W Contrast  Result Date: 12/26/2016 CLINICAL DATA:  Abdominal pain with nausea and vomiting starting 2 hours ago. EXAM: CT ABDOMEN AND PELVIS WITH CONTRAST TECHNIQUE: Multidetector CT imaging of the abdomen and pelvis was  performed using the standard protocol following bolus administration of intravenous contrast. CONTRAST:  185m ISOVUE-300 IOPAMIDOL (ISOVUE-300) INJECTION 61% COMPARISON:  None. FINDINGS: Lower chest: The lung bases are clear. Moderate-sized esophageal hiatal hernia. Hepatobiliary: No focal liver abnormality is seen. No gallstones, gallbladder wall thickening, or biliary dilatation. Pancreas: Unremarkable. No pancreatic ductal dilatation or surrounding inflammatory changes. Spleen: Normal in size without focal abnormality. Adrenals/Urinary Tract: Adrenal glands are unremarkable. Kidneys are normal, without renal calculi, focal lesion, or hydronephrosis. Bladder is unremarkable. Stomach/Bowel: Stomach and small bowel are diffusely fluid-filled. Mildly distended proximal small bowel. There is a anterior abdominal wall hernia in the low pelvis containing multiple loops of small bowel. Small bowel exiting the hernia are decompressed. This is consistent with small bowel obstruction due to the hernia. The colon is mostly decompressed with diffusely scattered colonic diverticula. No inflammatory changes. Appendix is not identified. Vascular/Lymphatic: Aortic atherosclerosis. No enlarged abdominal or pelvic lymph nodes. Reproductive: Status post hysterectomy. No adnexal masses. Other: No free air or free fluid in the abdomen. Musculoskeletal: Degenerative changes in the spine. Lower thoracic vertebral compression deformities appear chronic. Vertebral hemangiomas. Degenerative changes in the hips. IMPRESSION: 1. Low anterior pelvic wall hernia containing small bowel with associated small bowel obstruction at the level of the hernia. 2. Moderate-sized esophageal hiatal hernia. 3. Aortic atherosclerosis. Electronically Signed   By: WLucienne CapersM.D.   On: 12/26/2016 00:12     Assessment & Plan:    Active Problems:   SBO (small bowel obstruction) (HCC)   Hypertension  SBO NPO Ns iv NGT to low intermittent  suction Surgery consulted by ED, appreciate input.  Hold aspirin  Hypertension Cont metoprolol  Hyperlipidemia Cont simvastatin,    DVT Prophylaxis Lovenox - SCDs   AM Labs Ordered, also please review Full Orders  Family Communication: Admission, patients condition and plan of care including tests being ordered have been discussed with the patient who indicate understanding and agree with the plan and Code Status.  Code Status FULL CODE  Likely DC to  home  Condition GUARDED    Consults called: surgery by ED  Admission status: inpatient  Time spent in minutes : 45   JJani GravelM.D on 12/26/2016 at 2:01 AM  Between 7am to 7pm - Pager - [phone] After 7pm go to [email or web address] - password [email or web address]  [facility] - Office  [phone]

## 2016-12-26 NOTE — Plan of Care (Signed)
Problem: Pain Managment: Goal: General experience of comfort will improve Outcome: Progressing Pt has no c/o pain needing pain medication so far this shift. Pt does c/o abdominal tenderness upon palpation. Pt educated on pain mgmt as well as medications available. Pt verbalized understanding. Will continue to monitor pt

## 2016-12-27 ENCOUNTER — Other Ambulatory Visit: Payer: Self-pay

## 2016-12-27 DIAGNOSIS — K430 Incisional hernia with obstruction, without gangrene: Principal | ICD-10-CM

## 2016-12-27 DIAGNOSIS — E785 Hyperlipidemia, unspecified: Secondary | ICD-10-CM

## 2016-12-27 LAB — URINE CULTURE: SPECIAL REQUESTS: NORMAL

## 2016-12-27 LAB — BASIC METABOLIC PANEL
Anion gap: 5 (ref 5–15)
BUN: 14 mg/dL (ref 6–20)
CALCIUM: 8.6 mg/dL — AB (ref 8.9–10.3)
CHLORIDE: 110 mmol/L (ref 101–111)
CO2: 27 mmol/L (ref 22–32)
CREATININE: 0.52 mg/dL (ref 0.44–1.00)
GFR calc non Af Amer: >60 mL/min (ref >60)
Glucose, Bld: 89 mg/dL (ref 65–99)
Potassium: 3.6 mmol/L (ref 3.5–5.1)
SODIUM: 142 mmol/L (ref 135–145)

## 2016-12-27 LAB — CBC
HCT: 32.2 % — ABNORMAL LOW (ref 36.0–46.0)
HEMOGLOBIN: 10.5 g/dL — AB (ref 12.0–15.0)
MCH: 30.9 pg (ref 26.0–34.0)
MCHC: 32.6 g/dL (ref 30.0–36.0)
MCV: 94.7 fL (ref 78.0–100.0)
Platelets: 147 10*3/uL — ABNORMAL LOW (ref 150–400)
RBC: 3.4 MIL/uL — ABNORMAL LOW (ref 3.87–5.11)
RDW: 13.1 % (ref 11.5–15.5)
WBC: 5.3 10*3/uL (ref 4.0–10.5)

## 2016-12-27 LAB — SURGICAL PCR SCREEN
MRSA, PCR: NEGATIVE
STAPHYLOCOCCUS AUREUS: NEGATIVE

## 2016-12-27 MED ORDER — CHLORHEXIDINE GLUCONATE CLOTH 2 % EX PADS
6.0000 | MEDICATED_PAD | Freq: Once | CUTANEOUS | Status: AC
  Administered 2016-12-28: 6 via TOPICAL

## 2016-12-27 MED ORDER — CHLORHEXIDINE GLUCONATE CLOTH 2 % EX PADS
6.0000 | MEDICATED_PAD | Freq: Once | CUTANEOUS | Status: AC
  Administered 2016-12-27: 6 via TOPICAL

## 2016-12-27 NOTE — Progress Notes (Signed)
**Note De-identified Katasha Riga Obfuscation** EKG complete; placed in patient chart 

## 2016-12-27 NOTE — Progress Notes (Signed)
PROGRESS NOTE    Sara Fischer  BOF:[id number] DOB: Oct 01, 1940 DOA: 12/25/2016 PCP: Redmond School, MD    Brief Narrative:  76 -year-old female admitted to the hospital with small bowel obstruction secondary to incisional hernia. Seen by general surgery and plans are for operative management on 10/19.   Assessment & Plan:   Active Problems:   SBO (small bowel obstruction) (HCC)   Hypertension   Abdominal wall hernia   Incisional hernia with obstruction but no gangrene   1. Small bowel obstruction secondary to incisional hernia. Patient initially had NG tube placed for decompression, but did not have any significant output. She was placed on bowel rest and her symptoms appear to be improving. She is passing flatus, had a bowel movement yesterday and is not vomiting. She is tolerating clear liquids. She has been seen by General surgery and plans are for operative management of hernia on 10/19 2. HTN. Blood pressure stable, continue on metoprolol 3. HLD. On statin   DVT prophylaxis: lovenox Code Status: full code Family Communication: no family present Disposition Plan: discharge home once improved    Consultants:   General Surgery  Procedures:     Antimicrobials:      Subjective: Tolerating liquids. Abdomen is sore, but pain isn't any worse. No vomiting. Passing flatus. Had a bowel movement yesterday  Objective: Vitals:   12/26/16 1735 12/26/16 1948 12/26/16 2042 12/27/16 0500  BP: (!) 148/52  (!) 151/52 (!) 150/63  Pulse: 64  75 68  Resp: 17  16 18   Temp: 99.2 F (37.3 C)  99 F (37.2 C) 98.1 F (36.7 C)  TempSrc: Oral  Oral Oral  SpO2: 99% 92% 97% 97%  Weight:    73.5 kg (162 lb)  Height:        Intake/Output Summary (Last 24 hours) at 12/27/16 1416 Last data filed at 12/27/16 0323  Gross per 24 hour  Intake             4910 ml  Output              650 ml  Net             4260 ml   Filed Weights   12/25/16 2155 12/26/16 0410 12/27/16 0500    Weight: 74.5 kg (164 lb 5 oz) 73.2 kg (161 lb 6 oz) 73.5 kg (162 lb)    Examination:  General exam: Appears calm and comfortable  Respiratory system: Clear to auscultation. Respiratory effort normal. Cardiovascular system: S1 & S2 heard, RRR. No JVD, murmurs, rubs, gallops or clicks. No pedal edema. Gastrointestinal system: Abdomen is distended, soft and tender in periumbilical region. No organomegaly or masses felt. Normal bowel sounds heard. Reducible lower midline hernia present Central nervous system: Alert and oriented. No focal neurological deficits. Extremities: Symmetric 5 x 5 power. Skin: No rashes, lesions or ulcers Psychiatry: Judgement and insight appear normal. Mood & affect appropriate.     Data Reviewed: I have personally reviewed following labs and imaging studies  CBC:  Recent Labs Lab 12/25/16 2212 12/26/16 0546 12/27/16 0416  WBC 13.4* 10.7* 5.3  HGB 13.9 11.9* 10.5*  HCT 40.9 35.7* 32.2*  MCV 92.1 93.0 94.7  PLT 222 204 778*   Basic Metabolic Panel:  Recent Labs Lab 12/25/16 2212 12/26/16 0546 12/27/16 0416  NA 140 142 142  K 4.1 3.7 3.6  CL 102 107 110  CO2 25 27 27   GLUCOSE 151* 130* 89  BUN 21* 20 14  CREATININE 0.73 0.58 0.52  CALCIUM 10.7* 9.0 8.6*   GFR: Estimated Creatinine Clearance: 54.4 mL/min (by C-G formula based on SCr of 0.52 mg/dL). Liver Function Tests:  Recent Labs Lab 12/25/16 2212 12/26/16 0546  AST 42* 31  ALT 35 27  ALKPHOS 117 88  BILITOT 1.1 0.7  PROT 8.2* 6.8  ALBUMIN 4.7 3.8    Recent Labs Lab 12/25/16 2212  LIPASE 20   No results for input(s): AMMONIA in the last 168 hours. Coagulation Profile: No results for input(s): INR, PROTIME in the last 168 hours. Cardiac Enzymes:  Recent Labs Lab 12/25/16 2212  TROPONINI <0.03   BNP (last 3 results) No results for input(s): PROBNP in the last 8760 hours. HbA1C: No results for input(s): HGBA1C in the last 72 hours. CBG: No results for input(s):  GLUCAP in the last 168 hours. Lipid Profile: No results for input(s): CHOL, HDL, LDLCALC, TRIG, CHOLHDL, LDLDIRECT in the last 72 hours. Thyroid Function Tests: No results for input(s): TSH, T4TOTAL, FREET4, T3FREE, THYROIDAB in the last 72 hours. Anemia Panel: No results for input(s): VITAMINB12, FOLATE, FERRITIN, TIBC, IRON, RETICCTPCT in the last 72 hours. Sepsis Labs: No results for input(s): PROCALCITON, LATICACIDVEN in the last 168 hours.  Recent Results (from the past 240 hour(s))  Urine culture     Status: Abnormal   Collection Time: 12/25/16  9:57 PM  Result Value Ref Range Status   Specimen Description URINE, CLEAN CATCH  Final   Special Requests Normal  Final   Culture MULTIPLE SPECIES PRESENT, SUGGEST RECOLLECTION (A)  Final   Report Status 12/27/2016 FINAL  Final         Radiology Studies: Ct Abdomen Pelvis W Contrast  Result Date: 12/26/2016 CLINICAL DATA:  Abdominal pain with nausea and vomiting starting 2 hours ago. EXAM: CT ABDOMEN AND PELVIS WITH CONTRAST TECHNIQUE: Multidetector CT imaging of the abdomen and pelvis was performed using the standard protocol following bolus administration of intravenous contrast. CONTRAST:  115mL ISOVUE-300 IOPAMIDOL (ISOVUE-300) INJECTION 61% COMPARISON:  None. FINDINGS: Lower chest: The lung bases are clear. Moderate-sized esophageal hiatal hernia. Hepatobiliary: No focal liver abnormality is seen. No gallstones, gallbladder wall thickening, or biliary dilatation. Pancreas: Unremarkable. No pancreatic ductal dilatation or surrounding inflammatory changes. Spleen: Normal in size without focal abnormality. Adrenals/Urinary Tract: Adrenal glands are unremarkable. Kidneys are normal, without renal calculi, focal lesion, or hydronephrosis. Bladder is unremarkable. Stomach/Bowel: Stomach and small bowel are diffusely fluid-filled. Mildly distended proximal small bowel. There is a anterior abdominal wall hernia in the low pelvis containing  multiple loops of small bowel. Small bowel exiting the hernia are decompressed. This is consistent with small bowel obstruction due to the hernia. The colon is mostly decompressed with diffusely scattered colonic diverticula. No inflammatory changes. Appendix is not identified. Vascular/Lymphatic: Aortic atherosclerosis. No enlarged abdominal or pelvic lymph nodes. Reproductive: Status post hysterectomy. No adnexal masses. Other: No free air or free fluid in the abdomen. Musculoskeletal: Degenerative changes in the spine. Lower thoracic vertebral compression deformities appear chronic. Vertebral hemangiomas. Degenerative changes in the hips. IMPRESSION: 1. Low anterior pelvic wall hernia containing small bowel with associated small bowel obstruction at the level of the hernia. 2. Moderate-sized esophageal hiatal hernia. 3. Aortic atherosclerosis. Electronically Signed   By: Lucienne Capers M.D.   On: 12/26/2016 00:12   Dg Chest Port 1 View  Result Date: 12/26/2016 CLINICAL DATA:  NG tube placement EXAM: PORTABLE CHEST 1 VIEW COMPARISON:  11/08/2010 FINDINGS: Enteric tube tip is in the left  upper quadrant consistent with location in the upper stomach. Cardiac enlargement without vascular congestion or edema. No focal consolidation. No blunting of costophrenic angles. Mediastinal contours appear intact. IMPRESSION: Enteric tube tip is in the left upper quadrant consistent with location in the upper stomach. No evidence of active pulmonary disease. Electronically Signed   By: Lucienne Capers M.D.   On: 12/26/2016 05:05        Scheduled Meds: . Chlorhexidine Gluconate Cloth  6 each Topical Once   And  . Chlorhexidine Gluconate Cloth  6 each Topical Once  . enoxaparin (LOVENOX) injection  40 mg Subcutaneous Q24H  . metoprolol succinate  100 mg Oral Daily  . simvastatin  20 mg Oral QHS   Continuous Infusions: . sodium chloride 1,000 mL (12/26/16 0038)     LOS: 1 day    Time spent:  66mins    Lakyia Behe, MD [facility] Pager [phone]  If 7PM-7AM, please contact night-coverage [email or web address] Password [email or web address] 12/27/2016, 2:16 PM

## 2016-12-27 NOTE — Plan of Care (Signed)
Problem: Safety: Goal: Ability to remain free from injury will improve Outcome: Progressing Discuss need to call for pain medication after surgery; monitoring to be ongoing post-operatively

## 2016-12-27 NOTE — Progress Notes (Signed)
Subjective: Patient had a bowel movement yesterday. No nausea or vomiting noted.  Objective: Vital signs in last 24 hours: Temp:  [98.1 F (36.7 C)-99.2 F (37.3 C)] 98.1 F (36.7 C) (10/18 0500) Pulse Rate:  [64-75] 68 (10/18 0500) Resp:  [16-18] 18 (10/18 0500) BP: (148-151)/(52-63) 150/63 (10/18 0500) SpO2:  [92 %-99 %] 97 % (10/18 0500) Weight:  [162 lb (73.5 kg)] 162 lb (73.5 kg) (10/18 0500) Last BM Date: 12/26/16  Intake/Output from previous day: 10/17 0701 - 10/18 0700 In: 4910 [P.O.:100; I.V.:4810] Out: 650 [Urine:650] Intake/Output this shift: No intake/output data recorded.  General appearance: alert, cooperative and no distress Resp: clear to auscultation bilaterally Cardio: regular rate and rhythm, S1, S2 normal, no murmur, click, rub or gallop GI: Soft, nontender, nondistended. Reducible lower midline incisional hernia present. Bowel sounds present.  Lab Results:   Recent Labs  12/26/16 0546 12/27/16 0416  WBC 10.7* 5.3  HGB 11.9* 10.5*  HCT 35.7* 32.2*  PLT 204 147*   BMET  Recent Labs  12/26/16 0546 12/27/16 0416  NA 142 142  K 3.7 3.6  CL 107 110  CO2 27 27  GLUCOSE 130* 89  BUN 20 14  CREATININE 0.58 0.52  CALCIUM 9.0 8.6*   PT/INR No results for input(s): LABPROT, INR in the last 72 hours.  Studies/Results: Ct Abdomen Pelvis W Contrast  Result Date: 12/26/2016 CLINICAL DATA:  Abdominal pain with nausea and vomiting starting 2 hours ago. EXAM: CT ABDOMEN AND PELVIS WITH CONTRAST TECHNIQUE: Multidetector CT imaging of the abdomen and pelvis was performed using the standard protocol following bolus administration of intravenous contrast. CONTRAST:  168mL ISOVUE-300 IOPAMIDOL (ISOVUE-300) INJECTION 61% COMPARISON:  None. FINDINGS: Lower chest: The lung bases are clear. Moderate-sized esophageal hiatal hernia. Hepatobiliary: No focal liver abnormality is seen. No gallstones, gallbladder wall thickening, or biliary dilatation. Pancreas:  Unremarkable. No pancreatic ductal dilatation or surrounding inflammatory changes. Spleen: Normal in size without focal abnormality. Adrenals/Urinary Tract: Adrenal glands are unremarkable. Kidneys are normal, without renal calculi, focal lesion, or hydronephrosis. Bladder is unremarkable. Stomach/Bowel: Stomach and small bowel are diffusely fluid-filled. Mildly distended proximal small bowel. There is a anterior abdominal wall hernia in the low pelvis containing multiple loops of small bowel. Small bowel exiting the hernia are decompressed. This is consistent with small bowel obstruction due to the hernia. The colon is mostly decompressed with diffusely scattered colonic diverticula. No inflammatory changes. Appendix is not identified. Vascular/Lymphatic: Aortic atherosclerosis. No enlarged abdominal or pelvic lymph nodes. Reproductive: Status post hysterectomy. No adnexal masses. Other: No free air or free fluid in the abdomen. Musculoskeletal: Degenerative changes in the spine. Lower thoracic vertebral compression deformities appear chronic. Vertebral hemangiomas. Degenerative changes in the hips. IMPRESSION: 1. Low anterior pelvic wall hernia containing small bowel with associated small bowel obstruction at the level of the hernia. 2. Moderate-sized esophageal hiatal hernia. 3. Aortic atherosclerosis. Electronically Signed   By: Lucienne Capers M.D.   On: 12/26/2016 00:12   Dg Chest Port 1 View  Result Date: 12/26/2016 CLINICAL DATA:  NG tube placement EXAM: PORTABLE CHEST 1 VIEW COMPARISON:  11/08/2010 FINDINGS: Enteric tube tip is in the left upper quadrant consistent with location in the upper stomach. Cardiac enlargement without vascular congestion or edema. No focal consolidation. No blunting of costophrenic angles. Mediastinal contours appear intact. IMPRESSION: Enteric tube tip is in the left upper quadrant consistent with location in the upper stomach. No evidence of active pulmonary disease.  Electronically Signed   By: Gwyndolyn Saxon  Gerilyn Nestle M.D.   On: 12/26/2016 05:05    Anti-infectives: Anti-infectives    None      Assessment/Plan: Impression: Small bowel obstruction secondary to incisional hernia. Bowel obstruction symptoms have resolved. Plan: Will proceed with incisional herniorrhaphy with mesh tomorrow. The risks and benefits of the procedure including bleeding, infection, mesh use, and the possibility of recurrence of the hernia were fully explained to the patient, who gave informed consent.  LOS: 1 day    Aviva Signs 12/27/2016

## 2016-12-27 NOTE — Plan of Care (Signed)
Problem: Physical Regulation: Goal: Will remain free from infection Outcome: Progressing Discussed hand hygiene practices to be paramount in prevention of infection-during hospitalization.

## 2016-12-27 NOTE — Plan of Care (Signed)
Problem: Education: Goal: Knowledge of [facility] [facility] Education information/materials will improve Outcome: Progressing Discussed general pre-op educational information with patient, prevention of post-op pneumonia utilizing incentive spirometer; turn, cough, deep breathe techniques and adequate pain management-verbalized understanding of information given.

## 2016-12-28 ENCOUNTER — Inpatient Hospital Stay (HOSPITAL_COMMUNITY): Payer: Medicare HMO | Admitting: Anesthesiology

## 2016-12-28 ENCOUNTER — Encounter (HOSPITAL_COMMUNITY): Payer: Self-pay | Admitting: *Deleted

## 2016-12-28 ENCOUNTER — Encounter (HOSPITAL_COMMUNITY)
Admission: EM | Disposition: A | Discharge status: Home / Self Care | Payer: Self-pay | Source: Home / Self Care | Attending: Internal Medicine

## 2016-12-28 HISTORY — PX: INCISIONAL HERNIA REPAIR: SHX193

## 2016-12-28 SURGERY — REPAIR, HERNIA, INCISIONAL
Anesthesia: General

## 2016-12-28 MED ORDER — GLYCOPYRROLATE 0.2 MG/ML IJ SOLN
INTRAMUSCULAR | Status: DC | PRN
  Administered 2016-12-28: .6 mg via INTRAVENOUS

## 2016-12-28 MED ORDER — DEXAMETHASONE SODIUM PHOSPHATE 4 MG/ML IJ SOLN
4.0000 mg | Freq: Once | INTRAMUSCULAR | Status: AC
  Administered 2016-12-28: 4 mg via INTRAVENOUS

## 2016-12-28 MED ORDER — ACETAMINOPHEN 650 MG RE SUPP: 650.0000 mg | Freq: Four times a day (QID) | RECTAL | Status: DC | PRN

## 2016-12-28 MED ORDER — POVIDONE-IODINE 10 % EX OINT
TOPICAL_OINTMENT | CUTANEOUS | Status: AC
  Filled 2016-12-28: qty 1

## 2016-12-28 MED ORDER — 0.9 % SODIUM CHLORIDE (POUR BTL) OPTIME
TOPICAL | Status: DC | PRN
  Administered 2016-12-28: 1000 mL

## 2016-12-28 MED ORDER — BUPIVACAINE LIPOSOME 1.3 % IJ SUSP
INTRAMUSCULAR | Status: DC | PRN
  Administered 2016-12-28: 20 mL

## 2016-12-28 MED ORDER — ROCURONIUM 10MG/ML (10ML) SYRINGE FOR MEDFUSION PUMP - OPTIME
INTRAVENOUS | Status: DC | PRN
  Administered 2016-12-28: 30 mg via INTRAVENOUS

## 2016-12-28 MED ORDER — CHLORHEXIDINE GLUCONATE CLOTH 2 % EX PADS: 6.0000 | MEDICATED_PAD | Freq: Once | CUTANEOUS | Status: DC

## 2016-12-28 MED ORDER — EPHEDRINE SULFATE 50 MG/ML IJ SOLN
INTRAMUSCULAR | Status: AC
  Filled 2016-12-28: qty 1

## 2016-12-28 MED ORDER — ONDANSETRON 4 MG PO TBDP: 4.0000 mg | ORAL_TABLET | Freq: Four times a day (QID) | ORAL | Status: DC | PRN

## 2016-12-28 MED ORDER — CEFAZOLIN SODIUM-DEXTROSE 2-4 GM/100ML-% IV SOLN
2.0000 g | INTRAVENOUS | Status: AC
  Administered 2016-12-28: 2 g via INTRAVENOUS
  Filled 2016-12-28: qty 100

## 2016-12-28 MED ORDER — ONDANSETRON HCL 4 MG/2ML IJ SOLN
4.0000 mg | Freq: Once | INTRAMUSCULAR | Status: AC
  Administered 2016-12-28: 4 mg via INTRAVENOUS

## 2016-12-28 MED ORDER — PROPOFOL 10 MG/ML IV BOLUS
INTRAVENOUS | Status: DC | PRN
  Administered 2016-12-28: 90 mg via INTRAVENOUS

## 2016-12-28 MED ORDER — FENTANYL CITRATE (PF) 100 MCG/2ML IJ SOLN
25.0000 ug | INTRAMUSCULAR | Status: DC | PRN
  Administered 2016-12-28: 50 ug via INTRAVENOUS

## 2016-12-28 MED ORDER — MIDAZOLAM HCL 2 MG/2ML IJ SOLN
INTRAMUSCULAR | Status: AC
  Filled 2016-12-28: qty 2

## 2016-12-28 MED ORDER — SIMETHICONE 80 MG PO CHEW: 40.0000 mg | CHEWABLE_TABLET | Freq: Four times a day (QID) | ORAL | Status: DC | PRN

## 2016-12-28 MED ORDER — NEOSTIGMINE METHYLSULFATE 10 MG/10ML IV SOLN
INTRAVENOUS | Status: DC | PRN
  Administered 2016-12-28: 3 mg via INTRAVENOUS

## 2016-12-28 MED ORDER — ACETAMINOPHEN 325 MG PO TABS: 650.0000 mg | ORAL_TABLET | Freq: Four times a day (QID) | ORAL | Status: DC | PRN

## 2016-12-28 MED ORDER — DEXAMETHASONE SODIUM PHOSPHATE 4 MG/ML IJ SOLN
INTRAMUSCULAR | Status: AC
  Filled 2016-12-28: qty 1

## 2016-12-28 MED ORDER — LIDOCAINE HCL (PF) 1 % IJ SOLN
INTRAMUSCULAR | Status: AC
  Filled 2016-12-28: qty 5

## 2016-12-28 MED ORDER — LIDOCAINE HCL (CARDIAC) 10 MG/ML IV SOLN
INTRAVENOUS | Status: DC | PRN
  Administered 2016-12-28: 40 mg via INTRAVENOUS

## 2016-12-28 MED ORDER — HYDROCODONE-ACETAMINOPHEN 5-325 MG PO TABS: 1.0000 | ORAL_TABLET | Freq: Four times a day (QID) | ORAL | Status: DC | PRN

## 2016-12-28 MED ORDER — LACTATED RINGERS IV SOLN
INTRAVENOUS | Status: DC
Start: 2016-12-28 — End: 2016-12-29
  Administered 2016-12-28 (×2): via INTRAVENOUS

## 2016-12-28 MED ORDER — SODIUM CHLORIDE 0.9 % IJ SOLN
INTRAMUSCULAR | Status: AC
  Filled 2016-12-28: qty 10

## 2016-12-28 MED ORDER — ROCURONIUM BROMIDE 50 MG/5ML IV SOLN
INTRAVENOUS | Status: AC
  Filled 2016-12-28: qty 1

## 2016-12-28 MED ORDER — PROPOFOL 10 MG/ML IV BOLUS
INTRAVENOUS | Status: AC
  Filled 2016-12-28: qty 20

## 2016-12-28 MED ORDER — DIPHENHYDRAMINE HCL 12.5 MG/5ML PO ELIX: 12.5000 mg | ORAL_SOLUTION | Freq: Four times a day (QID) | ORAL | Status: DC | PRN

## 2016-12-28 MED ORDER — HYDROMORPHONE HCL 1 MG/ML IJ SOLN: 0.5000 mg | INTRAMUSCULAR | Status: DC | PRN

## 2016-12-28 MED ORDER — DIPHENHYDRAMINE HCL 50 MG/ML IJ SOLN: 12.5000 mg | Freq: Four times a day (QID) | INTRAMUSCULAR | Status: DC | PRN

## 2016-12-28 MED ORDER — MIDAZOLAM HCL 2 MG/2ML IJ SOLN
1.0000 mg | INTRAMUSCULAR | Status: DC
Start: 2016-12-28 — End: 2016-12-28
  Administered 2016-12-28: 2 mg via INTRAVENOUS

## 2016-12-28 MED ORDER — POVIDONE-IODINE 10 % OINT PACKET
TOPICAL_OINTMENT | CUTANEOUS | Status: DC | PRN
  Administered 2016-12-28: 1 via TOPICAL

## 2016-12-28 MED ORDER — LACTATED RINGERS IV SOLN
INTRAVENOUS | Status: DC
  Administered 2016-12-28: 12:00:00 via INTRAVENOUS

## 2016-12-28 MED ORDER — FENTANYL CITRATE (PF) 100 MCG/2ML IJ SOLN
INTRAMUSCULAR | Status: DC | PRN
Start: 2016-12-28 — End: 2016-12-28
  Administered 2016-12-28 (×2): 50 ug via INTRAVENOUS

## 2016-12-28 MED ORDER — ONDANSETRON HCL 4 MG/2ML IJ SOLN: 4.0000 mg | Freq: Four times a day (QID) | INTRAMUSCULAR | Status: DC | PRN

## 2016-12-28 MED ORDER — FENTANYL CITRATE (PF) 100 MCG/2ML IJ SOLN
INTRAMUSCULAR | Status: AC
  Filled 2016-12-28: qty 2

## 2016-12-28 MED ORDER — FENTANYL CITRATE (PF) 250 MCG/5ML IJ SOLN
INTRAMUSCULAR | Status: AC
  Filled 2016-12-28: qty 5

## 2016-12-28 MED ORDER — BUPIVACAINE LIPOSOME 1.3 % IJ SUSP
INTRAMUSCULAR | Status: AC
  Filled 2016-12-28: qty 20

## 2016-12-28 MED ORDER — CEFAZOLIN SODIUM-DEXTROSE 2-4 GM/100ML-% IV SOLN
INTRAVENOUS | Status: AC
  Filled 2016-12-28: qty 100

## 2016-12-28 MED ORDER — ONDANSETRON HCL 4 MG/2ML IJ SOLN
INTRAMUSCULAR | Status: AC
  Filled 2016-12-28: qty 2

## 2016-12-28 SURGICAL SUPPLY — 48 items
BAG HAMPER (MISCELLANEOUS) ×3 IMPLANT
BLADE SURG SZ11 CARB STEEL (BLADE) ×3 IMPLANT
CHLORAPREP W/TINT 26ML (MISCELLANEOUS) ×3 IMPLANT
CLOTH BEACON ORANGE TIMEOUT ST (SAFETY) ×3 IMPLANT
COVER LIGHT HANDLE STERIS (MISCELLANEOUS) ×6 IMPLANT
DECANTER SPIKE VIAL GLASS SM (MISCELLANEOUS) ×2 IMPLANT
ELECT REM PT RETURN 9FT ADLT (ELECTROSURGICAL) ×3
ELECTRODE REM PT RTRN 9FT ADLT (ELECTROSURGICAL) ×1 IMPLANT
GAUZE SPONGE 4X4 12PLY STRL (GAUZE/BANDAGES/DRESSINGS) ×3 IMPLANT
GLOVE BIOGEL PI IND STRL 6.5 (GLOVE) IMPLANT
GLOVE BIOGEL PI IND STRL 7.0 (GLOVE) ×1 IMPLANT
GLOVE BIOGEL PI INDICATOR 6.5 (GLOVE) ×2
GLOVE BIOGEL PI INDICATOR 7.0 (GLOVE) ×2
GLOVE ECLIPSE 6.5 STRL STRAW (GLOVE) ×2 IMPLANT
GLOVE SURG SS PI 7.5 STRL IVOR (GLOVE) ×3 IMPLANT
GOWN STRL REUS W/ TWL LRG LVL3 (GOWN DISPOSABLE) ×1 IMPLANT
GOWN STRL REUS W/ TWL XL LVL3 (GOWN DISPOSABLE) ×1 IMPLANT
GOWN STRL REUS W/TWL LRG LVL3 (GOWN DISPOSABLE) ×3
GOWN STRL REUS W/TWL XL LVL3 (GOWN DISPOSABLE) ×3
INST SET MAJOR GENERAL (KITS) IMPLANT
INST SET MINOR GENERAL (KITS) ×2 IMPLANT
KIT ROOM TURNOVER APOR (KITS) ×3 IMPLANT
MANIFOLD NEPTUNE II (INSTRUMENTS) ×3 IMPLANT
MESH VENTRALEX ST 8CM LRG (Mesh General) ×2 IMPLANT
NDL HYPO 21X1.5 SAFETY (NEEDLE) ×1 IMPLANT
NEEDLE HYPO 21X1.5 SAFETY (NEEDLE) ×3 IMPLANT
NS IRRIG 1000ML POUR BTL (IV SOLUTION) ×3 IMPLANT
PACK ABDOMINAL MAJOR (CUSTOM PROCEDURE TRAY) IMPLANT
PACK MINOR (CUSTOM PROCEDURE TRAY) ×2 IMPLANT
PAD ARMBOARD 7.5X6 YLW CONV (MISCELLANEOUS) ×3 IMPLANT
SET BASIN LINEN APH (SET/KITS/TRAYS/PACK) ×3 IMPLANT
STAPLER VISISTAT (STAPLE) IMPLANT
SUT ETHIBOND 0 MO6 C/R (SUTURE) ×4 IMPLANT
SUT NOVA NAB GS-21 1 T12 (SUTURE) IMPLANT
SUT NOVA NAB GS-22 2 2-0 T-19 (SUTURE) IMPLANT
SUT NOVA NAB GS-26 0 60 (SUTURE) IMPLANT
SUT PROLENE 0 CT 1 CR/8 (SUTURE) IMPLANT
SUT SILK 2 0 (SUTURE)
SUT SILK 2-0 18XBRD TIE 12 (SUTURE) IMPLANT
SUT VIC AB 2-0 CT1 27 (SUTURE) ×3
SUT VIC AB 2-0 CT1 TAPERPNT 27 (SUTURE) ×1 IMPLANT
SUT VIC AB 3-0 SH 27 (SUTURE) ×3
SUT VIC AB 3-0 SH 27X BRD (SUTURE) ×1 IMPLANT
SUT VIC AB 4-0 PS2 27 (SUTURE) ×2 IMPLANT
SUT VICRYL AB 2 0 TIES (SUTURE) IMPLANT
SYR 20CC LL (SYRINGE) ×3 IMPLANT
TAPE HYPAFIX 6 X30' (GAUZE/BANDAGES/DRESSINGS) ×1
TAPE HYPAFIX 6X30 (GAUZE/BANDAGES/DRESSINGS) ×1 IMPLANT

## 2016-12-28 NOTE — Transfer of Care (Signed)
Immediate Anesthesia Transfer of Care Note  Patient: Sara Fischer  Procedure(s) Performed: HERNIA REPAIR INCISIONAL (N/A )  Patient Location: PACU  Anesthesia Type:General  Level of Consciousness: awake and patient cooperative  Airway & Oxygen Therapy: Patient Spontanous Breathing and Patient connected to nasal cannula oxygen  Post-op Assessment: Report given to RN and Post -op Vital signs reviewed and stable  Post vital signs: Reviewed and stable  Last Vitals:  Vitals:   12/28/16 0515 12/28/16 1133  BP: (!) 185/74   Pulse: 75 63  Resp: 18   Temp:  37 C  SpO2: 96%     Last Pain:  Vitals:   12/28/16 1133  TempSrc: Oral  PainSc:       Patients Stated Pain Goal: 7 (00/37/04 8889)  Complications: No apparent anesthesia complications

## 2016-12-28 NOTE — Interval H&P Note (Signed)
History and Physical Interval Note:  12/28/2016 11:34 AM  Sara Fischer  has presented today for surgery, with the diagnosis of incisional hernia  The various methods of treatment have been discussed with the patient and family. After consideration of risks, benefits and other options for treatment, the patient has consented to  Procedure(s): HERNIA REPAIR INCISIONAL (N/A) as a surgical intervention .  The patient's history has been reviewed, patient examined, no change in status, stable for surgery.  I have reviewed the patient's chart and labs.  Questions were answered to the patient's satisfaction.     Aviva Signs

## 2016-12-28 NOTE — H&P (View-Only) (Signed)
Subjective: Patient had a bowel movement yesterday. No nausea or vomiting noted.  Objective: Vital signs in last 24 hours: Temp:  [98.1 F (36.7 C)-99.2 F (37.3 C)] 98.1 F (36.7 C) (10/18 0500) Pulse Rate:  [64-75] 68 (10/18 0500) Resp:  [16-18] 18 (10/18 0500) BP: (148-151)/(52-63) 150/63 (10/18 0500) SpO2:  [92 %-99 %] 97 % (10/18 0500) Weight:  [162 lb (73.5 kg)] 162 lb (73.5 kg) (10/18 0500) Last BM Date: 12/26/16  Intake/Output from previous day: 10/17 0701 - 10/18 0700 In: 4910 [P.O.:100; I.V.:4810] Out: 650 [Urine:650] Intake/Output this shift: No intake/output data recorded.  General appearance: alert, cooperative and no distress Resp: clear to auscultation bilaterally Cardio: regular rate and rhythm, S1, S2 normal, no murmur, click, rub or gallop GI: Soft, nontender, nondistended. Reducible lower midline incisional hernia present. Bowel sounds present.  Lab Results:   Recent Labs  12/26/16 0546 12/27/16 0416  WBC 10.7* 5.3  HGB 11.9* 10.5*  HCT 35.7* 32.2*  PLT 204 147*   BMET  Recent Labs  12/26/16 0546 12/27/16 0416  NA 142 142  K 3.7 3.6  CL 107 110  CO2 27 27  GLUCOSE 130* 89  BUN 20 14  CREATININE 0.58 0.52  CALCIUM 9.0 8.6*   PT/INR No results for input(s): LABPROT, INR in the last 72 hours.  Studies/Results: Ct Abdomen Pelvis W Contrast  Result Date: 12/26/2016 CLINICAL DATA:  Abdominal pain with nausea and vomiting starting 2 hours ago. EXAM: CT ABDOMEN AND PELVIS WITH CONTRAST TECHNIQUE: Multidetector CT imaging of the abdomen and pelvis was performed using the standard protocol following bolus administration of intravenous contrast. CONTRAST:  115mL ISOVUE-300 IOPAMIDOL (ISOVUE-300) INJECTION 61% COMPARISON:  None. FINDINGS: Lower chest: The lung bases are clear. Moderate-sized esophageal hiatal hernia. Hepatobiliary: No focal liver abnormality is seen. No gallstones, gallbladder wall thickening, or biliary dilatation. Pancreas:  Unremarkable. No pancreatic ductal dilatation or surrounding inflammatory changes. Spleen: Normal in size without focal abnormality. Adrenals/Urinary Tract: Adrenal glands are unremarkable. Kidneys are normal, without renal calculi, focal lesion, or hydronephrosis. Bladder is unremarkable. Stomach/Bowel: Stomach and small bowel are diffusely fluid-filled. Mildly distended proximal small bowel. There is a anterior abdominal wall hernia in the low pelvis containing multiple loops of small bowel. Small bowel exiting the hernia are decompressed. This is consistent with small bowel obstruction due to the hernia. The colon is mostly decompressed with diffusely scattered colonic diverticula. No inflammatory changes. Appendix is not identified. Vascular/Lymphatic: Aortic atherosclerosis. No enlarged abdominal or pelvic lymph nodes. Reproductive: Status post hysterectomy. No adnexal masses. Other: No free air or free fluid in the abdomen. Musculoskeletal: Degenerative changes in the spine. Lower thoracic vertebral compression deformities appear chronic. Vertebral hemangiomas. Degenerative changes in the hips. IMPRESSION: 1. Low anterior pelvic wall hernia containing small bowel with associated small bowel obstruction at the level of the hernia. 2. Moderate-sized esophageal hiatal hernia. 3. Aortic atherosclerosis. Electronically Signed   By: Lucienne Capers M.D.   On: 12/26/2016 00:12   Dg Chest Port 1 View  Result Date: 12/26/2016 CLINICAL DATA:  NG tube placement EXAM: PORTABLE CHEST 1 VIEW COMPARISON:  11/08/2010 FINDINGS: Enteric tube tip is in the left upper quadrant consistent with location in the upper stomach. Cardiac enlargement without vascular congestion or edema. No focal consolidation. No blunting of costophrenic angles. Mediastinal contours appear intact. IMPRESSION: Enteric tube tip is in the left upper quadrant consistent with location in the upper stomach. No evidence of active pulmonary disease.  Electronically Signed   By: Gwyndolyn Saxon  Gerilyn Nestle M.D.   On: 12/26/2016 05:05    Anti-infectives: Anti-infectives    None      Assessment/Plan: Impression: Small bowel obstruction secondary to incisional hernia. Bowel obstruction symptoms have resolved. Plan: Will proceed with incisional herniorrhaphy with mesh tomorrow. The risks and benefits of the procedure including bleeding, infection, mesh use, and the possibility of recurrence of the hernia were fully explained to the patient, who gave informed consent.  LOS: 1 day    Aviva Signs 12/27/2016

## 2016-12-28 NOTE — Progress Notes (Signed)
Pt being transported down to OR via staff.

## 2016-12-28 NOTE — Anesthesia Preprocedure Evaluation (Signed)
Anesthesia Evaluation  Patient identified by MRN, date of birth, ID band Patient awake    Reviewed: Allergy & Precautions, NPO status , Patient's Chart, lab work & pertinent test results  Airway Mallampati: I  TM Distance: >3 FB     Dental  (+) Edentulous Upper, Edentulous Lower   Pulmonary neg pulmonary ROS, former smoker,    breath sounds clear to auscultation       Cardiovascular hypertension, Pt. on medications  Rhythm:Regular Rate:Normal     Neuro/Psych negative neurological ROS  negative psych ROS   GI/Hepatic negative GI ROS, Neg liver ROS,   Endo/Other  negative endocrine ROS  Renal/GU      Musculoskeletal   Abdominal   Peds  Hematology negative hematology ROS (+)   Anesthesia Other Findings   Reproductive/Obstetrics                             Anesthesia Physical Anesthesia Plan  ASA: II  Anesthesia Plan: General   Post-op Pain Management:    Induction: Intravenous  PONV Risk Score and Plan:   Airway Management Planned: Oral ETT  Additional Equipment:   Intra-op Plan:   Post-operative Plan: Extubation in OR  Informed Consent: I have reviewed the patients History and Physical, chart, labs and discussed the procedure including the risks, benefits and alternatives for the proposed anesthesia with the patient or authorized representative who has indicated his/her understanding and acceptance.     Plan Discussed with:   Anesthesia Plan Comments:         Anesthesia Quick Evaluation

## 2016-12-28 NOTE — Anesthesia Procedure Notes (Signed)
Procedure Name: Intubation Date/Time: 12/28/2016 1:15 PM Performed by: Tressie Stalker E Pre-anesthesia Checklist: Patient identified, Patient being monitored, Timeout performed, Emergency Drugs available and Suction available Patient Re-evaluated:Patient Re-evaluated prior to induction Oxygen Delivery Method: Circle system utilized Preoxygenation: Pre-oxygenation with 100% oxygen Induction Type: IV induction Ventilation: Mask ventilation without difficulty Laryngoscope Size: Mac and 3 Grade View: Grade I Tube type: Oral Tube size: 7.0 mm Number of attempts: 1 Airway Equipment and Method: Stylet Placement Confirmation: ETT inserted through vocal cords under direct vision,  positive ETCO2 and breath sounds checked- equal and bilateral Secured at: 21 cm Tube secured with: Tape Dental Injury: Teeth and Oropharynx as per pre-operative assessment

## 2016-12-28 NOTE — Op Note (Signed)
Patient:  Sara Fischer  DOB:  August 29, 1940  MRN:  [id number]   Preop Diagnosis:  Incisional hernia  Postop Diagnosis:  same  Procedure:  Incisional herniorrhaphy with mesh  Surgeon:  Aviva Signs, M.D.  Asst.: Curlene Labrum, M.D.  Anes:  General endotracheal  Indications:  Patient is a 76 year old white female who presented with a small bowel obstruction secondary to an incisional hernia. She now presents for an incisional herniorrhaphy with mesh. The risks and benefits of the procedure including bleeding, infection, mesh use, the possibility of recurrence of the hernia were fully explained to the patient, who gave informed consent.  Procedure note:  The patient was placed in the supine position. After induction of general endotracheal anesthesia, the abdomen was prepped and draped using usual sterile technique with DuraPrep. Surgical site confirmation was performed.  A suprapubic midline incision was made through a previous surgical scar. This was taken down to the fascia. The patient was noted to have several loculated hernia sacs above the fascia. The fascial defect was approximately 6 cm in its greatest diameter. It extended right to the pubic tubercle.The subcutaneous hernia wall was excised without difficulty. It was disposed of. An 8 cm Bard Ventralax ST atch was then inserted and secured to the fascia using an 0 Ethibond interrupted suture. The midline fascia was then reapproximated over the mesh using 0 Ethibond interrupted sutures. The subcutaneous layer was reapproximated using a 2-0 Vicryl interrupted suture. Exparel was instilled into the surrounding wound. The skin was closed using staples. Betadine ointment and dry sterile dressings were applied.  All tape and needle counts were correct at the end the procedure. The patient was extubated in the operating room and transferred to PACU in stable condition.  Complications:  none  EBL:  minimal  Specimen:  none

## 2016-12-28 NOTE — Anesthesia Postprocedure Evaluation (Signed)
Anesthesia Post Note  Patient: Sara Fischer  Procedure(s) Performed: HERNIA REPAIR INCISIONAL (N/A )  Patient location during evaluation: PACU Anesthesia Type: General Level of consciousness: awake and alert and oriented Pain management: pain level controlled Vital Signs Assessment: post-procedure vital signs reviewed and stable Respiratory status: spontaneous breathing Cardiovascular status: blood pressure returned to baseline and stable Postop Assessment: no apparent nausea or vomiting Anesthetic complications: no     Last Vitals:  Vitals:   12/28/16 1430 12/28/16 1440  BP: (!) 147/75 (!) 164/64  Pulse: 61 (!) 58  Resp: 15 17  Temp:    SpO2: 100% 97%    Last Pain:  Vitals:   12/28/16 1440  TempSrc:   PainSc: 3                  Brieonna Crutcher

## 2016-12-29 LAB — CBC
HEMATOCRIT: 32.5 % — AB (ref 36.0–46.0)
HEMOGLOBIN: 10.8 g/dL — AB (ref 12.0–15.0)
MCH: 30.7 pg (ref 26.0–34.0)
MCHC: 33.2 g/dL (ref 30.0–36.0)
MCV: 92.3 fL (ref 78.0–100.0)
Platelets: 188 10*3/uL (ref 150–400)
RBC: 3.52 MIL/uL — AB (ref 3.87–5.11)
RDW: 12.7 % (ref 11.5–15.5)
WBC: 8.9 10*3/uL (ref 4.0–10.5)

## 2016-12-29 LAB — BASIC METABOLIC PANEL
ANION GAP: 8 (ref 5–15)
BUN: 13 mg/dL (ref 6–20)
CHLORIDE: 108 mmol/L (ref 101–111)
CO2: 29 mmol/L (ref 22–32)
Calcium: 9.7 mg/dL (ref 8.9–10.3)
Creatinine, Ser: 0.55 mg/dL (ref 0.44–1.00)
GFR calc Af Amer: >60 mL/min (ref >60)
GLUCOSE: 104 mg/dL — AB (ref 65–99)
POTASSIUM: 3.9 mmol/L (ref 3.5–5.1)
Sodium: 145 mmol/L (ref 135–145)

## 2016-12-29 LAB — PHOSPHORUS: PHOSPHORUS: 3.4 mg/dL (ref 2.5–4.6)

## 2016-12-29 LAB — MAGNESIUM: Magnesium: 1.8 mg/dL (ref 1.7–2.4)

## 2016-12-29 MED ORDER — HYDROCODONE-ACETAMINOPHEN 5-325 MG PO TABS: 1.0000 | ORAL_TABLET | Freq: Four times a day (QID) | ORAL | 0 refills | Status: DC | PRN

## 2016-12-29 NOTE — Discharge Instructions (Signed)
Open Hernia Repair, Adult, Care After °These instructions give you information about caring for yourself after your procedure. Your doctor may also give you more specific instructions. If you have problems or questions, contact your doctor. °Follow these instructions at home: °Surgical cut (incision) care  ° °· Follow instructions from your doctor about how to take care of your surgical cut area. Make sure you: °¨ Wash your hands with soap and water before you change your bandage (dressing). If you cannot use soap and water, use hand sanitizer. °¨ Change your bandage as told by your doctor. °¨ Leave stitches (sutures), skin glue, or skin tape (adhesive) strips in place. They may need to stay in place for 2 weeks or longer. If tape strips get loose and curl up, you may trim the loose edges. Do not remove tape strips completely unless your doctor says it is okay. °· Check your surgical cut every day for signs of infection. Check for: °¨ More redness, swelling, or pain. °¨ More fluid or blood. °¨ Warmth. °¨ Pus or a bad smell. °Activity  °· Do not drive or use heavy machinery while taking prescription pain medicine. Do not drive until your doctor says it is okay. °· Until your doctor says it is okay: °¨ Do not lift anything that is heavier than 10 lb (4.5 kg). °¨ Do not play contact sports. °· Return to your normal activities as told by your doctor. Ask your doctor what activities are safe. °General instructions  °· To prevent or treat having a hard time pooping (constipation) while you are taking prescription pain medicine, your doctor may recommend that you: °¨ Drink enough fluid to keep your pee (urine) clear or pale yellow. °¨ Take over-the-counter or prescription medicines. °¨ Eat foods that are high in fiber, such as fresh fruits and vegetables, whole grains, and beans. °¨ Limit foods that are high in fat and processed sugars, such as fried and sweet foods. °· Take over-the-counter and prescription medicines only  as told by your doctor. °· Do not take baths, swim, or use a hot tub until your doctor says it is okay. °· Keep all follow-up visits as told by your doctor. This is important. °Contact a doctor if: °· You develop a rash. °· You have more redness, swelling, or pain around your surgical cut. °· You have more fluid or blood coming from your surgical cut. °· Your surgical cut feels warm to the touch. °· You have pus or a bad smell coming from your surgical cut. °· You have a fever or chills. °· You have blood in your poop (stool). °· You have not pooped in 2-3 days. °· Medicine does not help your pain. °Get help right away if: °· You have chest pain or you are short of breath. °· You feel light-headed. °· You feel weak and dizzy (feel faint). °· You have very bad pain. °· You throw up (vomit) and your pain is worse. °This information is not intended to replace advice given to you by your health care provider. Make sure you discuss any questions you have with your health care provider. °Document Released: 03/19/2014 Document Revised: 09/16/2015 Document Reviewed: 08/10/2015 °Elsevier Interactive Patient Education © 2017 [employer] ° °

## 2016-12-29 NOTE — Discharge Summary (Signed)
Physician Discharge Summary  Patient ID: Sara Fischer MRN: [id number] DOB/AGE: 1940-04-26 76 y.o.  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 CT scan of the abdomen revealed a small bowel obstruction secondary to an incisional hernia which was reducible. The patient was admitted to the hospital on 12/25/2016. Surgery was consulted. The patient subsequently underwent an incisional herniorrhaphy with mesh on 12/28/2016. She tolerated the procedure well. Her postoperative course is unremarkable. Her diet was advanced without difficulty. The patient is being discharged home on 12/29/2016 in good and improving condition.  Treatments: surgery: Incisional herniorrhaphy with mesh on 12/28/2016  Discharge Exam: Blood pressure (!) 197/53, pulse 62, temperature 98.4 F (36.9 C), temperature source Oral, resp. rate 18, height _0  (1.575 m), weight 160 lb 1.6 oz (72.6 kg), SpO2 96 %. General appearance: alert, cooperative and no distress Resp: clear to auscultation bilaterally Cardio: regular rate and rhythm, S1, S2 normal, no murmur, click, rub or gallop GI: Soft, bowel sounds active. Incision healing well.  Disposition: 01-Home or Self Care  Discharge Instructions    Diet - low sodium heart healthy    Complete by:  As directed    Increase activity slowly    Complete by:  As directed      Allergies as of 12/29/2016      Reactions   Feldene [piroxicam]    Headaches    Lodine [etodolac]    headaches      Medication List    TAKE these medications   aspirin EC 81 MG tablet Take 81 mg by mouth  daily.   BIOTIN PO Take 1 tablet by mouth daily.   D3-1000 PO Take 1,000 capsules by mouth daily.   FLAX SEEDS PO Take 1 capsule by mouth daily.   fluticasone 50 MCG/ACT nasal spray Commonly known as:  FLONASE Place 1 spray into both nostrils daily as needed for allergies or rhinitis.   furosemide 40 MG tablet Commonly known as:  LASIX Take 40 mg by mouth daily as needed for fluid or edema.   HYDROcodone-acetaminophen 5-325 MG tablet Commonly known as:  NORCO Take 1 tablet by mouth every 6 (six) hours as needed for moderate pain.   KRILL OIL PO Take 1 capsule by mouth daily.   metoprolol succinate 100 MG 24 hr tablet Commonly known as:  TOPROL-XL Take 100 mg by mouth daily. Take with or immediately following a meal.   Na Sulfate-K Sulfate-Mg Sulf 17.5-3.13-1.6 GM/177ML Soln Commonly known as:  SUPREP BOWEL PREP KIT Take 1 kit by mouth as directed.   naproxen sodium 220 MG tablet Commonly known as:  ANAPROX Take 220 mg by mouth daily as needed (pain).   simvastatin 20 MG tablet Commonly known as:  ZOCOR Take 20 mg by mouth at bedtime.      Follow-up Information    Aviva Signs, MD. Schedule an appointment as soon as possible for a visit on 01/08/2017.   Specialty:  General Surgery Contact information: [id number] [place] [address] [phone]           Signed: Aviva Signs 12/29/2016, 9:34 AM

## 2016-12-31 ENCOUNTER — Encounter (HOSPITAL_COMMUNITY): Payer: Self-pay | Admitting: General Surgery

## 2017-01-08 ENCOUNTER — Encounter: Payer: Self-pay | Admitting: General Surgery

## 2017-01-08 ENCOUNTER — Ambulatory Visit (INDEPENDENT_AMBULATORY_CARE_PROVIDER_SITE_OTHER): Payer: Self-pay | Admitting: General Surgery

## 2017-01-08 VITALS — BP 155/84 | HR 84 | Temp 98.4°F | Resp 18 | Ht 62.0 in | Wt 160.0 lb

## 2017-01-08 DIAGNOSIS — Z09 Encounter for follow-up examination after completed treatment for conditions other than malignant neoplasm: Secondary | ICD-10-CM

## 2017-01-08 NOTE — Progress Notes (Signed)
Subjective:     Sara Fischer  Status post incisional herniorrhaphy with mesh.  Patient doing very well.  Has had no problems. Objective:    BP (!) 155/84   Pulse 84   Temp 98.4 F (36.9 C)   Resp 18   Ht 5\' 2"  (1.575 m)   Wt 160 lb (72.6 kg)   BMI 29.26 kg/m   General:  alert, cooperative and no distress  Abdomen soft, incision healing well.  Staples removed, Steri-Strips applied.     Assessment:    Doing well postoperatively.    Plan:   Increase activity as able.  Follow-up here as needed.

## 2017-01-10 DIAGNOSIS — E663 Overweight: Secondary | ICD-10-CM | POA: Diagnosis not present

## 2017-01-10 DIAGNOSIS — Z1389 Encounter for screening for other disorder: Secondary | ICD-10-CM | POA: Diagnosis not present

## 2017-01-10 DIAGNOSIS — K430 Incisional hernia with obstruction, without gangrene: Secondary | ICD-10-CM | POA: Diagnosis not present

## 2017-01-10 DIAGNOSIS — Z6829 Body mass index (BMI) 29.0-29.9, adult: Secondary | ICD-10-CM | POA: Diagnosis not present

## 2017-01-10 DIAGNOSIS — E782 Mixed hyperlipidemia: Secondary | ICD-10-CM | POA: Diagnosis not present

## 2017-01-28 ENCOUNTER — Encounter (HOSPITAL_COMMUNITY): Payer: Self-pay

## 2017-01-28 ENCOUNTER — Ambulatory Visit (HOSPITAL_COMMUNITY)
Admission: RE | Admit: 2017-01-28 | Discharge: 2017-01-28 | Disposition: A | Discharge status: Home / Self Care | Payer: Medicare HMO | Source: Ambulatory Visit | Attending: Internal Medicine | Admitting: Internal Medicine

## 2017-01-28 DIAGNOSIS — Z1231 Encounter for screening mammogram for malignant neoplasm of breast: Secondary | ICD-10-CM | POA: Diagnosis not present

## 2017-01-28 DIAGNOSIS — M810 Age-related osteoporosis without current pathological fracture: Secondary | ICD-10-CM | POA: Diagnosis not present

## 2017-01-28 DIAGNOSIS — M8588 Other specified disorders of bone density and structure, other site: Secondary | ICD-10-CM | POA: Diagnosis not present

## 2017-01-28 DIAGNOSIS — E2839 Other primary ovarian failure: Secondary | ICD-10-CM | POA: Diagnosis not present

## 2017-01-30 DIAGNOSIS — E538 Deficiency of other specified B group vitamins: Secondary | ICD-10-CM | POA: Diagnosis not present

## 2017-02-25 DIAGNOSIS — E538 Deficiency of other specified B group vitamins: Secondary | ICD-10-CM | POA: Diagnosis not present

## 2017-03-27 DIAGNOSIS — E538 Deficiency of other specified B group vitamins: Secondary | ICD-10-CM | POA: Diagnosis not present

## 2017-04-22 DIAGNOSIS — E663 Overweight: Secondary | ICD-10-CM | POA: Diagnosis not present

## 2017-04-22 DIAGNOSIS — D649 Anemia, unspecified: Secondary | ICD-10-CM | POA: Diagnosis not present

## 2017-04-22 DIAGNOSIS — Z1389 Encounter for screening for other disorder: Secondary | ICD-10-CM | POA: Diagnosis not present

## 2017-04-22 DIAGNOSIS — Z0001 Encounter for general adult medical examination with abnormal findings: Secondary | ICD-10-CM | POA: Diagnosis not present

## 2017-04-22 DIAGNOSIS — H6123 Impacted cerumen, bilateral: Secondary | ICD-10-CM | POA: Diagnosis not present

## 2017-04-22 DIAGNOSIS — Z6828 Body mass index (BMI) 28.0-28.9, adult: Secondary | ICD-10-CM | POA: Diagnosis not present

## 2017-04-22 DIAGNOSIS — E6609 Other obesity due to excess calories: Secondary | ICD-10-CM | POA: Diagnosis not present

## 2017-04-22 DIAGNOSIS — Z0000 Encounter for general adult medical examination without abnormal findings: Secondary | ICD-10-CM | POA: Diagnosis not present

## 2017-04-22 DIAGNOSIS — E782 Mixed hyperlipidemia: Secondary | ICD-10-CM | POA: Diagnosis not present

## 2017-04-29 DIAGNOSIS — D510 Vitamin B12 deficiency anemia due to intrinsic factor deficiency: Secondary | ICD-10-CM | POA: Diagnosis not present

## 2017-05-27 DIAGNOSIS — E538 Deficiency of other specified B group vitamins: Secondary | ICD-10-CM | POA: Diagnosis not present

## 2017-05-30 DIAGNOSIS — G4700 Insomnia, unspecified: Secondary | ICD-10-CM | POA: Diagnosis not present

## 2017-05-30 DIAGNOSIS — Z6829 Body mass index (BMI) 29.0-29.9, adult: Secondary | ICD-10-CM | POA: Diagnosis not present

## 2017-05-30 DIAGNOSIS — L989 Disorder of the skin and subcutaneous tissue, unspecified: Secondary | ICD-10-CM | POA: Diagnosis not present

## 2017-06-27 DIAGNOSIS — E538 Deficiency of other specified B group vitamins: Secondary | ICD-10-CM | POA: Diagnosis not present

## 2017-07-19 DIAGNOSIS — E663 Overweight: Secondary | ICD-10-CM | POA: Diagnosis not present

## 2017-07-19 DIAGNOSIS — Z6828 Body mass index (BMI) 28.0-28.9, adult: Secondary | ICD-10-CM | POA: Diagnosis not present

## 2017-07-19 DIAGNOSIS — I10 Essential (primary) hypertension: Secondary | ICD-10-CM | POA: Diagnosis not present

## 2017-07-19 DIAGNOSIS — J329 Chronic sinusitis, unspecified: Secondary | ICD-10-CM | POA: Diagnosis not present

## 2017-07-19 DIAGNOSIS — Z1389 Encounter for screening for other disorder: Secondary | ICD-10-CM | POA: Diagnosis not present

## 2017-07-25 DIAGNOSIS — E538 Deficiency of other specified B group vitamins: Secondary | ICD-10-CM | POA: Diagnosis not present

## 2017-08-23 DIAGNOSIS — E538 Deficiency of other specified B group vitamins: Secondary | ICD-10-CM | POA: Diagnosis not present

## 2017-09-04 DIAGNOSIS — L570 Actinic keratosis: Secondary | ICD-10-CM | POA: Diagnosis not present

## 2017-09-04 DIAGNOSIS — Z85820 Personal history of malignant melanoma of skin: Secondary | ICD-10-CM | POA: Diagnosis not present

## 2017-09-04 DIAGNOSIS — Z85828 Personal history of other malignant neoplasm of skin: Secondary | ICD-10-CM | POA: Diagnosis not present

## 2017-09-04 DIAGNOSIS — L304 Erythema intertrigo: Secondary | ICD-10-CM | POA: Diagnosis not present

## 2017-09-24 DIAGNOSIS — E538 Deficiency of other specified B group vitamins: Secondary | ICD-10-CM | POA: Diagnosis not present

## 2017-10-02 DIAGNOSIS — Z6829 Body mass index (BMI) 29.0-29.9, adult: Secondary | ICD-10-CM | POA: Diagnosis not present

## 2017-10-02 DIAGNOSIS — E663 Overweight: Secondary | ICD-10-CM | POA: Diagnosis not present

## 2017-10-02 DIAGNOSIS — N644 Mastodynia: Secondary | ICD-10-CM | POA: Diagnosis not present

## 2017-10-02 DIAGNOSIS — G500 Trigeminal neuralgia: Secondary | ICD-10-CM | POA: Diagnosis not present

## 2017-10-25 DIAGNOSIS — E538 Deficiency of other specified B group vitamins: Secondary | ICD-10-CM | POA: Diagnosis not present

## 2017-11-03 ENCOUNTER — Encounter (HOSPITAL_COMMUNITY): Payer: Self-pay | Admitting: Emergency Medicine

## 2017-11-03 ENCOUNTER — Emergency Department (HOSPITAL_COMMUNITY)
Admission: EM | Admit: 2017-11-03 | Discharge: 2017-11-03 | Disposition: A | Discharge status: Home / Self Care | Payer: Medicare HMO | Attending: Emergency Medicine | Admitting: Emergency Medicine

## 2017-11-03 ENCOUNTER — Emergency Department (HOSPITAL_COMMUNITY): Payer: Medicare HMO

## 2017-11-03 ENCOUNTER — Other Ambulatory Visit: Payer: Self-pay

## 2017-11-03 DIAGNOSIS — Z7982 Long term (current) use of aspirin: Secondary | ICD-10-CM | POA: Diagnosis not present

## 2017-11-03 DIAGNOSIS — I10 Essential (primary) hypertension: Secondary | ICD-10-CM | POA: Insufficient documentation

## 2017-11-03 DIAGNOSIS — Z79899 Other long term (current) drug therapy: Secondary | ICD-10-CM | POA: Diagnosis not present

## 2017-11-03 DIAGNOSIS — R6884 Jaw pain: Secondary | ICD-10-CM | POA: Diagnosis present

## 2017-11-03 DIAGNOSIS — E041 Nontoxic single thyroid nodule: Secondary | ICD-10-CM

## 2017-11-03 DIAGNOSIS — Z87891 Personal history of nicotine dependence: Secondary | ICD-10-CM | POA: Diagnosis not present

## 2017-11-03 DIAGNOSIS — M26621 Arthralgia of right temporomandibular joint: Secondary | ICD-10-CM | POA: Diagnosis not present

## 2017-11-03 DIAGNOSIS — J039 Acute tonsillitis, unspecified: Secondary | ICD-10-CM | POA: Diagnosis not present

## 2017-11-03 LAB — BASIC METABOLIC PANEL
Anion gap: 8 (ref 5–15)
BUN: 20 mg/dL (ref 8–23)
CALCIUM: 9.3 mg/dL (ref 8.9–10.3)
CO2: 26 mmol/L (ref 22–32)
Chloride: 109 mmol/L (ref 98–111)
Creatinine, Ser: 0.58 mg/dL (ref 0.44–1.00)
GLUCOSE: 103 mg/dL — AB (ref 70–99)
Potassium: 3.6 mmol/L (ref 3.5–5.1)
SODIUM: 143 mmol/L (ref 135–145)

## 2017-11-03 LAB — CBC WITH DIFFERENTIAL/PLATELET
BASOS PCT: 0 %
Basophils Absolute: 0 10*3/uL (ref 0.0–0.1)
EOS ABS: 0.1 10*3/uL (ref 0.0–0.7)
EOS PCT: 2 %
HCT: 31.9 % — ABNORMAL LOW (ref 36.0–46.0)
Hemoglobin: 10.5 g/dL — ABNORMAL LOW (ref 12.0–15.0)
Lymphocytes Relative: 23 %
Lymphs Abs: 1.3 10*3/uL (ref 0.7–4.0)
MCH: 30.6 pg (ref 26.0–34.0)
MCHC: 32.9 g/dL (ref 30.0–36.0)
MCV: 93 fL (ref 78.0–100.0)
MONO ABS: 0.6 10*3/uL (ref 0.1–1.0)
Monocytes Relative: 10 %
Neutro Abs: 3.6 10*3/uL (ref 1.7–7.7)
Neutrophils Relative %: 65 %
PLATELETS: 205 10*3/uL (ref 150–400)
RBC: 3.43 MIL/uL — ABNORMAL LOW (ref 3.87–5.11)
RDW: 13.6 % (ref 11.5–15.5)
WBC: 5.6 10*3/uL (ref 4.0–10.5)

## 2017-11-03 MED ORDER — TRAMADOL HCL 50 MG PO TABS: 50.0000 mg | ORAL_TABLET | Freq: Four times a day (QID) | ORAL | 0 refills | Status: DC | PRN

## 2017-11-03 MED ORDER — TRAMADOL HCL 50 MG PO TABS
50.0000 mg | ORAL_TABLET | Freq: Once | ORAL | Status: AC
  Administered 2017-11-03: 50 mg via ORAL
  Filled 2017-11-03: qty 1

## 2017-11-03 MED ORDER — IOHEXOL 300 MG/ML  SOLN
75.0000 mL | Freq: Once | INTRAMUSCULAR | Status: AC | PRN
  Administered 2017-11-03: 75 mL via INTRAVENOUS

## 2017-11-03 NOTE — ED Triage Notes (Signed)
Pt c/o R sided jaw pain upon waking this morning. Can open her mouth a small amount. Had this happen before and was given a prescription but family cannot state what it was.

## 2017-11-03 NOTE — Discharge Instructions (Addendum)
Copy of your CT scan provided.  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  Also it raises concerns for thyroid nodule which will need follow-up through your primary care provider.  Make an appointment to follow-up with her primary care provider.  Also recommend following up with dentist for evaluation of how well your dentures are fitting.  Take the tramadol as needed for pain.

## 2017-11-03 NOTE — ED Provider Notes (Signed)
[facility] EMERGENCY DEPARTMENT Provider Note   CSN: [id number] Arrival date & time: 11/03/17  1315     History   Chief Complaint Chief Complaint  Patient presents with  . Jaw Pain    HPI Sara Fischer is a 77 y.o. female.  Patient with acute onset of right lower jaw pain that started about 11:00 this morning.  She had this happen in the past was seen by her primary care doctor was given a prescription kind to help but she continued to have pain in that area particularly with chewing with her dentures and for about the past month.  Today it was significantly worse.  Patient does not currently have her dentures in.  Patient also hypertensive upon presentation.  She does have a history of hypertension.  States she is not out of her medications but did not take any today.  No significant headache.  No trouble swallowing.  She feels as if she can open her jaw very far.  No neck pain.  No shortness of breath no chest pain.     Past Medical History:  Diagnosis Date  . High cholesterol   . Hypertension     Patient Active Problem List   Diagnosis Date Noted  . 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 SBO (small bowel obstruction) ([place]) 12/26/2016  . Hypertension 12/26/2016  . Abdominal wall hernia   . Heme positive stool 02/08/2016    Past Surgical History:  Procedure Laterality Date  . APPENDECTOMY    . COLONOSCOPY  2010   Dr. Gala Romney: pancolonic diverticulosis, sigmoid colon tubular adenoma removed.   . COLONOSCOPY N/A 02/29/2016   Procedure: COLONOSCOPY;  Surgeon: Daneil Dolin, MD;  Location: [facility] ENDO SUITE;  Service: Endoscopy;  Laterality: N/A;  230  . INCISIONAL HERNIA REPAIR N/A 12/28/2016   Procedure: HERNIA REPAIR INCISIONAL;  Surgeon: Aviva Signs, MD;  Location: [facility] ORS;  Service: General;  Laterality: N/A;  . KNEE ARTHROSCOPY     bilateral  . LAPAROSCOPIC BILATERAL SALPINGO OOPHERECTOMY  2001  . PARTIAL HYSTERECTOMY  1979     OB History    None      Home Medications    Prior to Admission medications   Medication Sig Start Date End Date Taking? Authorizing Provider  aspirin EC 81 MG tablet Take 81 mg by mouth daily.   Yes [provider]  Cholecalciferol (D3-1000 PO) Take 1,000 capsules by mouth daily.    Yes [provider]  Flaxseed, Linseed, (FLAX SEEDS PO) Take 1 capsule by mouth daily.    Yes [provider]  fluticasone (FLONASE) 50 MCG/ACT nasal spray Place 1 spray into both nostrils daily as needed for allergies or rhinitis.   Yes [provider]  furosemide (LASIX) 40 MG tablet Take 40 mg by mouth daily as needed for fluid or edema.    Yes [provider]  metoprolol succinate (TOPROL-XL) 100 MG 24 hr tablet Take 100 mg by mouth daily. Take with or immediately following a meal.   Yes [provider]  Oxcarbazepine (TRILEPTAL) 300 MG tablet Take 1 tablet by mouth 2 (two) times daily. 10/02/17  Yes [provider]  simvastatin (ZOCOR) 20 MG tablet Take 20 mg by mouth at bedtime.    Yes [provider]  traMADol (ULTRAM) 50 MG tablet Take 1 tablet (50 mg total) by mouth every 6 (six) hours as needed. 11/03/17   Sara Sorrow, MD    Family History Family  History  Problem Relation Age of Onset  . Diabetes Mother   . Heart disease Mother   . Heart disease Father 50  . Colon cancer Neg Hx     Social History Social History   Tobacco Use  . Smoking status: Former Smoker    Packs/day: 0.50    Years: 5.00    Pack years: 2.50    Types: Cigarettes    Start date: 08/28/1963    Last attempt to quit: 11/02/1968    Years since quitting: 49.0  . Smokeless tobacco: Never Used  Substance Use Topics  . Alcohol use: Yes    Alcohol/week: 0.0 standard drinks    Comment: wine occas.  . Drug use: No     Allergies   Feldene [piroxicam] and Lodine [etodolac]   Review of Systems Review of Systems  Constitutional: Negative for fever.  HENT:  Positive for dental problem. Negative for congestion, facial swelling, mouth sores, sinus pressure, sore throat and trouble swallowing.   Eyes: Negative for visual disturbance.  Respiratory: Negative for cough and shortness of breath.   Cardiovascular: Negative for chest pain and leg swelling.  Gastrointestinal: Negative for abdominal pain, nausea and vomiting.  Genitourinary: Negative for dysuria.  Musculoskeletal: Negative for back pain.  Skin: Negative for rash.  Neurological: Negative for headaches.  Hematological: Does not bruise/bleed easily.  Psychiatric/Behavioral: Negative for confusion.     Physical Exam Updated Vital Signs BP (!) 187/69 (BP Location: Left Arm)   Pulse 72   Temp 98.7 F (37.1 C) (Temporal)   Resp 20   SpO2 98%   Physical Exam  Constitutional: She is oriented to person, place, and time. She appears well-developed and well-nourished. No distress.  HENT:  Head: Normocephalic and atraumatic.  Right Ear: External ear normal.  Mouth/Throat: Oropharynx is clear and moist.  Patient with tenderness to palpation along her right lower jaw margin.  Patient does not have any teeth.  Mucous membranes that are appeared to be normal.  No palpable mass with the parotid gland or submandibular sub-lingular salivary glands.  No cervical adenopathy.  Patient able to open her mouth.  It is with some pain.  No significant trismus.  Eyes: Pupils are equal, round, and reactive to light. Conjunctivae and EOM are normal.  Neck: Normal range of motion. Neck supple. No tracheal deviation present.  Cardiovascular: Normal rate, regular rhythm and normal heart sounds.  Pulmonary/Chest: Effort normal and breath sounds normal. No respiratory distress.  Abdominal: Soft. Bowel sounds are normal. There is no tenderness.  Musculoskeletal: Normal range of motion. She exhibits no edema.  Lymphadenopathy:    She has no cervical adenopathy.  Neurological: She is alert and oriented to person,  place, and time. No cranial nerve deficit or sensory deficit. She exhibits normal muscle tone. Coordination normal.  Skin: Skin is warm. Capillary refill takes less than 2 seconds. No rash noted. No erythema.  Nursing note and vitals reviewed.    ED Treatments / Results  Labs (all labs ordered are listed, but only abnormal results are displayed) Labs Reviewed  CBC WITH DIFFERENTIAL/PLATELET - Abnormal; Notable for the following components:      Result Value   RBC 3.43 (*)    Hemoglobin 10.5 (*)    HCT 31.9 (*)    All other components within normal limits  BASIC METABOLIC PANEL - Abnormal; Notable for the following components:   Glucose, Bld 103 (*)    All other components within normal limits    EKG  EKG Interpretation  Date/Time:  Sunday November 03 2017 13:57:12 EDT Ventricular Rate:  69 PR Interval:    QRS Duration: 91 QT Interval:  386 QTC Calculation: 414 R Axis:   51 Text Interpretation:  Sinus rhythm Confirmed by Sara Fischer [phone]) on 11/03/2017 3:55:43 PM   Radiology Ct Soft Tissue Neck W Contrast  Result Date: 11/03/2017 CLINICAL DATA:  Sore throat/stridor, epiglottitis or tonsillitis suspected. Trismus. Unable open-mouth. Right jaw pain. EXAM: CT NECK WITH CONTRAST TECHNIQUE: Multidetector CT imaging of the neck was performed using the standard protocol following the bolus administration of intravenous contrast. CONTRAST:  13mL OMNIPAQUE IOHEXOL 300 MG/ML  SOLN COMPARISON:  None. FINDINGS: Pharynx and larynx: No focal mucosal or submucosal lesions are present. Oropharyngeal tonsils are within normal limits. Tongue base is normal. Epiglottis and valleculae are within normal limits. Hypopharynx is unremarkable. Vocal cords are midline and symmetric. Trachea is normal. Salivary glands: The submandibular and parotid glands are within normal limits bilaterally. Thyroid: An exophytic thyroid nodule extends superiorly from the right lobe measuring 14 x 10 x 17 mm. There is  some heterogeneity near the upper pole of the right lobe separate from this lesion. No other discrete lesions are present. Lymph nodes: No significant cervical adenopathy is present. Vascular: Atherosclerotic calcifications are present within the right internal carotid artery and at the right carotid bifurcation. Calcifications are present at origin of the left subclavian artery. There is no significant stenosis. The distal cervical right ICA is tortuous without stenosis. Limited intracranial: Within normal limits. Visualized orbits: Bilateral lens extractions are present. Globes and orbits are otherwise within normal limits. Mastoids and visualized paranasal sinuses: Fluid level is present right maxillary sinus. Chronic wall thickening is present in both maxillary sinuses. Minimal mucosal thickening is noted inferiorly on the left. Mastoid air cells are clear bilaterally. Skeleton: Endplate degenerative changes in the cervical spine are most pronounced at C6-7 and to a lesser extent C5-6. Patient is edentulous. Degenerative changes at the TMJ are worse on the right. Muscles of mastication unremarkable. Upper chest: The lung apices are clear. Other: None. IMPRESSION: 1. 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 2. No other acute or focal lesion to explain trismus. 3. High so dense lesion just superior to the right lobe of the thyroid measures 14 x 10 x 17 mm. This likely represents an exophytic nodule. Recommend nonemergent thyroid ultrasound for further evaluation. These results were called by telephone at the time of interpretation on 11/03/2017 at 6:40 pm to Dr. Thurnell Garbe, who verbally acknowledged these results. Electronically Signed   By: San Morelle M.D.   On: 11/03/2017 18:44    Procedures Procedures (including critical care time)  Medications Ordered in ED Medications  traMADol (ULTRAM) tablet 50 mg (50 mg Oral Given 11/03/17 1740)  iohexol (OMNIPAQUE) 300 MG/ML solution 75 mL (75 mLs  Intravenous Contrast Given 11/03/17 1811)     Initial Impression / Assessment and Plan / ED Course  I have reviewed the triage vital signs and the nursing notes.  Pertinent labs & imaging results that were available during my care of the patient were reviewed by me and considered in my medical decision making (see chart for details).    Patient's work-up for the jaw pain seems to be secondary to temporomandibular joint pain particular on the right side.  There is evidence of a lot of abnormalities there.  Still possible could be due to a ill fitting dentures and she has had a lot of pain on the  right side and difficult to chew on that side.  But I bet it secondary to TMJ.  No other significant findings on CT soft tissue neck with contrast.  Patient's evaluation of her oral cavity without any significant abnormalities.  Recommending follow-up with primary care doctor as well as reevaluation by dentist to make sure the dentures are fitting properly.  Patient treated here with tramadol with significant improvement.  Prescription provided with tramadol so she can follow-up with her regular doctor.  Patient's blood pressures were elevated here the whole time.  She will need to follow-up with her primary care doctor for that as well.   In addition there was incidental finding of a thyroid nodule.  Information provided patient she will need follow-up with her primary care doctor.  Patient blood pressure was also elevated here.  Will need close follow-up.  Final Clinical Impressions(s) / ED Diagnoses   Final diagnoses:  Arthralgia of right temporomandibular joint  Thyroid nodule    ED Discharge Orders         Ordered    traMADol (ULTRAM) 50 MG tablet  Every 6 hours PRN     11/03/17 1957           Sara Sorrow, MD 11/04/17 [phone]

## 2017-11-04 DIAGNOSIS — Z1389 Encounter for screening for other disorder: Secondary | ICD-10-CM | POA: Diagnosis not present

## 2017-11-04 DIAGNOSIS — E041 Nontoxic single thyroid nodule: Secondary | ICD-10-CM | POA: Diagnosis not present

## 2017-11-04 DIAGNOSIS — Z6829 Body mass index (BMI) 29.0-29.9, adult: Secondary | ICD-10-CM | POA: Diagnosis not present

## 2017-11-04 DIAGNOSIS — E663 Overweight: Secondary | ICD-10-CM | POA: Diagnosis not present

## 2017-11-12 DIAGNOSIS — M26621 Arthralgia of right temporomandibular joint: Secondary | ICD-10-CM | POA: Diagnosis not present

## 2017-11-12 DIAGNOSIS — R221 Localized swelling, mass and lump, neck: Secondary | ICD-10-CM | POA: Diagnosis not present

## 2017-11-15 ENCOUNTER — Other Ambulatory Visit: Payer: Self-pay | Admitting: Otolaryngology

## 2017-11-15 DIAGNOSIS — R221 Localized swelling, mass and lump, neck: Secondary | ICD-10-CM

## 2017-11-20 ENCOUNTER — Ambulatory Visit
Admission: RE | Admit: 2017-11-20 | Discharge: 2017-11-20 | Disposition: A | Discharge status: Home / Self Care | Payer: Medicare HMO | Source: Ambulatory Visit | Attending: Otolaryngology | Admitting: Otolaryngology

## 2017-11-20 DIAGNOSIS — R221 Localized swelling, mass and lump, neck: Secondary | ICD-10-CM | POA: Diagnosis not present

## 2017-11-26 DIAGNOSIS — E538 Deficiency of other specified B group vitamins: Secondary | ICD-10-CM | POA: Diagnosis not present

## 2017-11-26 DIAGNOSIS — Z23 Encounter for immunization: Secondary | ICD-10-CM | POA: Diagnosis not present

## 2017-12-05 ENCOUNTER — Other Ambulatory Visit (HOSPITAL_COMMUNITY): Payer: Self-pay | Admitting: Otolaryngology

## 2017-12-05 DIAGNOSIS — R221 Localized swelling, mass and lump, neck: Secondary | ICD-10-CM

## 2017-12-09 ENCOUNTER — Other Ambulatory Visit: Payer: Self-pay | Admitting: Radiology

## 2017-12-10 ENCOUNTER — Ambulatory Visit (HOSPITAL_COMMUNITY)
Admission: RE | Admit: 2017-12-10 | Discharge: 2017-12-10 | Disposition: A | Discharge status: Home / Self Care | Payer: Medicare HMO | Source: Ambulatory Visit | Attending: Otolaryngology | Admitting: Otolaryngology

## 2017-12-10 DIAGNOSIS — Z833 Family history of diabetes mellitus: Secondary | ICD-10-CM | POA: Diagnosis not present

## 2017-12-10 DIAGNOSIS — Z79899 Other long term (current) drug therapy: Secondary | ICD-10-CM | POA: Insufficient documentation

## 2017-12-10 DIAGNOSIS — Z87891 Personal history of nicotine dependence: Secondary | ICD-10-CM | POA: Diagnosis not present

## 2017-12-10 DIAGNOSIS — Z7982 Long term (current) use of aspirin: Secondary | ICD-10-CM | POA: Diagnosis not present

## 2017-12-10 DIAGNOSIS — I10 Essential (primary) hypertension: Secondary | ICD-10-CM | POA: Insufficient documentation

## 2017-12-10 DIAGNOSIS — E7800 Pure hypercholesterolemia, unspecified: Secondary | ICD-10-CM | POA: Diagnosis not present

## 2017-12-10 DIAGNOSIS — E041 Nontoxic single thyroid nodule: Secondary | ICD-10-CM | POA: Diagnosis not present

## 2017-12-10 DIAGNOSIS — Z888 Allergy status to other drugs, medicaments and biological substances status: Secondary | ICD-10-CM | POA: Insufficient documentation

## 2017-12-10 DIAGNOSIS — Z8249 Family history of ischemic heart disease and other diseases of the circulatory system: Secondary | ICD-10-CM | POA: Insufficient documentation

## 2017-12-10 DIAGNOSIS — R221 Localized swelling, mass and lump, neck: Secondary | ICD-10-CM | POA: Insufficient documentation

## 2017-12-10 LAB — CBC
HCT: 33.8 % — ABNORMAL LOW (ref 36.0–46.0)
HEMOGLOBIN: 10.9 g/dL — AB (ref 12.0–15.0)
MCH: 29.8 pg (ref 26.0–34.0)
MCHC: 32.2 g/dL (ref 30.0–36.0)
MCV: 92.3 fL (ref 78.0–100.0)
Platelets: 183 10*3/uL (ref 150–400)
RBC: 3.66 MIL/uL — ABNORMAL LOW (ref 3.87–5.11)
RDW: 12.6 % (ref 11.5–15.5)
WBC: 6.6 10*3/uL (ref 4.0–10.5)

## 2017-12-10 LAB — PROTIME-INR
INR: 1.05
Prothrombin Time: 13.6 seconds (ref 11.4–15.2)

## 2017-12-10 MED ORDER — LIDOCAINE HCL (PF) 1 % IJ SOLN
INTRAMUSCULAR | Status: AC
  Filled 2017-12-10: qty 5

## 2017-12-10 MED ORDER — SODIUM CHLORIDE 0.9 % IV SOLN: INTRAVENOUS | Status: DC

## 2017-12-10 NOTE — Consult Note (Signed)
Chief Complaint: Patient was seen in consultation today for US guided biopsy of right superior neck mass  Referring Physician(s): Bates,Dwight  Supervising Physician: Sandi Mariscal  Patient Status: [facility] - Out-pt  History of Present Illness: Sara Fischer is a 77 y.o. female with palpable right neck mass and follow up imaging which revealed lesion superior to right lobe of thyroid?exophytic nodule measuring about 2 cm. Pt has no hx cancer or prior biopsies. She presents today for US guided biopsy of the neck mass for further evaluation.   Past Medical History:  Diagnosis Date  . High cholesterol   . Hypertension     Past Surgical History:  Procedure Laterality Date  . APPENDECTOMY    . COLONOSCOPY  2010   Dr. Gala Romney: pancolonic diverticulosis, sigmoid colon tubular adenoma removed.   . COLONOSCOPY N/A 02/29/2016   Procedure: COLONOSCOPY;  Surgeon: Daneil Dolin, MD;  Location: [facility] ENDO SUITE;  Service: Endoscopy;  Laterality: N/A;  230  . INCISIONAL HERNIA REPAIR N/A 12/28/2016   Procedure: HERNIA REPAIR INCISIONAL;  Surgeon: Aviva Signs, MD;  Location: [facility] ORS;  Service: General;  Laterality: N/A;  . KNEE ARTHROSCOPY     bilateral  . LAPAROSCOPIC BILATERAL SALPINGO OOPHERECTOMY  2001  . PARTIAL HYSTERECTOMY  1979    Allergies: Feldene [piroxicam] and Lodine [etodolac]  Medications: Prior to Admission medications   Medication Sig Start Date End Date Taking? Authorizing Provider  aspirin EC 81 MG tablet Take 81 mg by mouth daily.   Yes [provider]  calcium carbonate (OS-CAL - DOSED IN MG OF ELEMENTAL CALCIUM) 1250 (500 Ca) MG tablet Take 1 tablet by mouth daily with breakfast.   Yes [provider]  Cholecalciferol (D3-1000 PO) Take 2,000 capsules by mouth daily.    Yes [provider]  fluticasone (FLONASE) 50 MCG/ACT nasal spray Place 1 spray into both nostrils daily as needed for allergies or rhinitis.   Yes [provider]    furosemide (LASIX) 40 MG tablet Take 40 mg by mouth daily as needed for fluid or edema.    Yes [provider]  ketoconazole (NIZORAL) 2 % cream Apply 1 application topically daily as needed for rash. 09/04/17  Yes [provider]  metoprolol succinate (TOPROL-XL) 100 MG 24 hr tablet Take 100 mg by mouth daily. 09/10/17  Yes [provider]  Oxcarbazepine (TRILEPTAL) 300 MG tablet Take 1 tablet by mouth 2 (two) times daily. 10/02/17  Yes [provider]  Propylene Glycol (SYSTANE BALANCE) 0.6 % SOLN Place 1 drop into both eyes 2 (two) times daily as needed (dry eyes).   Yes [provider]  simvastatin (ZOCOR) 20 MG tablet Take 20 mg by mouth at bedtime.    Yes [provider]  traMADol (ULTRAM) 50 MG tablet Take 1 tablet (50 mg total) by mouth every 6 (six) hours as needed. 11/03/17  Yes Fredia Sorrow, MD     Family History  Problem Relation Age of Onset  . Diabetes Mother   . Heart disease Mother   . Heart disease Father 7  . Colon cancer Neg Hx     Social History   Socioeconomic History  . Marital status: Widowed    Spouse name: Not on file  . Number of children: Not on file  . Years of education: Not on file  . Highest education level: Not on file  Occupational History  . Not on file  Social Needs  . Financial resource strain: Not on  file  . Food insecurity:    Worry: Not on file    Inability: Not on file  . Transportation needs:    Medical: Not on file    Non-medical: Not on file  Tobacco Use  . Smoking status: Former Smoker    Packs/day: 0.50    Years: 5.00    Pack years: 2.50    Types: Cigarettes    Start date: 08/28/1963    Last attempt to quit: 11/02/1968    Years since quitting: 49.1  . Smokeless tobacco: Never Used  Substance and Sexual Activity  . Alcohol use: Yes    Alcohol/week: 0.0 standard drinks    Comment: wine occas.  . Drug use: No  . Sexual activity: Not on file  Lifestyle  . Physical  activity:    Days per week: Not on file    Minutes per session: Not on file  . Stress: Not on file  Relationships  . Social connections:    Talks on phone: Not on file    Gets together: Not on file    Attends religious service: Not on file    Active member of club or organization: Not on file    Attends meetings of clubs or organizations: Not on file    Relationship status: Not on file  Other Topics Concern  . Not on file  Social History Narrative  . Not on file      Review of Systems denies fever,HA,CP,dyspnea, cough, abd/back pain,N/V or bleeding.   Vital Signs: BP (!) 170/68   Pulse 80   Temp 97.8 F (36.6 C) (Oral)   Ht 5\' 2"  (1.575 m)   Wt 158 lb (71.7 kg)   SpO2 97%   BMI 28.90 kg/m   Physical Exam awake/alert; chest - CTA bilat; heart- RRR; abd- soft,+BS,NT; trace LE edema; palpable ST fullness rt upper/lat neck region,NT  Imaging: US Soft Tissue Head & Neck (non-thyroid)  Result Date: 11/20/2017 CLINICAL DATA:  77 year old female with a history of palpable lump. EXAM: ULTRASOUND OF HEAD/NECK SOFT TISSUES TECHNIQUE: Ultrasound examination of the head and neck soft tissues was performed in the area of clinical concern. COMPARISON:  None. FINDINGS: Grayscale and color duplex ultrasound performed in the region of clinical concern. At the region of palpable abnormality there is a soft tissue focus measuring 2.1 cm x 1.0 cm x 2.0 cm with solid internal soft tissue features and associated blood flow. No hyperechoic hilum to suggest a lymph node. No focal fluid. IMPRESSION: Abnormal soft tissue in the region clinical concern measures 2.1 cm. Further evaluation with contrast-enhanced neck CT is recommended, as this may represent malignancy/pathologic adenopathy. Electronically Signed   By: Corrie Mckusick D.O.   On: 11/20/2017 13:46    Labs:  CBC: Recent Labs    12/26/16 0546 12/27/16 0416 12/29/16 0559 11/03/17 1407  WBC 10.7* 5.3 8.9 5.6  HGB 11.9* 10.5* 10.8* 10.5*    HCT 35.7* 32.2* 32.5* 31.9*  PLT 204 147* 188 205    COAGS: No results for input(s): INR, APTT in the last 8760 hours.  BMP: Recent Labs    12/26/16 0546 12/27/16 0416 12/29/16 0559 11/03/17 1407  NA 142 142 145 143  K 3.7 3.6 3.9 3.6  CL 107 110 108 109  CO2 27 27 29 26   GLUCOSE 130* 89 104* 103*  BUN 20 14 13 20   CALCIUM 9.0 8.6* 9.7 9.3  CREATININE 0.58 0.52 0.55 0.58  GFRNONAA >60 >60 >60 >60  GFRAA >60 >  60 >60 >60    LIVER FUNCTION TESTS: Recent Labs    12/25/16 2212 12/26/16 0546  BILITOT 1.1 0.7  AST 42* 31  ALT 35 27  ALKPHOS 117 88  PROT 8.2* 6.8  ALBUMIN 4.7 3.8    TUMOR MARKERS: No results for input(s): AFPTM, CEA, CA199, CHROMGRNA in the last 8760 hours.  Assessment and Plan: 77 y.o. female with palpable right neck mass and follow up imaging which revealed lesion superior to right lobe of thyroid?exophytic nodule measuring about 2 cm. Pt has no hx cancer or prior biopsies. She presents today for US guided biopsy of the neck mass for further evaluation.Risks and benefits discussed with the patient including, but not limited to bleeding, infection, damage to adjacent structures or low yield requiring additional tests.  All of the patient's questions were answered, patient is agreeable to proceed. Consent signed and in chart.     Thank you for this interesting consult.  I greatly enjoyed meeting Sara Fischer and look forward to participating in their care.  A copy of this report was sent to the requesting provider on this date.  Electronically Signed: D. Rowe Robert, PA-C 12/10/2017, 11:29 AM   I spent a total of 20 minutes in face to face in clinical consultation, greater than 50% of which was counseling/coordinating care for image guided biopsy of right neck mass

## 2017-12-10 NOTE — Procedures (Signed)
Pre Procedure Dx: Right neck nodule Post Procedural Dx: Same  Technically successful US guided biopsy of indeterminate nodule within the right side of the neck.  EBL: None  No immediate complications.   Ronny Bacon, MD Pager #: [phone]

## 2017-12-12 ENCOUNTER — Ambulatory Visit (HOSPITAL_COMMUNITY)
Admission: RE | Admit: 2017-12-12 | Discharge: 2017-12-12 | Disposition: A | Discharge status: Home / Self Care | Payer: Medicare HMO | Source: Ambulatory Visit | Attending: Physician Assistant | Admitting: Physician Assistant

## 2017-12-12 ENCOUNTER — Other Ambulatory Visit (HOSPITAL_COMMUNITY): Payer: Self-pay | Admitting: Physician Assistant

## 2017-12-12 DIAGNOSIS — M899 Disorder of bone, unspecified: Secondary | ICD-10-CM | POA: Insufficient documentation

## 2017-12-12 DIAGNOSIS — E663 Overweight: Secondary | ICD-10-CM | POA: Insufficient documentation

## 2017-12-12 DIAGNOSIS — Z6829 Body mass index (BMI) 29.0-29.9, adult: Secondary | ICD-10-CM | POA: Diagnosis not present

## 2017-12-12 DIAGNOSIS — M25512 Pain in left shoulder: Secondary | ICD-10-CM | POA: Diagnosis not present

## 2017-12-12 DIAGNOSIS — M25412 Effusion, left shoulder: Secondary | ICD-10-CM | POA: Diagnosis not present

## 2017-12-26 DIAGNOSIS — E538 Deficiency of other specified B group vitamins: Secondary | ICD-10-CM | POA: Diagnosis not present

## 2018-01-16 ENCOUNTER — Other Ambulatory Visit (HOSPITAL_COMMUNITY): Payer: Self-pay | Admitting: Family Medicine

## 2018-01-16 DIAGNOSIS — Z1231 Encounter for screening mammogram for malignant neoplasm of breast: Secondary | ICD-10-CM

## 2018-01-27 DIAGNOSIS — E538 Deficiency of other specified B group vitamins: Secondary | ICD-10-CM | POA: Diagnosis not present

## 2018-01-30 ENCOUNTER — Ambulatory Visit (HOSPITAL_COMMUNITY)
Admission: RE | Admit: 2018-01-30 | Discharge: 2018-01-30 | Disposition: A | Discharge status: Home / Self Care | Payer: Medicare HMO | Source: Ambulatory Visit | Attending: Family Medicine | Admitting: Family Medicine

## 2018-01-30 ENCOUNTER — Encounter (HOSPITAL_COMMUNITY): Payer: Self-pay

## 2018-01-30 DIAGNOSIS — Z1231 Encounter for screening mammogram for malignant neoplasm of breast: Secondary | ICD-10-CM | POA: Diagnosis not present

## 2018-02-24 DIAGNOSIS — E538 Deficiency of other specified B group vitamins: Secondary | ICD-10-CM | POA: Diagnosis not present

## 2018-03-28 DIAGNOSIS — E538 Deficiency of other specified B group vitamins: Secondary | ICD-10-CM | POA: Diagnosis not present

## 2018-04-23 DIAGNOSIS — Z0001 Encounter for general adult medical examination with abnormal findings: Secondary | ICD-10-CM | POA: Diagnosis not present

## 2018-04-23 DIAGNOSIS — E7849 Other hyperlipidemia: Secondary | ICD-10-CM | POA: Diagnosis not present

## 2018-04-23 DIAGNOSIS — Z0000 Encounter for general adult medical examination without abnormal findings: Secondary | ICD-10-CM | POA: Diagnosis not present

## 2018-04-23 DIAGNOSIS — Z1389 Encounter for screening for other disorder: Secondary | ICD-10-CM | POA: Diagnosis not present

## 2018-04-23 DIAGNOSIS — Z6828 Body mass index (BMI) 28.0-28.9, adult: Secondary | ICD-10-CM | POA: Diagnosis not present

## 2018-04-23 DIAGNOSIS — E663 Overweight: Secondary | ICD-10-CM | POA: Diagnosis not present

## 2018-04-23 DIAGNOSIS — E538 Deficiency of other specified B group vitamins: Secondary | ICD-10-CM | POA: Diagnosis not present

## 2018-05-27 DIAGNOSIS — E538 Deficiency of other specified B group vitamins: Secondary | ICD-10-CM | POA: Diagnosis not present

## 2018-06-26 DIAGNOSIS — E538 Deficiency of other specified B group vitamins: Secondary | ICD-10-CM | POA: Diagnosis not present

## 2018-07-21 DIAGNOSIS — H6123 Impacted cerumen, bilateral: Secondary | ICD-10-CM | POA: Diagnosis not present

## 2018-07-21 DIAGNOSIS — H9311 Tinnitus, right ear: Secondary | ICD-10-CM | POA: Diagnosis not present

## 2018-08-06 ENCOUNTER — Encounter: Payer: Self-pay | Admitting: *Deleted

## 2018-08-06 ENCOUNTER — Telehealth: Payer: Self-pay | Admitting: *Deleted

## 2018-08-06 NOTE — Telephone Encounter (Signed)
Called pt. Updated med list, pharmacy, allergies, history for visit tomorrow. Explained new check in process. She verbalized understanding.

## 2018-08-07 ENCOUNTER — Other Ambulatory Visit: Payer: Self-pay

## 2018-08-07 ENCOUNTER — Ambulatory Visit (INDEPENDENT_AMBULATORY_CARE_PROVIDER_SITE_OTHER): Payer: Medicare HMO | Admitting: Neurology

## 2018-08-07 ENCOUNTER — Encounter: Payer: Self-pay | Admitting: Neurology

## 2018-08-07 VITALS — BP 155/80 | HR 86 | Temp 97.1°F | Ht 62.0 in | Wt 147.5 lb

## 2018-08-07 DIAGNOSIS — Z972 Presence of dental prosthetic device (complete) (partial): Secondary | ICD-10-CM | POA: Diagnosis not present

## 2018-08-07 DIAGNOSIS — R51 Headache: Secondary | ICD-10-CM

## 2018-08-07 DIAGNOSIS — G500 Trigeminal neuralgia: Secondary | ICD-10-CM | POA: Diagnosis not present

## 2018-08-07 DIAGNOSIS — R519 Headache, unspecified: Secondary | ICD-10-CM | POA: Insufficient documentation

## 2018-08-07 MED ORDER — OXCARBAZEPINE 300 MG PO TABS: ORAL_TABLET | ORAL | 5 refills | Status: DC

## 2018-08-07 NOTE — Progress Notes (Signed)
[facility]  PATIENT: Sara Fischer DOB: 10/01/1940  REFERRING DOCTOR OR PCP: Dr. Franki Cabot, DMD; PCP is [facility] [facility] SOURCE: Patient, notes from Dr. Luvenia Heller  _________________________________   HISTORICAL  CHIEF COMPLAINT:  Chief Complaint  Patient presents with  . New Patient (Initial Visit)    RM 13, alone. Paper referral from Dr. Luvenia Heller for trigeminal neuralgia.     HISTORY OF PRESENT ILLNESS:  I had the pleasure of seeing your patient, Sara Fischer, at [facility] for a neuro consultation regarding right facial pain.  She is a 78 year old woman who began to note pain in the right posterior jaw last July while chewing.    She had severe pain that seemed centered in the right jaw.  Pain comes on as quick 1 second episodes that can be intense and sometimes comes in trains.   Pain was near  constant while chewing a few times.  Occasionally, yawning or talking also triggers the pain.  In between episodes of pain she will sometimes have less intense dull pain.  She has been eating soft foods the past 10 months due to the pain and has lost 5-10 pounds.    Pain is better when she is just sitting still and she is often pain free.   She has tried Oragel with some benefit.  She also takes Oxcarbazepine since August 2018.   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  A newer set of dentures was made in February and there was no change in her symptoms.  She feels there is a bone sticking out in the right posterior jaw.   However, according to Dr. Sueanne Margarita note from 07/29/2018, Sara Fischer's exam showed healthy gum tissue without abrasion or soft tissue injury.  A cone beam CT imaging of the area did not show any remaining tooth roots or bony abnormalities at the sinuses appear normal.  She denies facial weakness or numbness (unless she uses Oragel).   No pain in  the shoulders or neck.   No change in limbs.   No visual changes.     She is in good health with mild essential hypertension and mildly elevated cholesterol.     REVIEW OF SYSTEMS: Constitutional: No fevers, chills, sweats, or change in appetite Eyes: No visual changes, double vision, eye pain Ear, nose and throat: No hearing loss, ear pain, nasal congestion, sore throat Cardiovascular: No chest pain, palpitations Respiratory: No shortness of breath at rest or with exertion.   No wheezes GastrointestinaI: No nausea, vomiting, diarrhea, abdominal pain, fecal incontinence Genitourinary: No dysuria, urinary retention or frequency.  No nocturia. Musculoskeletal: No neck pain, back pain Integumentary: No rash, pruritus, skin lesions Neurological: as above Psychiatric: No depression at this time.  No anxiety Endocrine: No palpitations, diaphoresis, change in appetite, change in weigh or increased thirst Hematologic/Lymphatic: No anemia, purpura, petechiae. Allergic/Immunologic: No itchy/runny eyes, nasal congestion, recent allergic reactions, rashes  ALLERGIES: Allergies  Allergen Reactions  . Feldene [Piroxicam]     Headaches   . Lodine [Etodolac]     headaches    HOME MEDICATIONS:  Current Outpatient Medications:  .  aspirin EC 81 MG tablet, Take 81 mg by mouth daily., Disp: , Rfl:  .  calcium carbonate (OS-CAL - DOSED IN MG OF ELEMENTAL CALCIUM) 1250 (500 Ca) MG tablet, Take 1 tablet by mouth every other day. ,  Disp: , Rfl:  .  Cholecalciferol (D3-1000 PO), Take 2,000 capsules by mouth daily. , Disp: , Rfl:  .  fluticasone (FLONASE) 50 MCG/ACT nasal spray, Place 1 spray into both nostrils daily as needed for allergies or rhinitis., Disp: , Rfl:  .  furosemide (LASIX) 40 MG tablet, Take 40 mg by mouth daily as needed for fluid or edema. , Disp: , Rfl:  .  ketoconazole (NIZORAL) 2 % cream, Apply 1 application topically daily as needed for rash., Disp: , Rfl:  .  metoprolol  succinate (TOPROL-XL) 100 MG 24 hr tablet, Take 100 mg by mouth daily., Disp: , Rfl:  .  Oxcarbazepine (TRILEPTAL) 300 MG tablet, Take 1 tablet by mouth 2 (two) times daily., Disp: , Rfl: 2 .  Propylene Glycol (SYSTANE BALANCE) 0.6 % SOLN, Place 1 drop into both eyes 2 (two) times daily as needed (dry eyes)., Disp: , Rfl:  .  simvastatin (ZOCOR) 20 MG tablet, Take 20 mg by mouth at bedtime. , Disp: , Rfl:   PAST MEDICAL HISTORY: Past Medical History:  Diagnosis Date  . High cholesterol   . Hypertension     PAST SURGICAL HISTORY: Past Surgical History:  Procedure Laterality Date  . APPENDECTOMY    . COLONOSCOPY  2010   Dr. Gala Romney: pancolonic diverticulosis, sigmoid colon tubular adenoma removed.   . COLONOSCOPY N/A 02/29/2016   Procedure: COLONOSCOPY;  Surgeon: Daneil Dolin, MD;  [facility];  Service: Endoscopy;  Laterality: N/A;  230  . INCISIONAL HERNIA REPAIR N/A 12/28/2016   Procedure: HERNIA REPAIR INCISIONAL;  Surgeon: Aviva Signs, MD;  Location: [facility];  Service: General;  Laterality: N/A;  . KNEE ARTHROSCOPY     bilateral  . LAPAROSCOPIC BILATERAL SALPINGO OOPHERECTOMY  2001  . PARTIAL HYSTERECTOMY  1979    FAMILY HISTORY: Family History  Problem Relation Age of Onset  . Diabetes Mother   . Heart disease Mother   . Heart disease Father 36  . Colon cancer Neg Hx     SOCIAL HISTORY:  Social History   Socioeconomic History  . Marital status: Widowed    Spouse name: Not on file  . Number of children: 2  . Years of education: Not on file  . Highest education level: Not on file  Occupational History  . Not on file  Social Needs  . Financial resource strain: Not on file  . Food insecurity:    Worry: Not on file    Inability: Not on file  . Transportation needs:    Medical: Not on file    Non-medical: Not on file  Tobacco Use  . Smoking status: Former Smoker    Packs/day: 0.50    Years: 5.00    Pack years: 2.50    Types: Cigarettes     Start date: 08/28/1963    Last attempt to quit: 11/02/1968    Years since quitting: 49.7  . Smokeless tobacco: Never Used  Substance and Sexual Activity  . Alcohol use: Yes    Alcohol/week: 0.0 standard drinks    Comment: wine occas.  . Drug use: No  . Sexual activity: Not on file  Lifestyle  . Physical activity:    Days per week: Not on file    Minutes per session: Not on file  . Stress: Not on file  Relationships  . Social connections:    Talks on phone: Not on file    Gets together: Not on file    Attends religious service:  Not on file    Active member of club or organization: Not on file    Attends meetings of clubs or organizations: Not on file    Relationship status: Not on file  . Intimate partner violence:    Fear of current or ex partner: Not on file    Emotionally abused: Not on file    Physically abused: Not on file    Forced sexual activity: Not on file  Other Topics Concern  . Not on file  Social History Narrative   Right handed    Caffeine use: 1 cup coffee every morning   1 soda per day     PHYSICAL EXAM  Vitals:   08/07/18 1024  BP: (!) 155/80  Pulse: 86  Temp: (!) 97.1 F (36.2 C)  Weight: 147 lb 8 oz (66.9 kg)  Height: 5\' 2"  (1.575 m)    Body mass index is 26.98 kg/m.   General: The patient is well-developed and well-nourished and in no acute distress  HEENT: The head is normocephalic and atraumatic.  The pharynx is nonerythematous.  She is adentulous.  Sclera are anicteric.  Neck: The neck is supple, no carotid bruits are noted.     Cardiovascular: The heart has a regular rate and rhythm with a normal S1 and S2. There were no murmurs, gallops or rubs.    Skin: Extremities are without rash or edema.  Neurologic Exam  Mental status: The patient is alert and oriented x 3 at the time of the examination. The patient has apparent normal recent and remote memory, with an apparently normal attention span and concentration ability.   Speech is  normal.  Cranial nerves: Extraocular movements are full. Pupils are equal, round, and reactive to light and accomodation.  Visual fields are full.  Facial symmetry is present. There is good facial sensation to soft touch bilaterally, inside and outside the mouth..Facial strength is normal.  Trapezius and sternocleidomastoid strength is normal. No dysarthria is noted.  The tongue is midline, and the patient has symmetric elevation of the soft palate. No obvious hearing deficits are noted.  Motor:  Muscle bulk is normal.   Tone is normal. Strength is  5 / 5 in all 4 extremities.   Sensory: Sensory testing is intact to touch and vibration  Coordination: Cerebellar testing reveals good finger-nose-finger and heel-to-shin bilaterally.  Gait and station: Station is normal.   Gait is normal. Tandem gait is normal.   Reflexes: Deep tendon reflexes are symmetric and normal bilaterally.        DIAGNOSTIC DATA (LABS, IMAGING, TESTING) - I reviewed patient records, labs, notes, testing and imaging myself where available.  Lab Results  Component Value Date   WBC 6.6 12/10/2017   HGB 10.9 (L) 12/10/2017   HCT 33.8 (L) 12/10/2017   MCV 92.3 12/10/2017   PLT 183 12/10/2017      Component Value Date/Time   NA 143 11/03/2017 1407   K 3.6 11/03/2017 1407   CL 109 11/03/2017 1407   CO2 26 11/03/2017 1407   GLUCOSE 103 (H) 11/03/2017 1407   BUN 20 11/03/2017 1407   CREATININE 0.58 11/03/2017 1407   CALCIUM 9.3 11/03/2017 1407   PROT 6.8 12/26/2016 0546   ALBUMIN 3.8 12/26/2016 0546   AST 31 12/26/2016 0546   ALT 27 12/26/2016 0546   ALKPHOS 88 12/26/2016 0546   BILITOT 0.7 12/26/2016 0546   GFRNONAA >60 11/03/2017 1407   GFRAA >60 11/03/2017 1407  ASSESSMENT AND PLAN  Facial pain - Plan: Sedimentation rate, C-reactive protein, Basic metabolic panel  Right trigeminal neuralgia  Wears dentures  In summary, Sara Fischer is a 78 year old woman who has had right facial pain for  the last 10 years.  She has quick spasms of pain in the V2 distribution most consistent with trigeminal neuralgia.  The pain is not classic as she does have told her pain in between the episodes.  She reports that pain improved a little bit with oxcarbazepine.  I will increase the dose further.  If that does not help enough, consider adding lamotrigine or a tricyclic or baclofen.   We will also check some blood work to assess for temporal arteritis.    She should call us back if not better within a few weeks.  She will return in about 3 to 4 months or sooner if there are new or worsening neurologic symptoms.  Thank you for asking me to see Sara Fischer.  Please let me know if I can be of further assistance with her or other patients in the future.  Richard A. Felecia Shelling, MD, [facility] 0/78/6754, 49:20 AM Certified in Neurology, Clinical Neurophysiology, Sleep Medicine, Pain Medicine and Neuroimaging  [facility] [address] [place], [place] [address] [phone]

## 2018-08-08 LAB — BASIC METABOLIC PANEL
BUN/Creatinine Ratio: 31 — ABNORMAL HIGH (ref 12–28)
BUN: 21 mg/dL (ref 8–27)
CO2: 25 mmol/L (ref 20–29)
Calcium: 10.4 mg/dL — ABNORMAL HIGH (ref 8.7–10.3)
Chloride: 100 mmol/L (ref 96–106)
Creatinine, Ser: 0.68 mg/dL (ref 0.57–1.00)
GFR calc Af Amer: 96 mL/min/{1.73_m2} (ref >59)
GFR calc non Af Amer: 83 mL/min/{1.73_m2} (ref >59)
Glucose: 79 mg/dL (ref 65–99)
Potassium: 4.1 mmol/L (ref 3.5–5.2)
Sodium: 144 mmol/L (ref 134–144)

## 2018-08-08 LAB — C-REACTIVE PROTEIN: CRP: <1 mg/L (ref 0–10)

## 2018-08-08 LAB — SEDIMENTATION RATE: Sed Rate: 6 mm/hr (ref 0–40)

## 2018-08-11 ENCOUNTER — Telehealth: Payer: Self-pay | Admitting: *Deleted

## 2018-08-11 NOTE — Telephone Encounter (Signed)
Pt called to let me know she spoke with the pharmacy who told her PA needed. Advised we received request earlier and I submitted. We are waiting on response from insurance. She verbalized understanding.

## 2018-08-11 NOTE — Telephone Encounter (Signed)
Submitted PA oxcarbazepine on CMM. [employer] - PA Case ID: [id number]. Waiting on determination.

## 2018-08-11 NOTE — Telephone Encounter (Signed)
Called and spoke with pt. Relayed results per Dr. Felecia Shelling note. She verbalized understanding.   She states increased trileptal dose would cost 300 dollars and pharmacy unsure why. I asked she contact pharmacy back to let them know dose increased. May be that they did not realize this and showing refill too soon. She will call pharmacy and call our office back if any further issues.

## 2018-08-11 NOTE — Telephone Encounter (Signed)
-----   Message from Britt Bottom, MD sent at 08/08/2018 10:48 AM EDT ----- Please let the patient know that the lab work is fine.

## 2018-08-12 NOTE — Telephone Encounter (Signed)
Received fax notification from [facility] that [employer] approved until 03/12/2019. Faxed notice of approval to [place], [place] at [phone]. Received fax confirmation.  Called pt and relayed above info. She verbalized understanding.

## 2018-08-25 DIAGNOSIS — E538 Deficiency of other specified B group vitamins: Secondary | ICD-10-CM | POA: Diagnosis not present

## 2018-09-03 DIAGNOSIS — D485 Neoplasm of uncertain behavior of skin: Secondary | ICD-10-CM | POA: Diagnosis not present

## 2018-09-03 DIAGNOSIS — B078 Other viral warts: Secondary | ICD-10-CM | POA: Diagnosis not present

## 2018-09-03 DIAGNOSIS — L570 Actinic keratosis: Secondary | ICD-10-CM | POA: Diagnosis not present

## 2018-09-03 DIAGNOSIS — C44519 Basal cell carcinoma of skin of other part of trunk: Secondary | ICD-10-CM | POA: Diagnosis not present

## 2018-09-03 DIAGNOSIS — Z85820 Personal history of malignant melanoma of skin: Secondary | ICD-10-CM | POA: Diagnosis not present

## 2018-09-03 DIAGNOSIS — Z85828 Personal history of other malignant neoplasm of skin: Secondary | ICD-10-CM | POA: Diagnosis not present

## 2018-09-08 DIAGNOSIS — H04123 Dry eye syndrome of bilateral lacrimal glands: Secondary | ICD-10-CM | POA: Diagnosis not present

## 2018-09-08 DIAGNOSIS — H10413 Chronic giant papillary conjunctivitis, bilateral: Secondary | ICD-10-CM | POA: Diagnosis not present

## 2018-09-08 DIAGNOSIS — H43813 Vitreous degeneration, bilateral: Secondary | ICD-10-CM | POA: Diagnosis not present

## 2018-09-08 DIAGNOSIS — Z961 Presence of intraocular lens: Secondary | ICD-10-CM | POA: Diagnosis not present

## 2018-09-08 DIAGNOSIS — H26493 Other secondary cataract, bilateral: Secondary | ICD-10-CM | POA: Diagnosis not present

## 2018-09-15 DIAGNOSIS — H26492 Other secondary cataract, left eye: Secondary | ICD-10-CM | POA: Diagnosis not present

## 2018-09-18 DIAGNOSIS — C44519 Basal cell carcinoma of skin of other part of trunk: Secondary | ICD-10-CM | POA: Diagnosis not present

## 2018-09-23 DIAGNOSIS — E538 Deficiency of other specified B group vitamins: Secondary | ICD-10-CM | POA: Diagnosis not present

## 2018-10-07 ENCOUNTER — Ambulatory Visit: Payer: Medicare HMO | Admitting: Family Medicine

## 2018-10-08 ENCOUNTER — Encounter: Payer: Self-pay | Admitting: Family Medicine

## 2018-10-08 ENCOUNTER — Other Ambulatory Visit: Payer: Self-pay

## 2018-10-08 ENCOUNTER — Ambulatory Visit (INDEPENDENT_AMBULATORY_CARE_PROVIDER_SITE_OTHER): Payer: Medicare HMO | Admitting: Family Medicine

## 2018-10-08 VITALS — BP 192/86 | HR 88 | Temp 96.9°F | Ht 62.0 in | Wt 157.2 lb

## 2018-10-08 DIAGNOSIS — R51 Headache: Secondary | ICD-10-CM | POA: Diagnosis not present

## 2018-10-08 DIAGNOSIS — R519 Headache, unspecified: Secondary | ICD-10-CM

## 2018-10-08 NOTE — Progress Notes (Signed)
PATIENT: Sara Fischer DOB: 1940-10-22  REASON FOR VISIT: follow up HISTORY FROM: patient  Chief Complaint  Patient presents with  . Follow-up    2 mon f/u. Alone. Rm 8. No new concerns at this time.      HISTORY OF PRESENT ILLNESS: Today 10/08/18 Sara Fischer is a 78 y.o. female here today for follow up for right sided jaw pain that started about 10 years ago. She was last seen in 07/2018 when oxcarbazepine was increased to 300mg  in the am and 600mg  in the evening. She has started using a Polygrip cushion grip coating in her dentures and feels this has really helped. She is tolerating medication well with mild sleepiness but feels this is manageable if it helps with pain. She reports only 1 episode of facial pain since last visit in May.   She does have a history of elevated BP. She takes metoprolol 100mg  daily. She has an elevated reading of 192/86 on initial evaluation. She reported pain with automatic cuff. 162/90 with recheck manually. She reports white coat syndrome. Home readings are 130-140/70-80's at home. She denies headaches today. No chest pain, shob, blurred vision, numbness or tingling or trouble breathing.    HISTORY: (copied from Dr Garth Bigness note on 08/07/2018)  She is a 78 year old woman who began to note pain in the right posterior jaw last July while chewing.    She had severe pain that seemed centered in the right jaw.  Pain comes on as quick 1 second episodes that can be intense and sometimes comes in trains.   Pain was near  constant while chewing a few times.  Occasionally, yawning or talking also triggers the pain.  In between episodes of pain she will sometimes have less intense dull pain.  She has been eating soft foods the past 10 months due to the pain and has lost 5-10 pounds.    Pain is better when she is just sitting still and she is often pain free.   She has tried Oragel with some benefit.  She also takes Oxcarbazepine since August 2018.   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  A newer set of dentures was made in February and there was no change in her symptoms.  She feels there is a bone sticking out in the right posterior jaw.   However, according to Dr. Sueanne Margarita note from 07/29/2018, [name]'s exam showed healthy gum tissue without abrasion or soft tissue injury.  A cone beam CT imaging of the area did not show any remaining tooth roots or bony abnormalities at the sinuses appear normal.  She denies facial weakness or numbness (unless she uses Oragel).   No pain in the shoulders or neck.   No change in limbs.   No visual changes.     She is in good health with mild essential hypertension and mildly elevated cholesterol.      REVIEW OF SYSTEMS: Out of a complete 14 system review of symptoms, the patient complains only of the following symptoms, headache and all other reviewed systems are negative.  ALLERGIES: Allergies  Allergen Reactions  . Feldene [Piroxicam]     Headaches   . Lodine [Etodolac]     headaches    HOME MEDICATIONS: Outpatient Medications Prior to Visit  Medication Sig Dispense Refill  . aspirin EC 81 MG tablet Take  81 mg by mouth daily.    . calcium carbonate (OS-CAL - DOSED IN MG OF ELEMENTAL CALCIUM) 1250 (500 Ca) MG tablet Take 1 tablet by mouth every other day.     . Cholecalciferol (D3-1000 PO) Take 2,000 capsules by mouth daily.     . fluticasone (FLONASE) 50 MCG/ACT nasal spray Place 1 spray into both nostrils daily as needed for allergies or rhinitis.    . furosemide (LASIX) 40 MG tablet Take 40 mg by mouth daily as needed for fluid or edema.     Marland Kitchen ketoconazole (NIZORAL) 2 % cream Apply 1 application topically daily as needed for rash.    . metoprolol succinate (TOPROL-XL) 100 MG 24 hr tablet Take 100 mg by mouth daily.    . Oxcarbazepine (TRILEPTAL) 300 MG tablet One po qAM and two po qHS 90 tablet 5  .  Propylene Glycol (SYSTANE BALANCE) 0.6 % SOLN Place 1 drop into both eyes 2 (two) times daily as needed (dry eyes).    . simvastatin (ZOCOR) 20 MG tablet Take 20 mg by mouth at bedtime.      No facility-administered medications prior to visit.     PAST MEDICAL HISTORY: Past Medical History:  Diagnosis Date  . High cholesterol   . Hypertension     PAST SURGICAL HISTORY: Past Surgical History:  Procedure Laterality Date  . APPENDECTOMY    . COLONOSCOPY  2010   Dr. Gala Romney: pancolonic diverticulosis, sigmoid colon tubular adenoma removed.   . COLONOSCOPY N/A 02/29/2016   Procedure: COLONOSCOPY;  Surgeon: Daneil Dolin, MD;  [facility];  Service: Endoscopy;  Laterality: N/A;  230  . INCISIONAL HERNIA REPAIR N/A 12/28/2016   Procedure: HERNIA REPAIR INCISIONAL;  Surgeon: Aviva Signs, MD;  Location: [facility];  Service: General;  Laterality: N/A;  . KNEE ARTHROSCOPY     bilateral  . LAPAROSCOPIC BILATERAL SALPINGO OOPHERECTOMY  2001  . PARTIAL HYSTERECTOMY  1979    FAMILY HISTORY: Family History  Problem Relation Age of Onset  . Diabetes Mother   . Heart disease Mother   . Heart disease Father 63  . Colon cancer Neg Hx     SOCIAL HISTORY: Social History   Socioeconomic History  . Marital status: Widowed    Spouse name: Not on file  . Number of children: 2  . Years of education: Not on file  . Highest education level: Not on file  Occupational History  . Not on file  Social Needs  . Financial resource strain: Not on file  . Food insecurity    Worry: Not on file    Inability: Not on file  . Transportation needs    Medical: Not on file    Non-medical: Not on file  Tobacco Use  . Smoking status: Former Smoker    Packs/day: 0.50    Years: 5.00    Pack years: 2.50    Types: Cigarettes    Start date: 08/28/1963    Quit date: 11/02/1968    Years since quitting: 49.9  . Smokeless tobacco: Never Used  Substance and Sexual Activity  . Alcohol use: Yes     Alcohol/week: 0.0 standard drinks    Comment: wine occas.  . Drug use: No  . Sexual activity: Not on file  Lifestyle  . Physical activity    Days per week: Not on file    Minutes per session: Not on file  . Stress: Not on file  Relationships  . Social connections  Talks on phone: Not on file    Gets together: Not on file    Attends religious service: Not on file    Active member of club or organization: Not on file    Attends meetings of clubs or organizations: Not on file    Relationship status: Not on file  . Intimate partner violence    Fear of current or ex partner: Not on file    Emotionally abused: Not on file    Physically abused: Not on file    Forced sexual activity: Not on file  Other Topics Concern  . Not on file  Social History Narrative   Right handed    Caffeine use: 1 cup coffee every morning   1 soda per day      PHYSICAL EXAM  Vitals:   10/08/18 0856  BP: (!) 192/86  Pulse: 88  Temp: (!) 96.9 F (36.1 C)  TempSrc: Oral  Weight: 157 lb 3.2 oz (71.3 kg)  Height: 5\' 2"  (1.575 m)   Body mass index is 28.75 kg/m.  Generalized: Well developed, in no acute distress  Cardiology: normal rate and rhythm, no murmur noted Neurological examination  Mentation: Alert oriented to time, place, history taking. Follows all commands speech and language fluent Cranial nerve II-XII: Pupils were equal round reactive to light. Extraocular movements were full, visual field were full on confrontational test. Facial sensation and strength were normal. Uvula tongue midline. Head turning and shoulder shrug  were normal and symmetric. Motor: The motor testing reveals 5 over 5 strength of all 4 extremities. Good symmetric motor tone is noted throughout.  Sensory: Sensory testing is intact to soft touch on all 4 extremities. No evidence of extinction is noted.  Coordination: Cerebellar testing reveals good finger-nose-finger and heel-to-shin bilaterally.  Gait and station:  Gait is normal. Tandem gait is normal. Romberg is negative. No drift is seen.    DIAGNOSTIC DATA (LABS, IMAGING, TESTING) - I reviewed patient records, labs, notes, testing and imaging myself where available.  No flowsheet data found.   Lab Results  Component Value Date   WBC 6.6 12/10/2017   HGB 10.9 (L) 12/10/2017   HCT 33.8 (L) 12/10/2017   MCV 92.3 12/10/2017   PLT 183 12/10/2017      Component Value Date/Time   NA 144 08/07/2018 1106   K 4.1 08/07/2018 1106   CL 100 08/07/2018 1106   CO2 25 08/07/2018 1106   GLUCOSE 79 08/07/2018 1106   GLUCOSE 103 (H) 11/03/2017 1407   BUN 21 08/07/2018 1106   CREATININE 0.68 08/07/2018 1106   CALCIUM 10.4 (H) 08/07/2018 1106   PROT 6.8 12/26/2016 0546   ALBUMIN 3.8 12/26/2016 0546   AST 31 12/26/2016 0546   ALT 27 12/26/2016 0546   ALKPHOS 88 12/26/2016 0546   BILITOT 0.7 12/26/2016 0546   GFRNONAA 83 08/07/2018 1106   GFRAA 96 08/07/2018 1106   No results found for: CHOL, HDL, LDLCALC, LDLDIRECT, TRIG, CHOLHDL No results found for: HGBA1C No results found for: VITAMINB12 No results found for: TSH   ASSESSMENT AND PLAN 78 y.o. year old female  has a past medical history of High cholesterol and Hypertension. here with     ICD-10-CM   1. Facial pain  R51     She is doing well with carbamazepine 300 in the am in 600 in the pm. She will continue this dose. She will also continue Polygrip for dentures. I have advised that she keep an eye on  her BP as it was elevated in the office. She will follow up with PCP regularly if she notices it is elevated at home. Continue low sodium diet and regular exercise with dancing. Follow up with me in 1 year, sooner if needed.    No orders of the defined types were placed in this encounter.    No orders of the defined types were placed in this encounter.     I spent 15 minutes with the patient. 50% of this time was spent counseling and educating patient on plan of care and medications.      Debbora Presto, FNP-C 10/08/2018, 10:11 AM [facility] [address] [place], [place] [address] [phone]

## 2018-10-08 NOTE — Patient Instructions (Addendum)
Continue carbamazepine 300mg  in am and 600mg  in the evening   Check BP regularly at home    Follow up in 1 year, sooner if needed     Trigeminal Neuralgia  Trigeminal neuralgia is a nerve disorder that causes severe pain on one side of the face. The pain may last from a few seconds to several minutes. The pain is usually only on one side of the face. Symptoms may occur for days, weeks, or months and then go away for months or years. The pain may return and be worse than before. What are the causes? This condition is caused by damage or pressure to a nerve in the head that is called the trigeminal nerve. An attack can be triggered by:  Talking.  Chewing.  Putting on makeup.  Washing your face.  Shaving your face.  Brushing your teeth.  Touching your face. What increases the risk? You are more likely to develop this condition if you:  Are 30 years of age or older.  Are female. What are the signs or symptoms? The main symptom of this condition is severe pain in the:  Jaw.  Lips.  Eyes.  Nose.  Scalp.  Forehead.  Face. The pain may be:  Intense.  Stabbing.  Electric.  Shock-like. How is this diagnosed? This condition is diagnosed with a physical exam. A CT scan or an MRI may be done to rule out other conditions that can cause facial pain. How is this treated? This condition may be treated with:  Avoiding the things that trigger your symptoms.  Taking prescription medicines (anticonvulsants).  Having surgery. This may be done in severe cases if other medical treatment does not provide relief.  Having procedures such as ablation, thermal, or radiation therapy. It may take up to one month for treatment to start relieving the pain. Follow these instructions at home: Managing pain  Learn as much as you can about how to manage your pain. Ask your health care provider if a pain specialist would be helpful.  Consider talking with a mental health  care provider (psychologist) about how to cope with the pain.  Consider joining a pain support group. General instructions  Take over-the-counter and prescription medicines only as told by your health care provider.  Avoid the things that trigger your symptoms. It may help to: ? Chew on the unaffected side of your mouth. ? Avoid touching your face. ? Avoid blasts of hot or cold air.  Follow your treatment plan as told by your health care provider. This may include: ? Cognitive or behavioral therapy. ? Gentle, regular exercise. ? Meditation or yoga. ? Aromatherapy.  Keep all follow-up visits as told by your health care provider. You may need to be monitored closely to make sure treatment is working well for you. Where to find more information  Facial [employer] Association: fpa-support.org Contact a health care provider if:  Your medicine is not helping your symptoms.  You have side effects from the medicine used for treatment.  You develop new, unexplained symptoms, such as: ? Double vision. ? Facial weakness. ? Facial numbness. ? Changes in hearing or balance.  You feel depressed. Get help right away if:  Your pain is severe and is not getting better.  You develop suicidal thoughts. If you ever feel like you may hurt yourself or others, or have thoughts about taking your own life, get help right away. You can go to your nearest emergency department or call:  Your local emergency  services (911 in the [place]  A suicide crisis helpline, such as the [place] at 407-885-6835. This is open 24 hours a day. Summary  Trigeminal neuralgia is a nerve disorder that causes severe pain on one side of the face. The pain may last from a few seconds to several minutes.  This condition is caused by damage or pressure to a nerve in the head that is called the trigeminal nerve.  Treatment may include avoiding the things that trigger your symptoms, taking  medicines, or having surgery or procedures. It may take up to one month for treatment to start relieving the pain.  Avoid the things that trigger your symptoms.  Keep all follow-up visits as told by your health care provider. You may need to be monitored closely to make sure treatment is working well for you. This information is not intended to replace advice given to you by your health care provider. Make sure you discuss any questions you have with your health care provider. Document Released: 02/24/2000 Document Revised: 01/13/2018 Document Reviewed: 01/13/2018 Elsevier Patient Education  2020 [employer].

## 2018-10-09 NOTE — Progress Notes (Signed)
I have read the note, and I agree with the clinical assessment and plan.  Whitley Patchen A. Nahla Lukin, MD, PhD, FAAN Certified in Neurology, Clinical Neurophysiology, Sleep Medicine, Pain Medicine and Neuroimaging  [facility] [address], [address] [place], [place] [address] [phone]  

## 2018-10-28 DIAGNOSIS — E538 Deficiency of other specified B group vitamins: Secondary | ICD-10-CM | POA: Diagnosis not present

## 2018-11-25 DIAGNOSIS — Z23 Encounter for immunization: Secondary | ICD-10-CM | POA: Diagnosis not present

## 2018-11-25 DIAGNOSIS — E538 Deficiency of other specified B group vitamins: Secondary | ICD-10-CM | POA: Diagnosis not present

## 2018-12-10 DIAGNOSIS — I10 Essential (primary) hypertension: Secondary | ICD-10-CM | POA: Diagnosis not present

## 2018-12-10 DIAGNOSIS — E782 Mixed hyperlipidemia: Secondary | ICD-10-CM | POA: Diagnosis not present

## 2018-12-15 ENCOUNTER — Ambulatory Visit (HOSPITAL_COMMUNITY)
Admission: RE | Admit: 2018-12-15 | Discharge: 2018-12-15 | Disposition: A | Discharge status: Home / Self Care | Payer: Medicare HMO | Source: Ambulatory Visit | Attending: Internal Medicine | Admitting: Internal Medicine

## 2018-12-15 ENCOUNTER — Other Ambulatory Visit: Payer: Self-pay

## 2018-12-15 ENCOUNTER — Other Ambulatory Visit (HOSPITAL_COMMUNITY): Payer: Self-pay | Admitting: Internal Medicine

## 2018-12-15 DIAGNOSIS — S301XXA Contusion of abdominal wall, initial encounter: Secondary | ICD-10-CM | POA: Diagnosis not present

## 2018-12-15 DIAGNOSIS — E663 Overweight: Secondary | ICD-10-CM | POA: Diagnosis not present

## 2018-12-15 DIAGNOSIS — R0781 Pleurodynia: Secondary | ICD-10-CM | POA: Diagnosis not present

## 2018-12-15 DIAGNOSIS — S299XXA Unspecified injury of thorax, initial encounter: Secondary | ICD-10-CM | POA: Insufficient documentation

## 2018-12-15 DIAGNOSIS — R58 Hemorrhage, not elsewhere classified: Secondary | ICD-10-CM | POA: Diagnosis not present

## 2018-12-15 DIAGNOSIS — S2232XA Fracture of one rib, left side, initial encounter for closed fracture: Secondary | ICD-10-CM | POA: Diagnosis not present

## 2018-12-15 DIAGNOSIS — Z6828 Body mass index (BMI) 28.0-28.9, adult: Secondary | ICD-10-CM | POA: Diagnosis not present

## 2018-12-26 DIAGNOSIS — E538 Deficiency of other specified B group vitamins: Secondary | ICD-10-CM | POA: Diagnosis not present

## 2019-01-01 ENCOUNTER — Other Ambulatory Visit (HOSPITAL_COMMUNITY): Payer: Self-pay | Admitting: Internal Medicine

## 2019-01-01 DIAGNOSIS — Z1231 Encounter for screening mammogram for malignant neoplasm of breast: Secondary | ICD-10-CM

## 2019-01-28 DIAGNOSIS — E538 Deficiency of other specified B group vitamins: Secondary | ICD-10-CM | POA: Diagnosis not present

## 2019-02-12 ENCOUNTER — Ambulatory Visit (HOSPITAL_COMMUNITY)
Admission: RE | Admit: 2019-02-12 | Discharge: 2019-02-12 | Disposition: A | Discharge status: Home / Self Care | Payer: Medicare HMO | Source: Ambulatory Visit | Attending: Internal Medicine | Admitting: Internal Medicine

## 2019-02-12 ENCOUNTER — Other Ambulatory Visit: Payer: Self-pay

## 2019-02-12 DIAGNOSIS — Z1231 Encounter for screening mammogram for malignant neoplasm of breast: Secondary | ICD-10-CM | POA: Insufficient documentation

## 2019-02-24 DIAGNOSIS — E538 Deficiency of other specified B group vitamins: Secondary | ICD-10-CM | POA: Diagnosis not present

## 2019-03-03 ENCOUNTER — Other Ambulatory Visit: Payer: Self-pay | Admitting: Neurology

## 2019-03-12 DIAGNOSIS — I10 Essential (primary) hypertension: Secondary | ICD-10-CM | POA: Diagnosis not present

## 2019-03-12 DIAGNOSIS — E7849 Other hyperlipidemia: Secondary | ICD-10-CM | POA: Diagnosis not present

## 2019-03-23 DIAGNOSIS — Z85828 Personal history of other malignant neoplasm of skin: Secondary | ICD-10-CM | POA: Diagnosis not present

## 2019-03-23 DIAGNOSIS — L219 Seborrheic dermatitis, unspecified: Secondary | ICD-10-CM | POA: Diagnosis not present

## 2019-03-23 DIAGNOSIS — L570 Actinic keratosis: Secondary | ICD-10-CM | POA: Diagnosis not present

## 2019-03-30 DIAGNOSIS — E538 Deficiency of other specified B group vitamins: Secondary | ICD-10-CM | POA: Diagnosis not present

## 2019-03-31 DIAGNOSIS — Z23 Encounter for immunization: Secondary | ICD-10-CM | POA: Diagnosis not present

## 2019-03-31 DIAGNOSIS — K430 Incisional hernia with obstruction, without gangrene: Secondary | ICD-10-CM | POA: Diagnosis not present

## 2019-03-31 DIAGNOSIS — Z1389 Encounter for screening for other disorder: Secondary | ICD-10-CM | POA: Diagnosis not present

## 2019-03-31 DIAGNOSIS — L299 Pruritus, unspecified: Secondary | ICD-10-CM | POA: Diagnosis not present

## 2019-03-31 DIAGNOSIS — Z6828 Body mass index (BMI) 28.0-28.9, adult: Secondary | ICD-10-CM | POA: Diagnosis not present

## 2019-03-31 DIAGNOSIS — R0781 Pleurodynia: Secondary | ICD-10-CM | POA: Diagnosis not present

## 2019-04-02 ENCOUNTER — Other Ambulatory Visit: Payer: Self-pay

## 2019-04-02 ENCOUNTER — Emergency Department (HOSPITAL_COMMUNITY): Payer: Medicare HMO

## 2019-04-02 ENCOUNTER — Encounter (HOSPITAL_COMMUNITY): Payer: Self-pay | Admitting: Emergency Medicine

## 2019-04-02 ENCOUNTER — Emergency Department (HOSPITAL_COMMUNITY)
Admission: EM | Admit: 2019-04-02 | Discharge: 2019-04-03 | Disposition: A | Discharge status: Home / Self Care | Payer: Medicare HMO | Attending: Emergency Medicine | Admitting: Emergency Medicine

## 2019-04-02 DIAGNOSIS — Z7982 Long term (current) use of aspirin: Secondary | ICD-10-CM | POA: Diagnosis not present

## 2019-04-02 DIAGNOSIS — Z87891 Personal history of nicotine dependence: Secondary | ICD-10-CM | POA: Diagnosis not present

## 2019-04-02 DIAGNOSIS — R1084 Generalized abdominal pain: Secondary | ICD-10-CM | POA: Diagnosis not present

## 2019-04-02 DIAGNOSIS — Z79899 Other long term (current) drug therapy: Secondary | ICD-10-CM | POA: Insufficient documentation

## 2019-04-02 DIAGNOSIS — I10 Essential (primary) hypertension: Secondary | ICD-10-CM | POA: Insufficient documentation

## 2019-04-02 DIAGNOSIS — R109 Unspecified abdominal pain: Secondary | ICD-10-CM | POA: Diagnosis not present

## 2019-04-02 DIAGNOSIS — Z03818 Encounter for observation for suspected exposure to other biological agents ruled out: Secondary | ICD-10-CM | POA: Diagnosis not present

## 2019-04-02 DIAGNOSIS — Z20822 Contact with and (suspected) exposure to covid-19: Secondary | ICD-10-CM | POA: Insufficient documentation

## 2019-04-02 LAB — URINALYSIS, ROUTINE W REFLEX MICROSCOPIC
Bilirubin Urine: NEGATIVE
Glucose, UA: NEGATIVE mg/dL
Hgb urine dipstick: NEGATIVE
Ketones, ur: NEGATIVE mg/dL
Nitrite: POSITIVE — AB
Protein, ur: NEGATIVE mg/dL
Specific Gravity, Urine: 1.017 (ref 1.005–1.030)
pH: 5 (ref 5.0–8.0)

## 2019-04-02 LAB — COMPREHENSIVE METABOLIC PANEL
ALT: 13 U/L (ref 0–44)
AST: 21 U/L (ref 15–41)
Albumin: 4.3 g/dL (ref 3.5–5.0)
Alkaline Phosphatase: 79 U/L (ref 38–126)
Anion gap: 13 (ref 5–15)
BUN: 17 mg/dL (ref 8–23)
CO2: 30 mmol/L (ref 22–32)
Calcium: 9.8 mg/dL (ref 8.9–10.3)
Chloride: 97 mmol/L — ABNORMAL LOW (ref 98–111)
Creatinine, Ser: 0.89 mg/dL (ref 0.44–1.00)
GFR calc Af Amer: >60 mL/min (ref >60)
GFR calc non Af Amer: >60 mL/min (ref >60)
Glucose, Bld: 96 mg/dL (ref 70–99)
Potassium: 3.4 mmol/L — ABNORMAL LOW (ref 3.5–5.1)
Sodium: 140 mmol/L (ref 135–145)
Total Bilirubin: 0.4 mg/dL (ref 0.3–1.2)
Total Protein: 7.5 g/dL (ref 6.5–8.1)

## 2019-04-02 LAB — CBC
HCT: 32.1 % — ABNORMAL LOW (ref 36.0–46.0)
Hemoglobin: 10.2 g/dL — ABNORMAL LOW (ref 12.0–15.0)
MCH: 30.2 pg (ref 26.0–34.0)
MCHC: 31.8 g/dL (ref 30.0–36.0)
MCV: 95 fL (ref 80.0–100.0)
Platelets: 285 10*3/uL (ref 150–400)
RBC: 3.38 MIL/uL — ABNORMAL LOW (ref 3.87–5.11)
RDW: 12.9 % (ref 11.5–15.5)
WBC: 7.8 10*3/uL (ref 4.0–10.5)
nRBC: 0 % (ref 0.0–0.2)

## 2019-04-02 LAB — RESPIRATORY PANEL BY RT PCR (FLU A&B, COVID)
Influenza A by PCR: NEGATIVE
Influenza B by PCR: NEGATIVE
SARS Coronavirus 2 by RT PCR: NEGATIVE

## 2019-04-02 LAB — LIPASE, BLOOD: Lipase: 32 U/L (ref 11–51)

## 2019-04-02 MED ORDER — IOHEXOL 300 MG/ML  SOLN
75.0000 mL | Freq: Once | INTRAMUSCULAR | Status: AC | PRN
  Administered 2019-04-02: 75 mL via INTRAVENOUS

## 2019-04-02 MED ORDER — SODIUM CHLORIDE 0.9 % IV BOLUS
500.0000 mL | Freq: Once | INTRAVENOUS | Status: AC
  Administered 2019-04-02: 500 mL via INTRAVENOUS

## 2019-04-02 MED ORDER — SODIUM CHLORIDE 0.9 % IV SOLN: INTRAVENOUS | Status: DC

## 2019-04-02 MED ORDER — SODIUM CHLORIDE 0.9% FLUSH
3.0000 mL | Freq: Once | INTRAVENOUS | Status: AC
  Administered 2019-04-02: 3 mL via INTRAVENOUS

## 2019-04-02 NOTE — Discharge Instructions (Addendum)
The evaluation testing did not show any serious problems.  Your abdominal discomfort may improve by using Colace, 100 mg, twice a day.  Also increase the fiber in your diet to help improve stooling.  See the attached instructions.

## 2019-04-02 NOTE — ED Provider Notes (Signed)
[facility] EMERGENCY DEPARTMENT Provider Note   CSN: [id number] Arrival date & time: 04/02/19  2007     History Chief Complaint  Patient presents with  . Abdominal Pain    Sara Fischer is a 79 y.o. female.  HPI She presents for evaluation of abdominal pain for several days.  She has been able to eat but has mild persistent nausea.  There has been no vomiting.  She is having normal bowel movements.  She has had prior abdominal surgeries including hysterectomy, appendectomy, and scar revision.  She denies fever, chills, cough, shortness of breath, weakness or dizziness.  There are no other known modifying factors.    Past Medical History:  Diagnosis Date  . High cholesterol   . Hypertension     Patient Active Problem List   Diagnosis Date Noted  . Facial pain 08/07/2018  . 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 SBO (small bowel obstruction) ([place]) 12/26/2016  . Hypertension 12/26/2016  . Abdominal wall hernia   . Heme positive stool 02/08/2016    Past Surgical History:  Procedure Laterality Date  . APPENDECTOMY    . COLONOSCOPY  2010   Dr. Gala Romney: pancolonic diverticulosis, sigmoid colon tubular adenoma removed.   . COLONOSCOPY N/A 02/29/2016   Procedure: COLONOSCOPY;  Surgeon: Daneil Dolin, MD;  Location: [facility] ENDO SUITE;  Service: Endoscopy;  Laterality: N/A;  230  . INCISIONAL HERNIA REPAIR N/A 12/28/2016   Procedure: HERNIA REPAIR INCISIONAL;  Surgeon: Aviva Signs, MD;  Location: [facility] ORS;  Service: General;  Laterality: N/A;  . KNEE ARTHROSCOPY     bilateral  . LAPAROSCOPIC BILATERAL SALPINGO OOPHERECTOMY  2001  . PARTIAL HYSTERECTOMY  1979     OB History   No obstetric history on file.     Family History  Problem Relation Age of Onset  . Diabetes Mother   . Heart disease Mother   . Heart disease Father 29  . Colon cancer Neg Hx     Social History   Tobacco Use  . Smoking status: Former Smoker    Packs/day: 0.50    Years: 5.00      Pack years: 2.50    Types: Cigarettes    Start date: 08/28/1963    Quit date: 11/02/1968    Years since quitting: 50.4  . Smokeless tobacco: Never Used  Substance Use Topics  . Alcohol use: Yes    Alcohol/week: 0.0 standard drinks    Comment: wine occas.  . Drug use: No    Home Medications Prior to Admission medications   Medication Sig Start Date End Date Taking? Authorizing Provider  aspirin EC 81 MG tablet Take 81 mg by mouth daily.   Yes [provider]  calcium carbonate (OS-CAL - DOSED IN MG OF ELEMENTAL CALCIUM) 1250 (500 Ca) MG tablet Take 1 tablet by mouth every other day.    Yes [provider]  Cholecalciferol (D3-1000 PO) Take 2,000 capsules by mouth daily.    Yes [provider]  fluticasone (FLONASE) 50 MCG/ACT nasal spray Place 1 spray into both nostrils daily as needed for allergies or rhinitis.   Yes [provider]  metoprolol succinate (TOPROL-XL) 100 MG 24 hr tablet Take 100 mg by mouth daily. 09/10/17  Yes [provider]  Oxcarbazepine (TRILEPTAL) 300 MG tablet TAKE 1 TABLET BY MOUTH EVERY MORNING AND 2 TABLETS EVERY NIGHT AT BEDTIME 03/03/19  Yes Lomax, Amy, NP  Propylene Glycol (SYSTANE BALANCE) 0.6 % SOLN Place  1 drop into both eyes 2 (two) times daily as needed (dry eyes).   Yes [provider]  simvastatin (ZOCOR) 20 MG tablet Take 20 mg by mouth at bedtime.    Yes [provider]    Allergies    Feldene [piroxicam] and Lodine [etodolac]  Review of Systems   Review of Systems  All other systems reviewed and are negative.   Physical Exam Updated Vital Signs BP (!) 176/103   Pulse 69   Temp 98.3 F (36.8 C) (Oral)   Resp 16   Ht 5\' 2"  (1.575 m)   Wt 71.7 kg   SpO2 94%   BMI 28.90 kg/m   Physical Exam Vitals and nursing note reviewed.  Constitutional:      General: She is not in acute distress.    Appearance: She is well-developed. She is not ill-appearing, toxic-appearing or  diaphoretic.  HENT:     Head: Normocephalic and atraumatic.     Right Ear: External ear normal.     Left Ear: External ear normal.  Eyes:     Conjunctiva/sclera: Conjunctivae normal.     Pupils: Pupils are equal, round, and reactive to light.  Neck:     Trachea: Phonation normal.  Cardiovascular:     Rate and Rhythm: Normal rate and regular rhythm.     Heart sounds: Normal heart sounds. No murmur.  Pulmonary:     Effort: Pulmonary effort is normal.     Breath sounds: Normal breath sounds.  Abdominal:     Palpations: Abdomen is soft. There is mass (Midline, above umbilicus, about 7 cm which seems to reduce with pressure, indicating possible internal hernia.).     Tenderness: There is abdominal tenderness (Mild suprapubic.). There is no guarding or rebound.  Musculoskeletal:        General: Normal range of motion.     Cervical back: Normal range of motion and neck supple.  Skin:    General: Skin is warm and dry.  Neurological:     Mental Status: She is alert and oriented to person, place, and time.     Cranial Nerves: No cranial nerve deficit.     Sensory: No sensory deficit.     Motor: No abnormal muscle tone.     Coordination: Coordination normal.  Psychiatric:        Mood and Affect: Mood normal.        Behavior: Behavior normal.        Thought Content: Thought content normal.        Judgment: Judgment normal.     ED Results / Procedures / Treatments   Labs (all labs ordered are listed, but only abnormal results are displayed) Labs Reviewed  COMPREHENSIVE METABOLIC PANEL - Abnormal; Notable for the following components:      Result Value   Potassium 3.4 (*)    Chloride 97 (*)    All other components within normal limits  CBC - Abnormal; Notable for the following components:   RBC 3.38 (*)    Hemoglobin 10.2 (*)    HCT 32.1 (*)    All other components within normal limits  URINALYSIS, ROUTINE W REFLEX MICROSCOPIC - Abnormal; Notable for the following components:    APPearance HAZY (*)    Nitrite POSITIVE (*)    Leukocytes,Ua MODERATE (*)    Bacteria, UA FEW (*)    All other components within normal limits  RESPIRATORY PANEL BY RT PCR (FLU A&B, COVID)  LIPASE, BLOOD  EKG None  Radiology CT Abdomen Pelvis W Contrast  Result Date: 04/02/2019 CLINICAL DATA:  Abdominal pain for 3 days. Bowel obstruction suspected. EXAM: CT ABDOMEN AND PELVIS WITH CONTRAST TECHNIQUE: Multidetector CT imaging of the abdomen and pelvis was performed using the standard protocol following bolus administration of intravenous contrast. CONTRAST:  68mL OMNIPAQUE IOHEXOL 300 MG/ML  SOLN COMPARISON:  CT 12/25/2016 FINDINGS: Lower chest: The lung bases are clear. Moderate hiatal hernia. Hepatobiliary: Few scattered tiny hypodensities in the liver, too small to accurately characterize but likely small cysts. Punctate hepatic granuloma. Gallbladder physiologically distended, no calcified stone. No biliary dilatation. Pancreas: No ductal dilatation or inflammation. Spleen: Normal in size. Vague small hypodensity in the central spleen unchanged from prior and considered benign. Adrenals/Urinary Tract: Normal adrenal glands. No hydronephrosis or perinephric edema. Homogeneous renal enhancement with symmetric excretion on delayed phase imaging. Small cysts in the left kidney, including parapelvic cyst in the upper pole. Urinary bladder is only minimally distended, mild wall thickening likely due to nondistention. Stomach/Bowel: Moderate hiatal hernia. Distal stomach is unremarkable. Small distal duodenal diverticulum. Small proximal jejunal diverticula. No diverticulitis. Fecalization of small bowel contents. No bowel dilatation or obstruction. Prior appendectomy. Previous lower ventral abdominal wall hernia has been repaired, minimal laxity of the abdominal wall at site of prior hernia. Subjacent small bowel loops appear normal. Moderate volume of stool throughout the colon. Diverticulosis of the  distal descending and sigmoid colon. No acute diverticulitis. No colonic wall thickening or inflammation. Vascular/Lymphatic: Aorto bi-iliac atherosclerosis. No aortic aneurysm. Portal vein is patent. No adenopathy. Reproductive: Status post hysterectomy. No adnexal masses. Other: No free air, free fluid, or intra-abdominal fluid collection. Musculoskeletal: Bones are under mineralized. Scattered vertebral body hemangiomas. There are no acute or suspicious osseous abnormalities. IMPRESSION: 1. No bowel obstruction or acute abnormality in the abdomen/pelvis. 2. Post lower ventral abdominal wall hernia repair with mild abdominal wall laxity at the operative site, subjacent bowel appears normal. 3. Moderate stool burden with fecalization of small bowel contents, can be seen with slow transit. 4. Colonic diverticulosis without diverticulitis. 5. Moderate hiatal hernia. Electronically Signed   By: Keith Rake M.D.   On: 04/02/2019 23:21    Procedures Procedures (including critical care time)  Medications Ordered in ED Medications  0.9 %  sodium chloride infusion ( Intravenous New Bag/Given 04/02/19 2228)  sodium chloride flush (NS) 0.9 % injection 3 mL (3 mLs Intravenous Given 04/02/19 2202)  sodium chloride 0.9 % bolus 500 mL (500 mLs Intravenous New Bag/Given 04/02/19 2226)  iohexol (OMNIPAQUE) 300 MG/ML solution 75 mL (75 mLs Intravenous Contrast Given 04/02/19 2254)    ED Course  I have reviewed the triage vital signs and the nursing notes.  Pertinent labs & imaging results that were available during my care of the patient were reviewed by me and considered in my medical decision making (see chart for details).  Clinical Course as of Apr 01 2341  Thu Apr 02, 2019  2336 Normal  Respiratory Panel by RT PCR (Flu A&B, Covid) - Nasopharyngeal Swab [EW]  2336 Normal except potassium low, chloride low  Comprehensive metabolic panel(!) [EW]  99991111 Normal  Lipase, blood [EW]  2337 Abnormal, presence  of nitrite, leukocytes, white cells, bacteria.  Urine culture ordered  Urinalysis, Routine w reflex microscopic(!) [EW]  2337 Normal except hemoglobin low  CBC(!) [EW]  2338 Per radiologist, no acute intra-abdominal abnormalities.  Moderate stool burden is present.  Palpable masses consistent with findings of lax ventral wall.  CT  Abdomen Pelvis W Contrast [EW]    Clinical Course User Index [EW] Daleen Bo, MD   MDM Rules/Calculators/A&P                       Patient Vitals for the past 24 hrs:  BP Temp Temp src Pulse Resp SpO2 Height Weight  04/02/19 2230 (!) 176/103 -- -- 69 -- 94 % -- --  04/02/19 2200 (!) 181/73 -- -- 66 -- 95 % -- --  04/02/19 2156 -- -- -- 85 -- 95 % -- --  04/02/19 2149 (!) 201/97 -- -- 83 -- 96 % -- --  04/02/19 2014 -- -- -- -- -- -- 5\' 2"  (1.575 m) 71.7 kg  04/02/19 2013 (!) 186/84 98.3 F (36.8 C) Oral 98 16 98 % -- --    11:38 PM Reevaluation with update and discussion. After initial assessment and treatment, an updated evaluation reveals patient remains comfortable.  She states that she is ready to go home.  She has no further complaints.  Findings discussed with her and all questions were answered. Daleen Bo   Medical Decision Making: Mild abdominal pain, without serious abnormal findings.  Patient may benefit from a bowel program including stool softeners.  Mild incidental hypertension.  Patient is currently taking metoprolol.  Sara Fischer was evaluated in Emergency Department on 04/06/2019 for the symptoms described in the history of present illness. She was evaluated in the context of the global COVID-19 pandemic, which necessitated consideration that the patient might be at risk for infection with the SARS-CoV-2 virus that causes COVID-19. Institutional protocols and algorithms that pertain to the evaluation of patients at risk for COVID-19 are in a state of rapid change based on information released by regulatory bodies including the CDC and  federal and state organizations. These policies and algorithms were followed during the patient's care in the ED.     CRITICAL CARE- no Performed by: Daleen Bo   Final Clinical Impression(s) / ED Diagnoses Final diagnoses:  Generalized abdominal pain   Nursing Notes Reviewed/ Care Coordinated Applicable Imaging Reviewed Interpretation of Laboratory Data incorporated into ED treatment  The patient appears reasonably screened and/or stabilized for discharge and I doubt any other medical condition or other [facility] requiring further screening, evaluation, or treatment in the ED at this time prior to discharge.  Plan: Home Medications-continue routine medication, and use Colace twice a day for improved on bowel routine; Home Treatments-increase fiber and liquid in diet; return here if the recommended treatment, does not improve the symptoms; Recommended follow up-PCP follow-up as needed.    Rx / DC Orders ED Discharge Orders    None       Daleen Bo, MD 04/06/19 0900

## 2019-04-02 NOTE — ED Notes (Signed)
Pt transported to CT ?

## 2019-04-02 NOTE — ED Triage Notes (Signed)
Patient complains of abdominal pain for the last three days. Denies n/v/d

## 2019-04-27 DIAGNOSIS — E663 Overweight: Secondary | ICD-10-CM | POA: Diagnosis not present

## 2019-04-27 DIAGNOSIS — Z6828 Body mass index (BMI) 28.0-28.9, adult: Secondary | ICD-10-CM | POA: Diagnosis not present

## 2019-04-27 DIAGNOSIS — Z1389 Encounter for screening for other disorder: Secondary | ICD-10-CM | POA: Diagnosis not present

## 2019-04-27 DIAGNOSIS — Z0001 Encounter for general adult medical examination with abnormal findings: Secondary | ICD-10-CM | POA: Diagnosis not present

## 2019-04-27 DIAGNOSIS — E538 Deficiency of other specified B group vitamins: Secondary | ICD-10-CM | POA: Diagnosis not present

## 2019-04-27 DIAGNOSIS — I10 Essential (primary) hypertension: Secondary | ICD-10-CM | POA: Diagnosis not present

## 2019-04-27 DIAGNOSIS — E782 Mixed hyperlipidemia: Secondary | ICD-10-CM | POA: Diagnosis not present

## 2019-04-28 DIAGNOSIS — D649 Anemia, unspecified: Secondary | ICD-10-CM | POA: Diagnosis not present

## 2019-04-28 DIAGNOSIS — E538 Deficiency of other specified B group vitamins: Secondary | ICD-10-CM | POA: Diagnosis not present

## 2019-04-28 DIAGNOSIS — Z1389 Encounter for screening for other disorder: Secondary | ICD-10-CM | POA: Diagnosis not present

## 2019-04-28 DIAGNOSIS — Z0001 Encounter for general adult medical examination with abnormal findings: Secondary | ICD-10-CM | POA: Diagnosis not present

## 2019-04-28 DIAGNOSIS — Z0000 Encounter for general adult medical examination without abnormal findings: Secondary | ICD-10-CM | POA: Diagnosis not present

## 2019-04-28 DIAGNOSIS — E782 Mixed hyperlipidemia: Secondary | ICD-10-CM | POA: Diagnosis not present

## 2019-05-05 ENCOUNTER — Other Ambulatory Visit (HOSPITAL_COMMUNITY): Payer: Self-pay | Admitting: Physician Assistant

## 2019-05-05 DIAGNOSIS — E2839 Other primary ovarian failure: Secondary | ICD-10-CM

## 2019-05-25 ENCOUNTER — Encounter (HOSPITAL_COMMUNITY): Payer: Self-pay

## 2019-05-25 ENCOUNTER — Other Ambulatory Visit: Payer: Self-pay

## 2019-05-26 ENCOUNTER — Inpatient Hospital Stay (HOSPITAL_COMMUNITY): Payer: Medicare HMO

## 2019-05-26 ENCOUNTER — Inpatient Hospital Stay (HOSPITAL_COMMUNITY): Payer: Medicare HMO | Attending: Hematology | Admitting: Hematology

## 2019-05-26 DIAGNOSIS — K5730 Diverticulosis of large intestine without perforation or abscess without bleeding: Secondary | ICD-10-CM | POA: Diagnosis not present

## 2019-05-26 DIAGNOSIS — D509 Iron deficiency anemia, unspecified: Secondary | ICD-10-CM | POA: Diagnosis not present

## 2019-05-26 DIAGNOSIS — I10 Essential (primary) hypertension: Secondary | ICD-10-CM | POA: Insufficient documentation

## 2019-05-26 DIAGNOSIS — E538 Deficiency of other specified B group vitamins: Secondary | ICD-10-CM | POA: Insufficient documentation

## 2019-05-26 DIAGNOSIS — Z79899 Other long term (current) drug therapy: Secondary | ICD-10-CM | POA: Diagnosis not present

## 2019-05-26 DIAGNOSIS — K648 Other hemorrhoids: Secondary | ICD-10-CM | POA: Diagnosis not present

## 2019-05-26 DIAGNOSIS — D649 Anemia, unspecified: Secondary | ICD-10-CM | POA: Insufficient documentation

## 2019-05-26 DIAGNOSIS — T454X5A Adverse effect of iron and its compounds, initial encounter: Secondary | ICD-10-CM | POA: Insufficient documentation

## 2019-05-26 DIAGNOSIS — Z87891 Personal history of nicotine dependence: Secondary | ICD-10-CM | POA: Insufficient documentation

## 2019-05-26 LAB — CBC WITH DIFFERENTIAL/PLATELET
Abs Immature Granulocytes: 0.04 K/uL (ref 0.00–0.07)
Basophils Absolute: 0 K/uL (ref 0.0–0.1)
Basophils Relative: 0 %
Eosinophils Absolute: 0.2 K/uL (ref 0.0–0.5)
Eosinophils Relative: 2 %
HCT: 33.4 % — ABNORMAL LOW (ref 36.0–46.0)
Hemoglobin: 10.2 g/dL — ABNORMAL LOW (ref 12.0–15.0)
Immature Granulocytes: 1 %
Lymphocytes Relative: 33 %
Lymphs Abs: 2.2 K/uL (ref 0.7–4.0)
MCH: 27.9 pg (ref 26.0–34.0)
MCHC: 30.5 g/dL (ref 30.0–36.0)
MCV: 91.5 fL (ref 80.0–100.0)
Monocytes Absolute: 0.8 K/uL (ref 0.1–1.0)
Monocytes Relative: 12 %
Neutro Abs: 3.5 K/uL (ref 1.7–7.7)
Neutrophils Relative %: 52 %
Platelets: 278 K/uL (ref 150–400)
RBC: 3.65 MIL/uL — ABNORMAL LOW (ref 3.87–5.11)
RDW: 13.4 % (ref 11.5–15.5)
WBC: 6.7 K/uL (ref 4.0–10.5)
nRBC: 0 % (ref 0.0–0.2)

## 2019-05-26 LAB — LACTATE DEHYDROGENASE: LDH: 173 U/L (ref 98–192)

## 2019-05-26 LAB — VITAMIN D 25 HYDROXY (VIT D DEFICIENCY, FRACTURES): Vit D, 25-Hydroxy: 57.33 ng/mL (ref 30–100)

## 2019-05-26 NOTE — Patient Instructions (Signed)
[facility] Discharge Instructions  Follow up in 2 months with labs    Thank you for choosing [facility] to provide your oncology and hematology care.  To afford each patient quality time with our provider, please arrive at least 15 minutes before your scheduled appointment time.   If you have a lab appointment with the Cancer Center please come in thru the Main Entrance and check in at the main information desk.  You need to re-schedule your appointment should you arrive 10 or more minutes late.  We strive to give you quality time with our providers, and arriving late affects you and other patients whose appointments are after yours.  Also, if you no show three or more times for appointments you may be dismissed from the clinic at the providers discretion.     Again, thank you for choosing [facility].  Our hope is that these requests will decrease the amount of time that you wait before being seen by our physicians.       _____________________________________________________________  Should you have questions after your visit to  Cancer Center, please contact our office at [phone] between the hours of 8:00 a.m. and 4:30 p.m.  Voicemails left after 4:00 p.m. will not be returned until the following business day.  For prescription refill requests, have your pharmacy contact our office and allow 72 hours.    Due to Covid, you will need to wear a mask upon entering the hospital. If you do not have a mask, a mask will be given to you at the Main Entrance upon arrival. For doctor visits, patients may have 1 support person with them. For treatment visits, patients can not have anyone with them due to social distancing guidelines and our immunocompromised population.      

## 2019-05-26 NOTE — Assessment & Plan Note (Addendum)
1.  Normocytic anemia: -CBC on 04/02/2019 shows hemoglobin 10.2, hematocrit 32 and MCV of 95.  White count and platelet count are normal. -Creatinine was 0.89 with total protein of 7.5. -CBC on 04/28/2019 at Dr. Nolon Rod office showed hemoglobin 9.5 MCV 89.  White count was slightly low at 3.4 and platelets were 229.  Differential was normal.  Ferritin was 15 with normal 123456 and folic acid.  Percent saturation was 9.  Reticulocyte count was 2%. -Colonoscopy on 02/29/2016 showed many small and large mouth diverticula in the entire colon.  Grade 1 internal hemorrhoids. -CT abdomen and pelvis on 04/02/2019 showed no bowel obstruction.  Post lower ventral abdominal hernia repair with mild abdominal wall laxity.  Moderate stool burden.  Chronic diverticulosis. -We will repeat her CBC today.  We will check for SPEP.  We will check stool for occult blood.  We will also check copper and methylmalonic acid. -Patient has tried taking oral iron in the past but could not tolerate it. -We talked about starting her on Feraheme weekly x2.  We discussed side effects in detail including rare chance of anaphylactic reactions. -We will see her back in 2 months with repeat labs including ferritin and iron panel.

## 2019-05-26 NOTE — Progress Notes (Signed)
CONSULT NOTE  Patient Care Team: Redmond School, MD as PCP - General (Internal Medicine) Gala Romney, Cristopher Estimable, MD as Consulting Physician (Gastroenterology)  CHIEF COMPLAINTS/PURPOSE OF CONSULTATION: Anemia  HISTORY OF PRESENTING ILLNESS:  Sara Fischer 79 y.o. female was sent here by her PCP Dr. Redmond School for anemia.  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  She reports she was placed on oral iron tablets after having her children.  She was unable to tolerate these due to nausea and abdominal pain.  She reports she has never received IV iron.  She does report ice pica where she eats cupfuls of ice at a time.  She does receive B12 injections monthly and has been doing so for years.  Patient denies any bright red bleeding per rectum or melena.  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  She also reports she has been diagnosed with skin cancers and had them removed in the past.  She denies any history of blood transfusions.  She denies any over-the-counter NSAID ingestion.  She denies any other antiplatelet agents.  She has not noticed any recent bleeding such as epistasis, hematuria or hematochezia.  She denies any recent chest pain on exertion, shortness of breath on minimal exertion, presyncopal episodes, or palpitations.  She reports her last colonoscopy was roughly 3 years ago which only saw hemorrhoids and small diverticula.  She denies any other diagnosis of cancer.  She reports her mother had cancer in her jawbone.  She denies any other family history of cancer.  Patient reports she lives at home alone and does all of her own activities.    MEDICAL HISTORY:  Past Medical History:  Diagnosis Date  . High cholesterol   . Hypertension     SURGICAL HISTORY: Past Surgical History:  Procedure Laterality Date  . ABDOMINAL HYSTERECTOMY    . APPENDECTOMY    . COLONOSCOPY  2010   Dr. Gala Romney: pancolonic diverticulosis, sigmoid  colon tubular adenoma removed.   . COLONOSCOPY N/A 02/29/2016   Procedure: COLONOSCOPY;  Surgeon: Daneil Dolin, MD;  Location: [facility];  Service: Endoscopy;  Laterality: N/A;  230  . INCISIONAL HERNIA REPAIR N/A 12/28/2016   Procedure: HERNIA REPAIR INCISIONAL;  Surgeon: Aviva Signs, MD;  Location: [facility];  Service: General;  Laterality: N/A;  . KNEE ARTHROSCOPY     bilateral  . LAPAROSCOPIC BILATERAL SALPINGO OOPHERECTOMY  2001  . PARTIAL HYSTERECTOMY  1979    SOCIAL HISTORY: Social History   Socioeconomic History  . Marital status: Widowed    Spouse name: Not on file  . Number of children: 4  . Years of education: Not on file  . Highest education level: Not on file  Occupational History  . Occupation: Retired  Tobacco Use  . Smoking status: Former Smoker    Packs/day: 0.50    Years: 5.00    Pack years: 2.50    Types: Cigarettes    Start date: 08/28/1963    Quit date: 11/02/1968    Years since quitting: 50.5  . Smokeless tobacco: Never Used  Substance and Sexual Activity  . Alcohol use: Yes    Alcohol/week: 0.0 standard drinks    Comment: wine occas.  . Drug use: No  . Sexual activity: Not on file  Other Topics Concern  . Not on file  Social History Narrative   Right handed    Caffeine use: 1  cup coffee every morning   1 soda per day   Social Determinants of Health   Financial Resource Strain:   . Difficulty of Paying Living Expenses:   Food Insecurity:   . Worried About Charity fundraiser in the Last Year:   . Arboriculturist in the Last Year:   Transportation Needs:   . Film/video editor (Medical):   Marland Kitchen Lack of Transportation (Non-Medical):   Physical Activity:   . Days of Exercise per Week:   . Minutes of Exercise per Session:   Stress:   . Feeling of Stress :   Social Connections:   . Frequency of Communication with Friends and Family:   . Frequency of Social Gatherings with Friends and Family:   . Attends Religious Services:   .  Active Member of Clubs or Organizations:   . Attends Archivist Meetings:   Marland Kitchen Marital Status:   Intimate Partner Violence:   . Fear of Current or Ex-Partner:   . Emotionally Abused:   Marland Kitchen Physically Abused:   . Sexually Abused:     FAMILY HISTORY: Family History  Problem Relation Age of Onset  . Diabetes Mother   . Heart disease Mother   . Heart disease Father 38  . Dementia Sister   . Heart disease Brother   . Diabetes Sister   . Heart disease Sister   . Heart disease Sister   . Diabetes Sister   . Arthritis Sister   . Heart disease Sister   . Heart disease Brother   . Healthy Son   . Healthy Son   . Colon cancer Neg Hx     ALLERGIES:  is allergic to feldene [piroxicam] and lodine [etodolac].  MEDICATIONS:  Current Outpatient Medications  Medication Sig Dispense Refill  . aspirin EC 81 MG tablet Take 81 mg by mouth daily.    . calcium carbonate (OS-CAL - DOSED IN MG OF ELEMENTAL CALCIUM) 1250 (500 Ca) MG tablet Take 1 tablet by mouth every other day.     . Cholecalciferol (D3-1000 PO) Take 2,000 capsules by mouth daily.     . fluticasone (FLONASE) 50 MCG/ACT nasal spray Place 1 spray into both nostrils daily as needed for allergies or rhinitis.    . metoprolol succinate (TOPROL-XL) 100 MG 24 hr tablet Take 100 mg by mouth daily.    . Oxcarbazepine (TRILEPTAL) 300 MG tablet TAKE 1 TABLET BY MOUTH EVERY MORNING AND 2 TABLETS EVERY NIGHT AT BEDTIME 90 tablet 5  . Propylene Glycol (SYSTANE BALANCE) 0.6 % SOLN Place 1 drop into both eyes 2 (two) times daily as needed (dry eyes).    . simvastatin (ZOCOR) 20 MG tablet Take 20 mg by mouth at bedtime.      No current facility-administered medications for this visit.    REVIEW OF SYSTEMS:   Constitutional: Denies fevers, chills or abnormal night sweats Respiratory: Denies cough, dyspnea or wheezes Cardiovascular: Denies palpitation, chest discomfort or lower extremity swelling Gastrointestinal:  Denies nausea,  heartburn or change in bowel habits Skin: Denies abnormal skin rashes Lymphatics: Denies new lymphadenopathy or easy bruising Neurological:Denies numbness, tingling or new weaknesses Behavioral/Psych: Mood is stable, no new changes  All other systems were reviewed with the patient and are negative.  PHYSICAL EXAMINATION: ECOG PERFORMANCE STATUS: 0 - Asymptomatic  Vitals:   05/26/19 1425  BP: (!) 146/63  Pulse: 87  Resp: 18  Temp: (!) 96.9 F (36.1 C)  SpO2: 97%   Filed Weights  05/26/19 1425  Weight: 156 lb 14.4 oz (71.2 kg)    GENERAL:alert, no distress and comfortable SKIN: skin color, texture, turgor are normal, no rashes or significant lesions NECK: supple, thyroid normal size, non-tender, without nodularity LYMPH:  no palpable lymphadenopathy in the cervical, axillary or inguinal LUNGS: clear to auscultation and percussion with normal breathing effort HEART: regular rate & rhythm and no murmurs and no lower extremity edema ABDOMEN:abdomen soft, non-tender and normal bowel sounds Musculoskeletal:no cyanosis of digits and no clubbing  PSYCH: alert & oriented x 3 with fluent speech NEURO: no focal motor/sensory deficits  LABORATORY DATA:  I have reviewed the data as listed Recent Results (from the past 2160 hour(s))  Urinalysis, Routine w reflex microscopic     Status: Abnormal   Collection Time: 04/02/19  9:43 PM  Result Value Ref Range   Color, Urine YELLOW YELLOW   APPearance HAZY (A) CLEAR   Specific Gravity, Urine 1.017 1.005 - 1.030   pH 5.0 5.0 - 8.0   Glucose, UA NEGATIVE NEGATIVE mg/dL   Hgb urine dipstick NEGATIVE NEGATIVE   Bilirubin Urine NEGATIVE NEGATIVE   Ketones, ur NEGATIVE NEGATIVE mg/dL   Protein, ur NEGATIVE NEGATIVE mg/dL   Nitrite POSITIVE (A) NEGATIVE   Leukocytes,Ua MODERATE (A) NEGATIVE   RBC / HPF 0-5 0 - 5 RBC/hpf   WBC, UA 11-20 0 - 5 WBC/hpf   Bacteria, UA FEW (A) NONE SEEN   Squamous Epithelial / LPF 0-5 0 - 5   Mucus PRESENT     Hyaline Casts, UA PRESENT     Comment: Performed at [facility], [address] [place], [place] [address]  Lipase, blood     Status: None   Collection Time: 04/02/19  9:53 PM  Result Value Ref Range   Lipase 32 11 - 51 U/L    Comment: Performed at [facility], [address], [place], [place] [address]  Comprehensive metabolic panel     Status: Abnormal   Collection Time: 04/02/19  9:53 PM  Result Value Ref Range   Sodium 140 135 - 145 mmol/L   Potassium 3.4 (L) 3.5 - 5.1 mmol/L   Chloride 97 (L) 98 - 111 mmol/L   CO2 30 22 - 32 mmol/L   Glucose, Bld 96 70 - 99 mg/dL   BUN 17 8 - 23 mg/dL   Creatinine, Ser 0.89 0.44 - 1.00 mg/dL   Calcium 9.8 8.9 - 10.3 mg/dL   Total Protein 7.5 6.5 - 8.1 g/dL   Albumin 4.3 3.5 - 5.0 g/dL   AST 21 15 - 41 U/L   ALT 13 0 - 44 U/L   Alkaline Phosphatase 79 38 - 126 U/L   Total Bilirubin 0.4 0.3 - 1.2 mg/dL   GFR calc non Af Amer >60 >60 mL/min   GFR calc Af Amer >60 >60 mL/min   Anion gap 13 5 - 15    Comment: Performed at [facility], [address], [place], [place] [address]  CBC     Status: Abnormal   Collection Time: 04/02/19  9:53 PM  Result Value Ref Range   WBC 7.8 4.0 - 10.5 K/uL   RBC 3.38 (L) 3.87 - 5.11 MIL/uL   Hemoglobin 10.2 (L) 12.0 - 15.0 g/dL   HCT 32.1 (L) 36.0 - 46.0 %   MCV 95.0 80.0 - 100.0 fL   MCH 30.2 26.0 - 34.0 pg   MCHC 31.8 30.0 - 36.0 g/dL   RDW 12.9 11.5 - 15.5 %  Platelets 285 150 - 400 K/uL   nRBC 0.0 0.0 - 0.2 %    Comment: Performed at [facility], [address], [place], [place] [address]  Respiratory Panel by RT PCR (Flu A&B, Covid) - Nasopharyngeal Swab     Status: None   Collection Time: 04/02/19 10:25 PM   Specimen: Nasopharyngeal Swab  Result Value Ref Range   SARS Coronavirus 2 by RT PCR NEGATIVE NEGATIVE    Comment: (NOTE) SARS-CoV-2 target nucleic acids are NOT DETECTED. The SARS-CoV-2 RNA is generally detectable in upper respiratoy specimens during the acute phase of infection. The  lowest concentration of SARS-CoV-2 viral copies this assay can detect is 131 copies/mL. A negative result does not preclude SARS-Cov-2 infection and should not be used as the sole basis for treatment or other patient management decisions. A negative result may occur with  improper specimen collection/handling, submission of specimen other than nasopharyngeal swab, presence of viral mutation(s) within the areas targeted by this assay, and inadequate number of viral copies (<131 copies/mL). A negative result must be combined with clinical observations, patient history, and epidemiological information. The expected result is Negative. Fact Sheet for Patients:  PinkCheek.be Fact Sheet for Healthcare Providers:  GravelBags.it This test is not yet ap proved or cleared by the [place] FDA and  has been authorized for detection and/or diagnosis of SARS-CoV-2 by FDA under an Emergency Use Authorization (EUA). This EUA will remain  in effect (meaning this test can be used) for the duration of the COVID-19 declaration under Section 564(b)(1) of the Act, 21 U.S.C. section 360bbb-3(b)(1), unless the authorization is terminated or revoked sooner.    Influenza A by PCR NEGATIVE NEGATIVE   Influenza B by PCR NEGATIVE NEGATIVE    Comment: (NOTE) The Xpert Xpress SARS-CoV-2/FLU/RSV assay is intended as an aid in  the diagnosis of influenza from Nasopharyngeal swab specimens and  should not be used as a sole basis for treatment. Nasal washings and  aspirates are unacceptable for Xpert Xpress SARS-CoV-2/FLU/RSV  testing. Fact Sheet for Patients: PinkCheek.be Fact Sheet for Healthcare Providers: GravelBags.it This test is not yet approved or cleared by the [place] FDA and  has been authorized for detection and/or diagnosis of SARS-CoV-2 by  FDA under an Emergency Use  Authorization (EUA). This EUA will remain  in effect (meaning this test can be used) for the duration of the  Covid-19 declaration under Section 564(b)(1) of the Act, 21  U.S.C. section 360bbb-3(b)(1), unless the authorization is  terminated or revoked. Performed at [facility], [address] [place], [place] [address]   CBC with Differential     Status: Abnormal   Collection Time: 05/26/19  4:02 PM  Result Value Ref Range   WBC 6.7 4.0 - 10.5 K/uL   RBC 3.65 (L) 3.87 - 5.11 MIL/uL   Hemoglobin 10.2 (L) 12.0 - 15.0 g/dL   HCT 33.4 (L) 36.0 - 46.0 %   MCV 91.5 80.0 - 100.0 fL   MCH 27.9 26.0 - 34.0 pg   MCHC 30.5 30.0 - 36.0 g/dL   RDW 13.4 11.5 - 15.5 %   Platelets 278 150 - 400 K/uL   nRBC 0.0 0.0 - 0.2 %   Neutrophils Relative % 52 %   Neutro Abs 3.5 1.7 - 7.7 K/uL   Lymphocytes Relative 33 %   Lymphs Abs 2.2 0.7 - 4.0 K/uL   Monocytes Relative 12 %   Monocytes Absolute 0.8 0.1 - 1.0 K/uL   Eosinophils Relative  2 %   Eosinophils Absolute 0.2 0.0 - 0.5 K/uL   Basophils Relative 0 %   Basophils Absolute 0.0 0.0 - 0.1 K/uL   Immature Granulocytes 1 %   Abs Immature Granulocytes 0.04 0.00 - 0.07 K/uL    Comment: Performed at [facility], [address], [place], [place] [address]    RADIOGRAPHIC STUDIES: I have personally reviewed the radiological images as listed and agreed with the findings in the report. I have independently examined and elicited history from this patient.  I agree with the HPI dictated by my nurse practitioner Wenda Low, FNP.  I have independently formulated my assessment and plan.  ASSESSMENT & PLAN:  Normocytic anemia 1.  Normocytic anemia: -CBC on 04/02/2019 shows hemoglobin 10.2, hematocrit 32 and MCV of 95.  White count and platelet count are normal. -Creatinine was 0.89 with total protein of 7.5. -CBC on 04/28/2019 at Dr. Nolon Rod office showed hemoglobin 9.5 MCV 89.  White count was slightly low at 3.4 and platelets were 229.  Differential was  normal.  Ferritin was 15 with normal 123456 and folic acid.  Percent saturation was 9.  Reticulocyte count was 2%. -Colonoscopy on 02/29/2016 showed many small and large mouth diverticula in the entire colon.  Grade 1 internal hemorrhoids. -CT abdomen and pelvis on 04/02/2019 showed no bowel obstruction.  Post lower ventral abdominal hernia repair with mild abdominal wall laxity.  Moderate stool burden.  Chronic diverticulosis. -We will repeat her CBC today.  We will check for SPEP.  We will check stool for occult blood.  We will also check copper and methylmalonic acid. -Patient has tried taking oral iron in the past but could not tolerate it. -We talked about starting her on Feraheme weekly x2.  We discussed side effects in detail including rare chance of anaphylactic reactions. -We will see her back in 2 months with repeat labs including ferritin and iron panel.     All questions were answered. The patient knows to call the clinic with any problems, questions or concerns.      Derek Jack, MD 05/26/19 4:41 PM

## 2019-05-27 DIAGNOSIS — E538 Deficiency of other specified B group vitamins: Secondary | ICD-10-CM | POA: Diagnosis not present

## 2019-05-27 LAB — HAPTOGLOBIN: Haptoglobin: 237 mg/dL (ref 42–346)

## 2019-05-28 LAB — PROTEIN ELECTROPHORESIS, SERUM
A/G Ratio: 1.1 (ref 0.7–1.7)
Albumin ELP: 3.6 g/dL (ref 2.9–4.4)
Alpha-1-Globulin: 0.3 g/dL (ref 0.0–0.4)
Alpha-2-Globulin: 1.1 g/dL — ABNORMAL HIGH (ref 0.4–1.0)
Beta Globulin: 1.1 g/dL (ref 0.7–1.3)
Gamma Globulin: 0.9 g/dL (ref 0.4–1.8)
Globulin, Total: 3.4 g/dL (ref 2.2–3.9)
Total Protein ELP: 7 g/dL (ref 6.0–8.5)

## 2019-05-29 LAB — COPPER, SERUM: Copper: 157 ug/dL (ref 80–158)

## 2019-06-03 ENCOUNTER — Inpatient Hospital Stay (HOSPITAL_COMMUNITY): Payer: Medicare HMO

## 2019-06-03 VITALS — BP 127/70 | HR 75 | Temp 97.2°F | Resp 18

## 2019-06-03 DIAGNOSIS — I10 Essential (primary) hypertension: Secondary | ICD-10-CM | POA: Diagnosis not present

## 2019-06-03 DIAGNOSIS — T454X5A Adverse effect of iron and its compounds, initial encounter: Secondary | ICD-10-CM | POA: Diagnosis not present

## 2019-06-03 DIAGNOSIS — Z87891 Personal history of nicotine dependence: Secondary | ICD-10-CM | POA: Diagnosis not present

## 2019-06-03 DIAGNOSIS — Z79899 Other long term (current) drug therapy: Secondary | ICD-10-CM | POA: Diagnosis not present

## 2019-06-03 DIAGNOSIS — D649 Anemia, unspecified: Secondary | ICD-10-CM

## 2019-06-03 DIAGNOSIS — K5730 Diverticulosis of large intestine without perforation or abscess without bleeding: Secondary | ICD-10-CM | POA: Diagnosis not present

## 2019-06-03 DIAGNOSIS — D509 Iron deficiency anemia, unspecified: Secondary | ICD-10-CM | POA: Diagnosis not present

## 2019-06-03 DIAGNOSIS — K648 Other hemorrhoids: Secondary | ICD-10-CM | POA: Diagnosis not present

## 2019-06-03 DIAGNOSIS — E538 Deficiency of other specified B group vitamins: Secondary | ICD-10-CM | POA: Diagnosis not present

## 2019-06-03 LAB — OCCULT BLOOD X 1 CARD TO LAB, STOOL
Fecal Occult Bld: NEGATIVE
Fecal Occult Bld: NEGATIVE
Fecal Occult Bld: NEGATIVE

## 2019-06-03 MED ORDER — SODIUM CHLORIDE 0.9 % IV SOLN
510.0000 mg | Freq: Once | INTRAVENOUS | Status: AC
  Administered 2019-06-03: 510 mg via INTRAVENOUS
  Filled 2019-06-03: qty 510

## 2019-06-03 MED ORDER — SODIUM CHLORIDE 0.9 % IV SOLN: Freq: Once | INTRAVENOUS | Status: AC

## 2019-06-03 NOTE — Patient Instructions (Signed)
[facility]  Discharge Instructions:   _______________________________________________________________  Thank you for choosing [facility] to provide your oncology and hematology care.  To afford each patient quality time with our providers, please arrive at least 15 minutes before your scheduled appointment.  You need to re-schedule your appointment if you arrive 10 or more minutes late.  We strive to give you quality time with our providers, and arriving late affects you and other patients whose appointments are after yours.  Also, if you no show three or more times for appointments you may be dismissed from the clinic.  Again, thank you for choosing  [facility]. Our hope is that these requests will allow you access to exceptional care and in a timely manner. _______________________________________________________________  If you have questions after your visit, please contact our office at [phone] between the hours of 8:30 a.m. and 5:00 p.m. Voicemails left after 4:30 p.m. will not be returned until the following business day. _______________________________________________________________  For prescription refill requests, have your pharmacy contact our office. _______________________________________________________________  Recommendations made by the consultant and any test results will be sent to your referring physician. _______________________________________________________________ 

## 2019-06-03 NOTE — Progress Notes (Signed)
Iron given per orders. Patient tolerated it well without problems. Vitals stable and discharged home from clinic ambulatory. Follow up as scheduled.  

## 2019-06-10 ENCOUNTER — Inpatient Hospital Stay (HOSPITAL_COMMUNITY): Payer: Medicare HMO

## 2019-06-10 ENCOUNTER — Other Ambulatory Visit: Payer: Self-pay

## 2019-06-10 VITALS — BP 143/49 | HR 65 | Temp 97.7°F | Resp 18

## 2019-06-10 DIAGNOSIS — T454X5A Adverse effect of iron and its compounds, initial encounter: Secondary | ICD-10-CM | POA: Diagnosis not present

## 2019-06-10 DIAGNOSIS — E7849 Other hyperlipidemia: Secondary | ICD-10-CM | POA: Diagnosis not present

## 2019-06-10 DIAGNOSIS — I10 Essential (primary) hypertension: Secondary | ICD-10-CM | POA: Diagnosis not present

## 2019-06-10 DIAGNOSIS — D649 Anemia, unspecified: Secondary | ICD-10-CM

## 2019-06-10 DIAGNOSIS — Z87891 Personal history of nicotine dependence: Secondary | ICD-10-CM | POA: Diagnosis not present

## 2019-06-10 DIAGNOSIS — K648 Other hemorrhoids: Secondary | ICD-10-CM | POA: Diagnosis not present

## 2019-06-10 DIAGNOSIS — E538 Deficiency of other specified B group vitamins: Secondary | ICD-10-CM | POA: Diagnosis not present

## 2019-06-10 DIAGNOSIS — D509 Iron deficiency anemia, unspecified: Secondary | ICD-10-CM | POA: Diagnosis not present

## 2019-06-10 DIAGNOSIS — K5730 Diverticulosis of large intestine without perforation or abscess without bleeding: Secondary | ICD-10-CM | POA: Diagnosis not present

## 2019-06-10 DIAGNOSIS — Z79899 Other long term (current) drug therapy: Secondary | ICD-10-CM | POA: Diagnosis not present

## 2019-06-10 MED ORDER — SODIUM CHLORIDE 0.9 % IV SOLN: Freq: Once | INTRAVENOUS | Status: AC

## 2019-06-10 MED ORDER — SODIUM CHLORIDE 0.9 % IV SOLN
510.0000 mg | Freq: Once | INTRAVENOUS | Status: AC
  Administered 2019-06-10: 510 mg via INTRAVENOUS
  Filled 2019-06-10: qty 510

## 2019-06-10 NOTE — Patient Instructions (Signed)
[facility]  Discharge Instructions:   _______________________________________________________________  Thank you for choosing [facility] to provide your oncology and hematology care.  To afford each patient quality time with our providers, please arrive at least 15 minutes before your scheduled appointment.  You need to re-schedule your appointment if you arrive 10 or more minutes late.  We strive to give you quality time with our providers, and arriving late affects you and other patients whose appointments are after yours.  Also, if you no show three or more times for appointments you may be dismissed from the clinic.  Again, thank you for choosing [facility]. Our hope is that these requests will allow you access to exceptional care and in a timely manner. _______________________________________________________________  If you have questions after your visit, please contact our office at [phone] between the hours of 8:30 a.m. and 5:00 p.m. Voicemails left after 4:30 p.m. will not be returned until the following business day. _______________________________________________________________  For prescription refill requests, have your pharmacy contact our office. _______________________________________________________________  Recommendations made by the consultant and any test results will be sent to your referring physician. _______________________________________________________________ 

## 2019-06-10 NOTE — Progress Notes (Signed)
Feraheme given today per MD orders. Tolerated infusion without adverse affects. Vital signs stable. No complaints at this time. Discharged from clinic ambulatory. F/U with [facility] as scheduled.  

## 2019-06-26 DIAGNOSIS — E538 Deficiency of other specified B group vitamins: Secondary | ICD-10-CM | POA: Diagnosis not present

## 2019-07-27 DIAGNOSIS — E538 Deficiency of other specified B group vitamins: Secondary | ICD-10-CM | POA: Diagnosis not present

## 2019-07-31 ENCOUNTER — Other Ambulatory Visit (HOSPITAL_COMMUNITY): Payer: Medicare HMO

## 2019-07-31 ENCOUNTER — Other Ambulatory Visit (HOSPITAL_COMMUNITY): Payer: Self-pay | Admitting: *Deleted

## 2019-07-31 DIAGNOSIS — D649 Anemia, unspecified: Secondary | ICD-10-CM

## 2019-08-03 ENCOUNTER — Other Ambulatory Visit: Payer: Self-pay

## 2019-08-03 ENCOUNTER — Inpatient Hospital Stay (HOSPITAL_COMMUNITY): Payer: Medicare HMO | Attending: Hematology

## 2019-08-03 DIAGNOSIS — E7800 Pure hypercholesterolemia, unspecified: Secondary | ICD-10-CM | POA: Diagnosis not present

## 2019-08-03 DIAGNOSIS — I10 Essential (primary) hypertension: Secondary | ICD-10-CM | POA: Insufficient documentation

## 2019-08-03 DIAGNOSIS — Z79899 Other long term (current) drug therapy: Secondary | ICD-10-CM | POA: Insufficient documentation

## 2019-08-03 DIAGNOSIS — Z87891 Personal history of nicotine dependence: Secondary | ICD-10-CM | POA: Diagnosis not present

## 2019-08-03 DIAGNOSIS — D649 Anemia, unspecified: Secondary | ICD-10-CM | POA: Diagnosis not present

## 2019-08-03 LAB — VITAMIN D 25 HYDROXY (VIT D DEFICIENCY, FRACTURES): Vit D, 25-Hydroxy: 67.17 ng/mL (ref 30–100)

## 2019-08-03 LAB — LACTATE DEHYDROGENASE: LDH: 164 U/L (ref 98–192)

## 2019-08-04 LAB — PROTEIN ELECTROPHORESIS, SERUM
A/G Ratio: 1.2 (ref 0.7–1.7)
Albumin ELP: 3.6 g/dL (ref 2.9–4.4)
Alpha-1-Globulin: 0.3 g/dL (ref 0.0–0.4)
Alpha-2-Globulin: 1 g/dL (ref 0.4–1.0)
Beta Globulin: 1 g/dL (ref 0.7–1.3)
Gamma Globulin: 0.8 g/dL (ref 0.4–1.8)
Globulin, Total: 3 g/dL (ref 2.2–3.9)
Total Protein ELP: 6.6 g/dL (ref 6.0–8.5)

## 2019-08-07 ENCOUNTER — Other Ambulatory Visit: Payer: Self-pay

## 2019-08-07 ENCOUNTER — Inpatient Hospital Stay (HOSPITAL_COMMUNITY): Payer: Medicare HMO | Admitting: Nurse Practitioner

## 2019-08-07 DIAGNOSIS — D649 Anemia, unspecified: Secondary | ICD-10-CM | POA: Diagnosis not present

## 2019-08-07 DIAGNOSIS — Z79899 Other long term (current) drug therapy: Secondary | ICD-10-CM | POA: Diagnosis not present

## 2019-08-07 DIAGNOSIS — Z87891 Personal history of nicotine dependence: Secondary | ICD-10-CM | POA: Diagnosis not present

## 2019-08-07 DIAGNOSIS — E7800 Pure hypercholesterolemia, unspecified: Secondary | ICD-10-CM | POA: Diagnosis not present

## 2019-08-07 DIAGNOSIS — I10 Essential (primary) hypertension: Secondary | ICD-10-CM | POA: Diagnosis not present

## 2019-08-07 NOTE — Assessment & Plan Note (Addendum)
1. Normocytic anemia: -CBC on 04/02/2019 showed hemoglobin 10.2, hematocrit 32 and MCV of 95. WBC and platelets are normal. -Creatinine was 0.89 with total protein of 7.5. -CBC on 04/28/2019 at Dr. Nolon Rod office showed hemoglobin 9.5 MCV of 89. White count was slightly low at 3.4 and platelets were 229. Differential was normal ferritin was 15 and normal 123456 and folic acid. Percent saturation was 9 oh reticulocyte was 2% -Colonoscopy on 02/29/2016 showed many small and large mouth diverticula in the entire colon. Grade 1 internal hemorrhoids. -CT abdomen pelvis on 04/02/2019 showed no bowel obstruction. Post lower ventral abdominal hernia repair with mild abdominal wall laxity. Moderate stool burden. Chronic diverticulosis. -To complete the work-up we check for SPEP which was negative. LDH was normal. Vitamin D was normal. Occult stool blood was negative. -Patient has tried taking oral iron in the past and could not tolerate it. -She received 2 Feraheme was on 06/03/2019 and 06/10/2019. -She did not have labs done today.  She reports she feels a lot better after her iron infusions.  Her energy levels are better. -We will recheck her back in 2 months with repeat labs.

## 2019-08-07 NOTE — Progress Notes (Signed)
[address], [place] [address]   CLINIC:  Medical Oncology/Hematology  PCP:  Redmond School, [place] [id number] [phone]   REASON FOR VISIT: Follow-up for normocytic anemia   CURRENT THERAPY: Intermittent iron infusions   INTERVAL HISTORY:  Sara Fischer 79 y.o. female returns for routine follow-up for normocytic anemia.  Patient reports she feels improved since her iron infusions.  Her energy levels are better.  She denies any bright red bleeding per rectum or melena.  She denies any easy bruising or bleeding. Denies any nausea, vomiting, or diarrhea. Denies any new pains. Had not noticed any recent bleeding such as epistaxis, hematuria or hematochezia. Denies recent chest pain on exertion, shortness of breath on minimal exertion, pre-syncopal episodes, or palpitations. Denies any numbness or tingling in hands or feet. Denies any recent fevers, infections, or recent hospitalizations. Patient reports appetite at 100% and energy level at 100%.  She is eating well maintain her weight at this time.     REVIEW OF SYSTEMS:  Review of Systems  All other systems reviewed and are negative.    PAST MEDICAL/SURGICAL HISTORY:  Past Medical History:  Diagnosis Date  . High cholesterol   . Hypertension    Past Surgical History:  Procedure Laterality Date  . ABDOMINAL HYSTERECTOMY    . APPENDECTOMY    . COLONOSCOPY  2010   Dr. Gala Romney: pancolonic diverticulosis, sigmoid colon tubular adenoma removed.   . COLONOSCOPY N/A 02/29/2016   Procedure: COLONOSCOPY;  Surgeon: Daneil Dolin, MD;  [facility];  Service: Endoscopy;  Laterality: N/A;  230  . INCISIONAL HERNIA REPAIR N/A 12/28/2016   Procedure: HERNIA REPAIR INCISIONAL;  Surgeon: Aviva Signs, MD;  Location: [facility];  Service: General;  Laterality: N/A;  . KNEE ARTHROSCOPY     bilateral  . LAPAROSCOPIC BILATERAL SALPINGO OOPHERECTOMY  2001  . PARTIAL  HYSTERECTOMY  1979     SOCIAL HISTORY:  Social History   Socioeconomic History  . Marital status: Widowed    Spouse name: Not on file  . Number of children: 4  . Years of education: Not on file  . Highest education level: Not on file  Occupational History  . Occupation: Retired  Tobacco Use  . Smoking status: Former Smoker    Packs/day: 0.50    Years: 5.00    Pack years: 2.50    Types: Cigarettes    Start date: 08/28/1963    Quit date: 11/02/1968    Years since quitting: 50.7  . Smokeless tobacco: Never Used  Substance and Sexual Activity  . Alcohol use: Yes    Alcohol/week: 0.0 standard drinks    Comment: wine occas.  . Drug use: No  . Sexual activity: Not on file  Other Topics Concern  . Not on file  Social History Narrative   Right handed    Caffeine use: 1 cup coffee every morning   1 soda per day   Social Determinants of Health   Financial Resource Strain:   . Difficulty of Paying Living Expenses:   Food Insecurity:   . Worried About Charity fundraiser in the Last Year:   . Arboriculturist in the Last Year:   Transportation Needs:   . Film/video editor (Medical):   Marland Kitchen Lack of Transportation (Non-Medical):   Physical Activity:   . Days of Exercise per Week:   . Minutes of Exercise per Session:   Stress:   .  Feeling of Stress :   Social Connections:   . Frequency of Communication with Friends and Family:   . Frequency of Social Gatherings with Friends and Family:   . Attends Religious Services:   . Active Member of Clubs or Organizations:   . Attends Archivist Meetings:   Marland Kitchen Marital Status:   Intimate Partner Violence:   . Fear of Current or Ex-Partner:   . Emotionally Abused:   Marland Kitchen Physically Abused:   . Sexually Abused:     FAMILY HISTORY:  Family History  Problem Relation Age of Onset  . Diabetes Mother   . Heart disease Mother   . Heart disease Father 56  . Dementia Sister   . Heart disease Brother   . Diabetes Sister   .  Heart disease Sister   . Heart disease Sister   . Diabetes Sister   . Arthritis Sister   . Heart disease Sister   . Heart disease Brother   . Healthy Son   . Healthy Son   . Colon cancer Neg Hx     CURRENT MEDICATIONS:  Outpatient Encounter Medications as of 08/07/2019  Medication Sig  . aspirin EC 81 MG tablet Take 81 mg by mouth daily.  . calcium carbonate (OS-CAL - DOSED IN MG OF ELEMENTAL CALCIUM) 1250 (500 Ca) MG tablet Take 1 tablet by mouth every other day.   . Cholecalciferol (D3-1000 PO) Take 2,000 capsules by mouth daily.   . metoprolol succinate (TOPROL-XL) 100 MG 24 hr tablet Take 100 mg by mouth daily.  . Oxcarbazepine (TRILEPTAL) 300 MG tablet TAKE 1 TABLET BY MOUTH EVERY MORNING AND 2 TABLETS EVERY NIGHT AT BEDTIME  . simvastatin (ZOCOR) 20 MG tablet Take 20 mg by mouth at bedtime.   . fluticasone (FLONASE) 50 MCG/ACT nasal spray Place 1 spray into both nostrils daily as needed for allergies or rhinitis.  Marland Kitchen ketoconazole (NIZORAL) 2 % cream Apply 1 application topically 2 (two) times daily as needed.   Marland Kitchen Propylene Glycol (SYSTANE BALANCE) 0.6 % SOLN Place 1 drop into both eyes 2 (two) times daily as needed (dry eyes).  . traMADol (ULTRAM) 50 MG tablet Take 50 mg by mouth every 12 (twelve) hours as needed.    No facility-administered encounter medications on file as of 08/07/2019.    ALLERGIES:  Allergies  Allergen Reactions  . Feldene [Piroxicam]     Headaches   . Lodine [Etodolac]     headaches     PHYSICAL EXAM:  ECOG Performance status: 1  Vitals:   08/07/19 1117  BP: (!) 150/59  Pulse: 74  Resp: 18  Temp: (!) 96.9 F (36.1 C)  SpO2: 100%   Filed Weights   08/07/19 1117  Weight: 158 lb 12.8 oz (72 kg)   Physical Exam Constitutional:      Appearance: Normal appearance. She is normal weight.  Cardiovascular:     Rate and Rhythm: Normal rate and regular rhythm.     Heart sounds: Normal heart sounds.  Pulmonary:     Effort: Pulmonary effort is  normal.     Breath sounds: Normal breath sounds.  Abdominal:     General: Bowel sounds are normal.     Palpations: Abdomen is soft.  Musculoskeletal:        General: Normal range of motion.  Skin:    General: Skin is warm.  Neurological:     Mental Status: She is alert and oriented to person, place, and time. Mental status is  at baseline.  Psychiatric:        Mood and Affect: Mood normal.        Behavior: Behavior normal.        Thought Content: Thought content normal.        Judgment: Judgment normal.      LABORATORY DATA:  I have reviewed the labs as listed.  CBC    Component Value Date/Time   WBC 6.7 05/26/2019 1602   RBC 3.65 (L) 05/26/2019 1602   HGB 10.2 (L) 05/26/2019 1602   HCT 33.4 (L) 05/26/2019 1602   PLT 278 05/26/2019 1602   MCV 91.5 05/26/2019 1602   MCH 27.9 05/26/2019 1602   MCHC 30.5 05/26/2019 1602   RDW 13.4 05/26/2019 1602   LYMPHSABS 2.2 05/26/2019 1602   MONOABS 0.8 05/26/2019 1602   EOSABS 0.2 05/26/2019 1602   BASOSABS 0.0 05/26/2019 1602   CMP Latest Ref Rng & Units 04/02/2019 08/07/2018 11/03/2017  Glucose 70 - 99 mg/dL 96 79 103(H)  BUN 8 - 23 mg/dL 17 21 20   Creatinine 0.44 - 1.00 mg/dL 0.89 0.68 0.58  Sodium 135 - 145 mmol/L 140 144 143  Potassium 3.5 - 5.1 mmol/L 3.4(L) 4.1 3.6  Chloride 98 - 111 mmol/L 97(L) 100 109  CO2 22 - 32 mmol/L 30 25 26   Calcium 8.9 - 10.3 mg/dL 9.8 10.4(H) 9.3  Total Protein 6.5 - 8.1 g/dL 7.5 - -  Total Bilirubin 0.3 - 1.2 mg/dL 0.4 - -  Alkaline Phos 38 - 126 U/L 79 - -  AST 15 - 41 U/L 21 - -  ALT 0 - 44 U/L 13 - -   All questions were answered to patient's stated satisfaction. Encouraged patient to call with any new concerns or questions before his next visit to the cancer center and we can certain see him sooner, if needed.     ASSESSMENT & PLAN:  Normocytic anemia 1. Normocytic anemia: -CBC on 04/02/2019 showed hemoglobin 10.2, hematocrit 32 and MCV of 95. WBC and platelets are normal. -Creatinine  was 0.89 with total protein of 7.5. -CBC on 04/28/2019 at Dr. Nolon Rod office showed hemoglobin 9.5 MCV of 89. White count was slightly low at 3.4 and platelets were 229. Differential was normal ferritin was 15 and normal 123456 and folic acid. Percent saturation was 9 oh reticulocyte was 2% -Colonoscopy on 02/29/2016 showed many small and large mouth diverticula in the entire colon. Grade 1 internal hemorrhoids. -CT abdomen pelvis on 04/02/2019 showed no bowel obstruction. Post lower ventral abdominal hernia repair with mild abdominal wall laxity. Moderate stool burden. Chronic diverticulosis. -To complete the work-up we check for SPEP which was negative. LDH was normal. Vitamin D was normal. Occult stool blood was negative. -Patient has tried taking oral iron in the past and could not tolerate it. -She received 2 Feraheme was on 06/03/2019 and 06/10/2019. -She did not have labs done today.  She reports she feels a lot better after her iron infusions.  Her energy levels are better. -We will recheck her back in 2 months with repeat labs.     Orders placed this encounter:  Orders Placed This Encounter  Procedures  . Methylmalonic acid, serum  . Lactate dehydrogenase  . CBC with Differential/Platelet  . Comprehensive metabolic panel  . Ferritin  . Iron and TIBC  . Vitamin B12  . Folate  . Copper, serum      Francene Finders, FNP-C [facility] [phone]

## 2019-08-07 NOTE — Patient Instructions (Signed)
[facility] at [facility] Discharge Instructions  Follow up in 2 months with labs    Thank you for choosing [facility] at [facility] to provide your oncology and hematology care.  To afford each patient quality time with our provider, please arrive at least 15 minutes before your scheduled appointment time.   If you have a lab appointment with the Cancer Center please come in thru the Main Entrance and check in at the main information desk.  You need to re-schedule your appointment should you arrive 10 or more minutes late.  We strive to give you quality time with our providers, and arriving late affects you and other patients whose appointments are after yours.  Also, if you no show three or more times for appointments you may be dismissed from the clinic at the providers discretion.     Again, thank you for choosing [facility].  Our hope is that these requests will decrease the amount of time that you wait before being seen by our physicians.       _____________________________________________________________  Should you have questions after your visit to [facility], please contact our office at [phone] between the hours of 8:00 a.m. and 4:30 p.m.  Voicemails left after 4:00 p.m. will not be returned until the following business day.  For prescription refill requests, have your pharmacy contact our office and allow 72 hours.    Due to Covid, you will need to wear a mask upon entering the hospital. If you do not have a mask, a mask will be given to you at the Main Entrance upon arrival. For doctor visits, patients may have 1 support person with them. For treatment visits, patients can not have anyone with them due to social distancing guidelines and our immunocompromised population.      

## 2019-09-02 DIAGNOSIS — Z85820 Personal history of malignant melanoma of skin: Secondary | ICD-10-CM | POA: Diagnosis not present

## 2019-09-02 DIAGNOSIS — L570 Actinic keratosis: Secondary | ICD-10-CM | POA: Diagnosis not present

## 2019-09-02 DIAGNOSIS — L821 Other seborrheic keratosis: Secondary | ICD-10-CM | POA: Diagnosis not present

## 2019-09-02 DIAGNOSIS — L814 Other melanin hyperpigmentation: Secondary | ICD-10-CM | POA: Diagnosis not present

## 2019-09-09 DIAGNOSIS — E7849 Other hyperlipidemia: Secondary | ICD-10-CM | POA: Diagnosis not present

## 2019-09-09 DIAGNOSIS — I10 Essential (primary) hypertension: Secondary | ICD-10-CM | POA: Diagnosis not present

## 2019-09-21 ENCOUNTER — Telehealth: Payer: Self-pay

## 2019-09-21 NOTE — Telephone Encounter (Signed)
A VM was left today asking to cancel appt and a call back is requested to this number: [phone].

## 2019-09-22 NOTE — Telephone Encounter (Signed)
Called to cancel appts

## 2019-09-25 DIAGNOSIS — E538 Deficiency of other specified B group vitamins: Secondary | ICD-10-CM | POA: Diagnosis not present

## 2019-10-08 ENCOUNTER — Inpatient Hospital Stay (HOSPITAL_COMMUNITY): Payer: Medicare HMO | Attending: Hematology

## 2019-10-14 ENCOUNTER — Ambulatory Visit: Payer: Medicare HMO | Admitting: Family Medicine

## 2019-10-15 ENCOUNTER — Ambulatory Visit (HOSPITAL_COMMUNITY): Payer: Medicare HMO | Admitting: Nurse Practitioner

## 2019-10-15 ENCOUNTER — Inpatient Hospital Stay (HOSPITAL_COMMUNITY): Payer: Medicare HMO | Attending: Hematology

## 2019-10-15 ENCOUNTER — Other Ambulatory Visit: Payer: Self-pay

## 2019-10-15 DIAGNOSIS — I10 Essential (primary) hypertension: Secondary | ICD-10-CM | POA: Diagnosis not present

## 2019-10-15 DIAGNOSIS — Z87891 Personal history of nicotine dependence: Secondary | ICD-10-CM | POA: Insufficient documentation

## 2019-10-15 DIAGNOSIS — D649 Anemia, unspecified: Secondary | ICD-10-CM | POA: Diagnosis not present

## 2019-10-15 LAB — CBC WITH DIFFERENTIAL/PLATELET
Abs Immature Granulocytes: 0.02 10*3/uL (ref 0.00–0.07)
Basophils Absolute: 0 10*3/uL (ref 0.0–0.1)
Basophils Relative: 1 %
Eosinophils Absolute: 0.2 10*3/uL (ref 0.0–0.5)
Eosinophils Relative: 3 %
HCT: 34.8 % — ABNORMAL LOW (ref 36.0–46.0)
Hemoglobin: 11.6 g/dL — ABNORMAL LOW (ref 12.0–15.0)
Immature Granulocytes: 0 %
Lymphocytes Relative: 36 %
Lymphs Abs: 1.8 10*3/uL (ref 0.7–4.0)
MCH: 32.7 pg (ref 26.0–34.0)
MCHC: 33.3 g/dL (ref 30.0–36.0)
MCV: 98 fL (ref 80.0–100.0)
Monocytes Absolute: 0.6 10*3/uL (ref 0.1–1.0)
Monocytes Relative: 13 %
Neutro Abs: 2.4 10*3/uL (ref 1.7–7.7)
Neutrophils Relative %: 47 %
Platelets: 198 10*3/uL (ref 150–400)
RBC: 3.55 MIL/uL — ABNORMAL LOW (ref 3.87–5.11)
RDW: 12.2 % (ref 11.5–15.5)
WBC: 5.1 10*3/uL (ref 4.0–10.5)
nRBC: 0 % (ref 0.0–0.2)

## 2019-10-15 LAB — VITAMIN B12: Vitamin B-12: 424 pg/mL (ref 180–914)

## 2019-10-15 LAB — IRON AND TIBC
Iron: 60 ug/dL (ref 28–170)
Saturation Ratios: 17 % (ref 10.4–31.8)
TIBC: 351 ug/dL (ref 250–450)
UIBC: 291 ug/dL

## 2019-10-15 LAB — FOLATE: Folate: 13.3 ng/mL (ref >5.9)

## 2019-10-15 LAB — COMPREHENSIVE METABOLIC PANEL
ALT: 16 U/L (ref 0–44)
AST: 21 U/L (ref 15–41)
Albumin: 4.2 g/dL (ref 3.5–5.0)
Alkaline Phosphatase: 87 U/L (ref 38–126)
Anion gap: 11 (ref 5–15)
BUN: 19 mg/dL (ref 8–23)
CO2: 29 mmol/L (ref 22–32)
Calcium: 9.5 mg/dL (ref 8.9–10.3)
Chloride: 102 mmol/L (ref 98–111)
Creatinine, Ser: 0.66 mg/dL (ref 0.44–1.00)
GFR calc Af Amer: >60 mL/min (ref >60)
GFR calc non Af Amer: >60 mL/min (ref >60)
Glucose, Bld: 102 mg/dL — ABNORMAL HIGH (ref 70–99)
Potassium: 3.4 mmol/L — ABNORMAL LOW (ref 3.5–5.1)
Sodium: 142 mmol/L (ref 135–145)
Total Bilirubin: 0.6 mg/dL (ref 0.3–1.2)
Total Protein: 7 g/dL (ref 6.5–8.1)

## 2019-10-15 LAB — FERRITIN: Ferritin: 79 ng/mL (ref 11–307)

## 2019-10-15 LAB — LACTATE DEHYDROGENASE: LDH: 158 U/L (ref 98–192)

## 2019-10-17 LAB — COPPER, SERUM: Copper: 100 ug/dL (ref 80–158)

## 2019-10-19 LAB — METHYLMALONIC ACID, SERUM: Methylmalonic Acid, Quantitative: 125 nmol/L (ref 0–378)

## 2019-11-04 ENCOUNTER — Encounter (HOSPITAL_COMMUNITY): Payer: Self-pay | Admitting: Nurse Practitioner

## 2019-11-04 ENCOUNTER — Other Ambulatory Visit: Payer: Self-pay

## 2019-11-04 ENCOUNTER — Inpatient Hospital Stay (HOSPITAL_COMMUNITY): Payer: Medicare HMO | Admitting: Nurse Practitioner

## 2019-11-04 DIAGNOSIS — D649 Anemia, unspecified: Secondary | ICD-10-CM

## 2019-11-04 DIAGNOSIS — Z87891 Personal history of nicotine dependence: Secondary | ICD-10-CM | POA: Diagnosis not present

## 2019-11-04 DIAGNOSIS — I10 Essential (primary) hypertension: Secondary | ICD-10-CM | POA: Diagnosis not present

## 2019-11-04 NOTE — Assessment & Plan Note (Addendum)
1. Normocytic anemia: -CBC on 04/02/2019 showed hemoglobin 10.2, hematocrit 32 and MCV of 95. WBC and platelets are normal. -Creatinine was 0.89 with total protein of 7.5. -CBC on 04/28/2019 at Dr. Nolon Rod office showed hemoglobin 9.5 MCV of 89. White count was slightly low at 3.4 and platelets were 229. Differential was normal ferritin was 15 and normal T61 and folic acid. Percent saturation was 9 oh reticulocyte was 2% -Colonoscopy on 02/29/2016 showed many small and large mouth diverticula in the entire colon. Grade 1 internal hemorrhoids. -CT abdomen pelvis on 04/02/2019 showed no bowel obstruction. Post lower ventral abdominal hernia repair with mild abdominal wall laxity. Moderate stool burden. Chronic diverticulosis. -To complete the work-up we check for SPEP which was negative. LDH was normal. Vitamin D was normal. Occult stool blood was negative. -Patient has tried taking oral iron in the past and could not tolerate it. -She received 2 Feraheme was on 06/03/2019 and 06/10/2019. -Labs done on 10/15/2019 showed hemoglobin 11.6, ferritin 79, percent saturation 17 -Patient wanted to try taking oral iron tablets again with a stool softener. -We will recheck her back in 5 months with repeat labs.

## 2019-11-04 NOTE — Progress Notes (Signed)
[address] [place], [place] [address]   CLINIC:  Medical Oncology/Hematology  PCP:  Redmond School, [place] [address] [phone]   REASON FOR VISIT: Follow-up for normocytic anemia    CURRENT THERAPY: Oral iron therapy and intermittent iron infusions   INTERVAL HISTORY:  Sara Fischer 79 y.o. female returns for routine follow-up for normocytic anemia.  Patient reports she is doing well since her last visit.  She reports her energy levels are much higher.  She reports she wanted to try taking oral iron supplementation again with a stool softener.  She is tolerating well so far.  She denies any bright red bleeding per rectum or melena.  She denies any easy bruising or bleeding. Denies any nausea, vomiting, or diarrhea. Denies any new pains. Had not noticed any recent bleeding such as epistaxis, hematuria or hematochezia. Denies recent chest pain on exertion, shortness of breath on minimal exertion, pre-syncopal episodes, or palpitations. Denies any numbness or tingling in hands or feet. Denies any recent fevers, infections, or recent hospitalizations. Patient reports appetite at 100% and energy level at 100%. She is eating well and maintaining her weight at this time.      REVIEW OF SYSTEMS:  Review of Systems  Cardiovascular: Positive for leg swelling.  Neurological: Positive for headaches.  All other systems reviewed and are negative.    PAST MEDICAL/SURGICAL HISTORY:  Past Medical History:  Diagnosis Date  . High cholesterol   . Hypertension    Past Surgical History:  Procedure Laterality Date  . ABDOMINAL HYSTERECTOMY    . APPENDECTOMY    . COLONOSCOPY  2010   Dr. Gala Romney: pancolonic diverticulosis, sigmoid colon tubular adenoma removed.   . COLONOSCOPY N/A 02/29/2016   Procedure: COLONOSCOPY;  Surgeon: Daneil Dolin, MD;  [facility];  Service: Endoscopy;  Laterality: N/A;  230  . INCISIONAL HERNIA  REPAIR N/A 12/28/2016   Procedure: HERNIA REPAIR INCISIONAL;  Surgeon: Aviva Signs, MD;  Location: [facility];  Service: General;  Laterality: N/A;  . KNEE ARTHROSCOPY     bilateral  . LAPAROSCOPIC BILATERAL SALPINGO OOPHERECTOMY  2001  . PARTIAL HYSTERECTOMY  1979     SOCIAL HISTORY:  Social History   Socioeconomic History  . Marital status: Widowed    Spouse name: Not on file  . Number of children: 4  . Years of education: Not on file  . Highest education level: Not on file  Occupational History  . Occupation: Retired  Tobacco Use  . Smoking status: Former Smoker    Packs/day: 0.50    Years: 5.00    Pack years: 2.50    Types: Cigarettes    Start date: 08/28/1963    Quit date: 11/02/1968    Years since quitting: 51.0  . Smokeless tobacco: Never Used  Vaping Use  . Vaping Use: Never used  Substance and Sexual Activity  . Alcohol use: Yes    Alcohol/week: 0.0 standard drinks    Comment: wine occas.  . Drug use: No  . Sexual activity: Not on file  Other Topics Concern  . Not on file  Social History Narrative   Right handed    Caffeine use: 1 cup coffee every morning   1 soda per day   Social Determinants of Health   Financial Resource Strain:   . Difficulty of Paying Living Expenses: Not on file  Food Insecurity:   . Worried About Charity fundraiser in the  Last Year: Not on file  . Ran Out of Food in the Last Year: Not on file  Transportation Needs:   . Lack of Transportation (Medical): Not on file  . Lack of Transportation (Non-Medical): Not on file  Physical Activity:   . Days of Exercise per Week: Not on file  . Minutes of Exercise per Session: Not on file  Stress:   . Feeling of Stress : Not on file  Social Connections:   . Frequency of Communication with Friends and Family: Not on file  . Frequency of Social Gatherings with Friends and Family: Not on file  . Attends Religious Services: Not on file  . Active Member of Clubs or Organizations: Not on file   . Attends Archivist Meetings: Not on file  . Marital Status: Not on file  Intimate Partner Violence:   . Fear of Current or Ex-Partner: Not on file  . Emotionally Abused: Not on file  . Physically Abused: Not on file  . Sexually Abused: Not on file    FAMILY HISTORY:  Family History  Problem Relation Age of Onset  . Diabetes Mother   . Heart disease Mother   . Heart disease Father 64  . Dementia Sister   . Heart disease Brother   . Diabetes Sister   . Heart disease Sister   . Heart disease Sister   . Diabetes Sister   . Arthritis Sister   . Heart disease Sister   . Heart disease Brother   . Healthy Son   . Healthy Son   . Colon cancer Neg Hx     CURRENT MEDICATIONS:  Outpatient Encounter Medications as of 11/04/2019  Medication Sig  . aspirin EC 81 MG tablet Take 81 mg by mouth daily.  . calcium carbonate (OS-CAL - DOSED IN MG OF ELEMENTAL CALCIUM) 1250 (500 Ca) MG tablet Take 1 tablet by mouth every other day.   . Cholecalciferol (D3-1000 PO) Take 2,000 capsules by mouth daily.   . ferrous sulfate 325 (65 FE) MG tablet Take 325 mg by mouth daily with breakfast.  . fluticasone (FLONASE) 50 MCG/ACT nasal spray Place 1 spray into both nostrils daily as needed for allergies or rhinitis.  Marland Kitchen ketoconazole (NIZORAL) 2 % cream Apply 1 application topically 2 (two) times daily as needed.   . metoprolol succinate (TOPROL-XL) 100 MG 24 hr tablet Take 100 mg by mouth daily.  . Oxcarbazepine (TRILEPTAL) 300 MG tablet TAKE 1 TABLET BY MOUTH EVERY MORNING AND 2 TABLETS EVERY NIGHT AT BEDTIME  . Propylene Glycol (SYSTANE BALANCE) 0.6 % SOLN Place 1 drop into both eyes 2 (two) times daily as needed (dry eyes).  . simvastatin (ZOCOR) 20 MG tablet Take 20 mg by mouth at bedtime.   . traMADol (ULTRAM) 50 MG tablet Take 50 mg by mouth every 12 (twelve) hours as needed.    No facility-administered encounter medications on file as of 11/04/2019.    ALLERGIES:  Allergies  Allergen  Reactions  . Feldene [Piroxicam]     Headaches   . Lodine [Etodolac]     headaches     PHYSICAL EXAM:  ECOG Performance status: 1  Vitals:   11/04/19 0939  BP: (!) 145/60  Pulse: 80  Resp: 18  Temp: (!) 96.9 F (36.1 C)  SpO2: 98%   Filed Weights   11/04/19 0939  Weight: 157 lb 9.6 oz (71.5 kg)     Physical Exam Constitutional:      Appearance: Normal appearance.  She is normal weight.  Cardiovascular:     Rate and Rhythm: Normal rate and regular rhythm.     Heart sounds: Normal heart sounds.  Pulmonary:     Effort: Pulmonary effort is normal.     Breath sounds: Normal breath sounds.  Abdominal:     General: Bowel sounds are normal.     Palpations: Abdomen is soft.  Musculoskeletal:        General: Normal range of motion.  Skin:    General: Skin is warm.  Neurological:     Mental Status: She is alert and oriented to person, place, and time. Mental status is at baseline.  Psychiatric:        Mood and Affect: Mood normal.        Behavior: Behavior normal.        Thought Content: Thought content normal.        Judgment: Judgment normal.      LABORATORY DATA:  I have reviewed the labs as listed.  CBC    Component Value Date/Time   WBC 5.1 10/15/2019 0914   RBC 3.55 (L) 10/15/2019 0914   HGB 11.6 (L) 10/15/2019 0914   HCT 34.8 (L) 10/15/2019 0914   PLT 198 10/15/2019 0914   MCV 98.0 10/15/2019 0914   MCH 32.7 10/15/2019 0914   MCHC 33.3 10/15/2019 0914   RDW 12.2 10/15/2019 0914   LYMPHSABS 1.8 10/15/2019 0914   MONOABS 0.6 10/15/2019 0914   EOSABS 0.2 10/15/2019 0914   BASOSABS 0.0 10/15/2019 0914   CMP Latest Ref Rng & Units 10/15/2019 04/02/2019 08/07/2018  Glucose 70 - 99 mg/dL 102(H) 96 79  BUN 8 - 23 mg/dL 19 17 21   Creatinine 0.44 - 1.00 mg/dL 0.66 0.89 0.68  Sodium 135 - 145 mmol/L 142 140 144  Potassium 3.5 - 5.1 mmol/L 3.4(L) 3.4(L) 4.1  Chloride 98 - 111 mmol/L 102 97(L) 100  CO2 22 - 32 mmol/L 29 30 25   Calcium 8.9 - 10.3 mg/dL 9.5 9.8  10.4(H)  Total Protein 6.5 - 8.1 g/dL 7.0 7.5 -  Total Bilirubin 0.3 - 1.2 mg/dL 0.6 0.4 -  Alkaline Phos 38 - 126 U/L 87 79 -  AST 15 - 41 U/L 21 21 -  ALT 0 - 44 U/L 16 13 -    All questions were answered to patient's stated satisfaction. Encouraged patient to call with any new concerns or questions before his next visit to the cancer center and we can certain see him sooner, if needed.     ASSESSMENT & PLAN:  Normocytic anemia 1. Normocytic anemia: -CBC on 04/02/2019 showed hemoglobin 10.2, hematocrit 32 and MCV of 95. WBC and platelets are normal. -Creatinine was 0.89 with total protein of 7.5. -CBC on 04/28/2019 at Dr. Nolon Rod office showed hemoglobin 9.5 MCV of 89. White count was slightly low at 3.4 and platelets were 229. Differential was normal ferritin was 15 and normal O75 and folic acid. Percent saturation was 9 oh reticulocyte was 2% -Colonoscopy on 02/29/2016 showed many small and large mouth diverticula in the entire colon. Grade 1 internal hemorrhoids. -CT abdomen pelvis on 04/02/2019 showed no bowel obstruction. Post lower ventral abdominal hernia repair with mild abdominal wall laxity. Moderate stool burden. Chronic diverticulosis. -To complete the work-up we check for SPEP which was negative. LDH was normal. Vitamin D was normal. Occult stool blood was negative. -Patient has tried taking oral iron in the past and could not tolerate it. -She received 2 Feraheme was on  06/03/2019 and 06/10/2019. -Labs done on 10/15/2019 showed hemoglobin 11.6, ferritin 79, percent saturation 17 -Patient wanted to try taking oral iron tablets again with a stool softener. -We will recheck her back in 5 months with repeat labs.     Orders placed this encounter:  Orders Placed This Encounter  Procedures  . CBC with Differential/Platelet  . Comprehensive metabolic panel  . Ferritin  . Iron and TIBC  . Lactate dehydrogenase  . Vitamin B12  . VITAMIN D 25 Hydroxy (Vit-D Deficiency, Fractures)       Francene Finders, FNP-C [place] [phone]

## 2019-11-10 DIAGNOSIS — E7849 Other hyperlipidemia: Secondary | ICD-10-CM | POA: Diagnosis not present

## 2019-11-10 DIAGNOSIS — I10 Essential (primary) hypertension: Secondary | ICD-10-CM | POA: Diagnosis not present

## 2019-11-27 DIAGNOSIS — E538 Deficiency of other specified B group vitamins: Secondary | ICD-10-CM | POA: Diagnosis not present

## 2019-11-27 DIAGNOSIS — Z23 Encounter for immunization: Secondary | ICD-10-CM | POA: Diagnosis not present

## 2019-12-14 ENCOUNTER — Other Ambulatory Visit: Payer: Self-pay | Admitting: *Deleted

## 2019-12-14 MED ORDER — OXCARBAZEPINE 300 MG PO TABS: ORAL_TABLET | ORAL | 0 refills | Status: DC

## 2019-12-28 DIAGNOSIS — E538 Deficiency of other specified B group vitamins: Secondary | ICD-10-CM | POA: Diagnosis not present

## 2020-01-08 ENCOUNTER — Other Ambulatory Visit (HOSPITAL_COMMUNITY): Payer: Self-pay | Admitting: Internal Medicine

## 2020-01-08 DIAGNOSIS — Z1231 Encounter for screening mammogram for malignant neoplasm of breast: Secondary | ICD-10-CM

## 2020-01-09 DIAGNOSIS — E7849 Other hyperlipidemia: Secondary | ICD-10-CM | POA: Diagnosis not present

## 2020-01-09 DIAGNOSIS — I10 Essential (primary) hypertension: Secondary | ICD-10-CM | POA: Diagnosis not present

## 2020-01-27 DIAGNOSIS — E538 Deficiency of other specified B group vitamins: Secondary | ICD-10-CM | POA: Diagnosis not present

## 2020-02-09 DIAGNOSIS — E7849 Other hyperlipidemia: Secondary | ICD-10-CM | POA: Diagnosis not present

## 2020-02-09 DIAGNOSIS — I10 Essential (primary) hypertension: Secondary | ICD-10-CM | POA: Diagnosis not present

## 2020-02-15 ENCOUNTER — Ambulatory Visit (HOSPITAL_COMMUNITY): Payer: Medicare HMO

## 2020-02-25 DIAGNOSIS — E538 Deficiency of other specified B group vitamins: Secondary | ICD-10-CM | POA: Diagnosis not present

## 2020-03-01 DIAGNOSIS — Z85828 Personal history of other malignant neoplasm of skin: Secondary | ICD-10-CM | POA: Diagnosis not present

## 2020-03-01 DIAGNOSIS — L218 Other seborrheic dermatitis: Secondary | ICD-10-CM | POA: Diagnosis not present

## 2020-03-01 DIAGNOSIS — L570 Actinic keratosis: Secondary | ICD-10-CM | POA: Diagnosis not present

## 2020-03-02 ENCOUNTER — Ambulatory Visit (HOSPITAL_COMMUNITY)
Admission: RE | Admit: 2020-03-02 | Discharge: 2020-03-02 | Disposition: A | Discharge status: Home / Self Care | Payer: Medicare HMO | Source: Ambulatory Visit | Attending: Internal Medicine | Admitting: Internal Medicine

## 2020-03-02 ENCOUNTER — Other Ambulatory Visit: Payer: Self-pay

## 2020-03-02 DIAGNOSIS — Z1231 Encounter for screening mammogram for malignant neoplasm of breast: Secondary | ICD-10-CM | POA: Insufficient documentation

## 2020-03-11 DIAGNOSIS — E7849 Other hyperlipidemia: Secondary | ICD-10-CM | POA: Diagnosis not present

## 2020-03-11 DIAGNOSIS — I10 Essential (primary) hypertension: Secondary | ICD-10-CM | POA: Diagnosis not present

## 2020-03-25 DIAGNOSIS — E538 Deficiency of other specified B group vitamins: Secondary | ICD-10-CM | POA: Diagnosis not present

## 2020-04-09 DIAGNOSIS — E7849 Other hyperlipidemia: Secondary | ICD-10-CM | POA: Diagnosis not present

## 2020-04-09 DIAGNOSIS — I10 Essential (primary) hypertension: Secondary | ICD-10-CM | POA: Diagnosis not present

## 2020-04-12 ENCOUNTER — Other Ambulatory Visit (HOSPITAL_COMMUNITY): Payer: Self-pay | Admitting: *Deleted

## 2020-04-12 DIAGNOSIS — D649 Anemia, unspecified: Secondary | ICD-10-CM

## 2020-04-13 ENCOUNTER — Other Ambulatory Visit: Payer: Self-pay

## 2020-04-13 ENCOUNTER — Inpatient Hospital Stay (HOSPITAL_COMMUNITY): Payer: Medicare HMO | Attending: Hematology

## 2020-04-13 DIAGNOSIS — Z87891 Personal history of nicotine dependence: Secondary | ICD-10-CM | POA: Insufficient documentation

## 2020-04-13 DIAGNOSIS — E7800 Pure hypercholesterolemia, unspecified: Secondary | ICD-10-CM | POA: Diagnosis not present

## 2020-04-13 DIAGNOSIS — Z79899 Other long term (current) drug therapy: Secondary | ICD-10-CM | POA: Insufficient documentation

## 2020-04-13 DIAGNOSIS — D649 Anemia, unspecified: Secondary | ICD-10-CM | POA: Insufficient documentation

## 2020-04-13 DIAGNOSIS — I10 Essential (primary) hypertension: Secondary | ICD-10-CM | POA: Insufficient documentation

## 2020-04-13 LAB — COMPREHENSIVE METABOLIC PANEL
ALT: 13 U/L (ref 0–44)
AST: 19 U/L (ref 15–41)
Albumin: 4 g/dL (ref 3.5–5.0)
Alkaline Phosphatase: 69 U/L (ref 38–126)
Anion gap: 7 (ref 5–15)
BUN: 18 mg/dL (ref 8–23)
CO2: 32 mmol/L (ref 22–32)
Calcium: 9.9 mg/dL (ref 8.9–10.3)
Chloride: 102 mmol/L (ref 98–111)
Creatinine, Ser: 0.75 mg/dL (ref 0.44–1.00)
GFR, Estimated: >60 mL/min (ref >60)
Glucose, Bld: 109 mg/dL — ABNORMAL HIGH (ref 70–99)
Potassium: 3.4 mmol/L — ABNORMAL LOW (ref 3.5–5.1)
Sodium: 141 mmol/L (ref 135–145)
Total Bilirubin: 0.6 mg/dL (ref 0.3–1.2)
Total Protein: 7.2 g/dL (ref 6.5–8.1)

## 2020-04-13 LAB — IRON AND TIBC
Iron: 53 ug/dL (ref 28–170)
Saturation Ratios: 14 % (ref 10.4–31.8)
TIBC: 389 ug/dL (ref 250–450)
UIBC: 336 ug/dL

## 2020-04-13 LAB — VITAMIN B12: Vitamin B-12: 569 pg/mL (ref 180–914)

## 2020-04-13 LAB — CBC WITH DIFFERENTIAL/PLATELET
Abs Immature Granulocytes: 0.01 10*3/uL (ref 0.00–0.07)
Basophils Absolute: 0 10*3/uL (ref 0.0–0.1)
Basophils Relative: 1 %
Eosinophils Absolute: 0.2 10*3/uL (ref 0.0–0.5)
Eosinophils Relative: 3 %
HCT: 36.1 % (ref 36.0–46.0)
Hemoglobin: 11.8 g/dL — ABNORMAL LOW (ref 12.0–15.0)
Immature Granulocytes: 0 %
Lymphocytes Relative: 36 %
Lymphs Abs: 1.9 10*3/uL (ref 0.7–4.0)
MCH: 31.6 pg (ref 26.0–34.0)
MCHC: 32.7 g/dL (ref 30.0–36.0)
MCV: 96.8 fL (ref 80.0–100.0)
Monocytes Absolute: 0.6 10*3/uL (ref 0.1–1.0)
Monocytes Relative: 12 %
Neutro Abs: 2.5 10*3/uL (ref 1.7–7.7)
Neutrophils Relative %: 48 %
Platelets: 217 10*3/uL (ref 150–400)
RBC: 3.73 MIL/uL — ABNORMAL LOW (ref 3.87–5.11)
RDW: 12.4 % (ref 11.5–15.5)
WBC: 5.2 10*3/uL (ref 4.0–10.5)
nRBC: 0 % (ref 0.0–0.2)

## 2020-04-13 LAB — LACTATE DEHYDROGENASE: LDH: 135 U/L (ref 98–192)

## 2020-04-13 LAB — FERRITIN: Ferritin: 62 ng/mL (ref 11–307)

## 2020-04-13 LAB — VITAMIN D 25 HYDROXY (VIT D DEFICIENCY, FRACTURES): Vit D, 25-Hydroxy: 93.39 ng/mL (ref 30–100)

## 2020-04-20 ENCOUNTER — Inpatient Hospital Stay (HOSPITAL_COMMUNITY): Payer: Medicare HMO | Admitting: Hematology

## 2020-04-20 ENCOUNTER — Other Ambulatory Visit: Payer: Self-pay

## 2020-04-20 VITALS — BP 151/59 | HR 74 | Temp 98.1°F | Resp 16 | Wt 148.8 lb

## 2020-04-20 DIAGNOSIS — E7800 Pure hypercholesterolemia, unspecified: Secondary | ICD-10-CM | POA: Diagnosis not present

## 2020-04-20 DIAGNOSIS — Z87891 Personal history of nicotine dependence: Secondary | ICD-10-CM | POA: Diagnosis not present

## 2020-04-20 DIAGNOSIS — D649 Anemia, unspecified: Secondary | ICD-10-CM | POA: Diagnosis not present

## 2020-04-20 DIAGNOSIS — Z79899 Other long term (current) drug therapy: Secondary | ICD-10-CM | POA: Diagnosis not present

## 2020-04-20 DIAGNOSIS — I10 Essential (primary) hypertension: Secondary | ICD-10-CM | POA: Diagnosis not present

## 2020-04-20 NOTE — Patient Instructions (Signed)
[place] at [facility] Discharge Instructions  You were seen today by Dr. Delton Coombes. He went over your recent results and scans. Continue taking iron tablets daily. Your next appointment will be with the physician assistant in 6 months for labs and follow up.   Thank you for choosing [place] at [facility] to provide your oncology and hematology care.  To afford each patient quality time with our provider, please arrive at least 15 minutes before your scheduled appointment time.   If you have a lab appointment with the [facility] please come in thru the Main Entrance and check in at the main information desk  You need to re-schedule your appointment should you arrive 10 or more minutes late.  We strive to give you quality time with our providers, and arriving late affects you and other patients whose appointments are after yours.  Also, if you no show three or more times for appointments you may be dismissed from the clinic at the providers discretion.     Again, thank you for choosing [facility].  Our hope is that these requests will decrease the amount of time that you wait before being seen by our physicians.       _____________________________________________________________  Should you have questions after your visit to [facility], please contact our office at [phone] between the hours of 8:00 a.m. and 4:30 p.m.  Voicemails left after 4:00 p.m. will not be returned until the following business day.  For prescription refill requests, have your pharmacy contact our office and allow 72 hours.    Cancer Center Support Programs:   > Cancer Support Group  2nd Tuesday of the month 1pm-2pm, Journey Room

## 2020-04-20 NOTE — Progress Notes (Signed)
[place], [place] [address]   CLINIC:  Medical Oncology/Hematology  PCP:  [facility], [address] / [place] [address]  [phone]  REASON FOR VISIT:  Follow-up for normocytic anemia  PRIOR THERAPY: None  CURRENT THERAPY: Oral iron tablets and intermittent Feraheme last on 06/10/2019  INTERVAL HISTORY:  Ms. Sara Fischer, a 80 y.o. 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 Grasiela was last seen by Francene Finders, NP, on 11/04/2019.  Today she reports feeling well. She is taking an iron tablet once daily at bedtime, though she notes having some constipation; she is taking a stool softener. Her appetite and energy levels are excellent. She dances 4 times per week. She takes vitamin D 2,000 units daily.   REVIEW OF SYSTEMS:  Review of Systems  Constitutional: Negative for appetite change and fatigue.  Gastrointestinal: Positive for constipation (controlled w/ stool softener).    PAST MEDICAL/SURGICAL HISTORY:  Past Medical History:  Diagnosis Date  . High cholesterol   . Hypertension    Past Surgical History:  Procedure Laterality Date  . ABDOMINAL HYSTERECTOMY    . APPENDECTOMY    . COLONOSCOPY  2010   Dr. Gala Romney: pancolonic diverticulosis, sigmoid colon tubular adenoma removed.   . COLONOSCOPY N/A 02/29/2016   Procedure: COLONOSCOPY;  Surgeon: Daneil Dolin, MD;  [facility];  Service: Endoscopy;  Laterality: N/A;  230  . INCISIONAL HERNIA REPAIR N/A 12/28/2016   Procedure: HERNIA REPAIR INCISIONAL;  Surgeon: Aviva Signs, MD;  Location: [facility];  Service: General;  Laterality: N/A;  . KNEE ARTHROSCOPY     bilateral  . LAPAROSCOPIC BILATERAL SALPINGO OOPHERECTOMY  2001  . PARTIAL HYSTERECTOMY  1979    SOCIAL HISTORY:  Social History   Socioeconomic History  . Marital status: Widowed    Spouse name: Not on file  . Number of children: 4  . Years of education: Not on  file  . Highest education level: Not on file  Occupational History  . Occupation: Retired  Tobacco Use  . Smoking status: Former Smoker    Packs/day: 0.50    Years: 5.00    Pack years: 2.50    Types: Cigarettes    Start date: 08/28/1963    Quit date: 11/02/1968    Years since quitting: 51.4  . Smokeless tobacco: Never Used  Vaping Use  . Vaping Use: Never used  Substance and Sexual Activity  . Alcohol use: Yes    Alcohol/week: 0.0 standard drinks    Comment: wine occas.  . Drug use: No  . Sexual activity: Not on file  Other Topics Concern  . Not on file  Social History Narrative   Right handed    Caffeine use: 1 cup coffee every morning   1 soda per day   Social Determinants of Health   Financial Resource Strain: Not on file  Food Insecurity: Not on file  Transportation Needs: Not on file  Physical Activity: Not on file  Stress: Not on file  Social Connections: Not on file  Intimate Partner Violence: Not on file    FAMILY HISTORY:  Family History  Problem Relation Age of Onset  . Diabetes Mother   . Heart disease Mother   . Heart disease Father 32  . Dementia Sister   . Heart disease Brother   . Diabetes Sister   . Heart disease Sister   . Heart disease Sister   . Diabetes Sister   .  Arthritis Sister   . Heart disease Sister   . Heart disease Brother   . Healthy Son   . Healthy Son   . Colon cancer Neg Hx     CURRENT MEDICATIONS:  Current Outpatient Medications  Medication Sig Dispense Refill  . aspirin EC 81 MG tablet Take 81 mg by mouth daily.    . calcium carbonate (OS-CAL - DOSED IN MG OF ELEMENTAL CALCIUM) 1250 (500 Ca) MG tablet Take 1 tablet by mouth every other day.     . Cholecalciferol (D3-1000 PO) Take 2,000 capsules by mouth daily.     . ferrous sulfate 325 (65 FE) MG tablet Take 325 mg by mouth at bedtime.    . fluticasone (FLONASE) 50 MCG/ACT nasal spray Place 1 spray into both nostrils daily as needed for allergies or rhinitis.    .  furosemide (LASIX) 40 MG tablet     . ketoconazole (NIZORAL) 2 % cream Apply 1 application topically 2 (two) times daily as needed.     . metoprolol succinate (TOPROL-XL) 100 MG 24 hr tablet Take 100 mg by mouth daily.    . Oxcarbazepine (TRILEPTAL) 300 MG tablet TAKE 1 TABLET BY MOUTH EVERY MORNING AND 2 TABLETS EVERY NIGHT AT BEDTIME 90 tablet 0  . Propylene Glycol 0.6 % SOLN Place 1 drop into both eyes 2 (two) times daily as needed (dry eyes).    . simvastatin (ZOCOR) 20 MG tablet Take 20 mg by mouth at bedtime.     . traMADol (ULTRAM) 50 MG tablet Take 50 mg by mouth every 12 (twelve) hours as needed.      No current facility-administered medications for this visit.    ALLERGIES:  Allergies  Allergen Reactions  . Feldene [Piroxicam]     Headaches   . Lodine [Etodolac]     headaches    PHYSICAL EXAM:  Performance status (ECOG): 1 - Symptomatic but completely ambulatory  Vitals:   04/20/20 1122  BP: (!) 151/59  Pulse: 74  Resp: 16  Temp: 98.1 F (36.7 C)  SpO2: 99%   Wt Readings from Last 3 Encounters:  04/20/20 148 lb 12.8 oz (67.5 kg)  11/04/19 157 lb 9.6 oz (71.5 kg)  08/07/19 158 lb 12.8 oz (72 kg)   Physical Exam Vitals reviewed.  Constitutional:      Appearance: Normal appearance.  Cardiovascular:     Rate and Rhythm: Normal rate and regular rhythm.     Pulses: Normal pulses.     Heart sounds: Normal heart sounds.  Pulmonary:     Effort: Pulmonary effort is normal.     Breath sounds: Normal breath sounds.  Chest:  Breasts:     Right: No supraclavicular adenopathy.     Left: No supraclavicular adenopathy.    Abdominal:     Palpations: Abdomen is soft. There is no hepatomegaly, splenomegaly or mass.     Tenderness: There is no abdominal tenderness.  Musculoskeletal:     Right lower leg: No edema.     Left lower leg: No edema.  Lymphadenopathy:     Cervical: No cervical adenopathy.     Upper Body:     Right upper body: No supraclavicular adenopathy.      Left upper body: No supraclavicular adenopathy.  Neurological:     General: No focal deficit present.     Mental Status: She is alert and oriented to person, place, and time.  Psychiatric:        Mood and Affect: Mood normal.  Behavior: Behavior normal.     LABORATORY DATA:  I have reviewed the labs as listed.  CBC Latest Ref Rng & Units 04/13/2020 10/15/2019 05/26/2019  WBC 4.0 - 10.5 K/uL 5.2 5.1 6.7  Hemoglobin 12.0 - 15.0 g/dL 11.8(L) 11.6(L) 10.2(L)  Hematocrit 36.0 - 46.0 % 36.1 34.8(L) 33.4(L)  Platelets 150 - 400 K/uL 217 198 278   CMP Latest Ref Rng & Units 04/13/2020 10/15/2019 04/02/2019  Glucose 70 - 99 mg/dL 109(H) 102(H) 96  BUN 8 - 23 mg/dL 18 19 17   Creatinine 0.44 - 1.00 mg/dL 0.75 0.66 0.89  Sodium 135 - 145 mmol/L 141 142 140  Potassium 3.5 - 5.1 mmol/L 3.4(L) 3.4(L) 3.4(L)  Chloride 98 - 111 mmol/L 102 102 97(L)  CO2 22 - 32 mmol/L 32 29 30  Calcium 8.9 - 10.3 mg/dL 9.9 9.5 9.8  Total Protein 6.5 - 8.1 g/dL 7.2 7.0 7.5  Total Bilirubin 0.3 - 1.2 mg/dL 0.6 0.6 0.4  Alkaline Phos 38 - 126 U/L 69 87 79  AST 15 - 41 U/L 19 21 21   ALT 0 - 44 U/L 13 16 13       Component Value Date/Time   RBC 3.73 (L) 04/13/2020 1019   MCV 96.8 04/13/2020 1019   MCH 31.6 04/13/2020 1019   MCHC 32.7 04/13/2020 1019   RDW 12.4 04/13/2020 1019   LYMPHSABS 1.9 04/13/2020 1019   MONOABS 0.6 04/13/2020 1019   EOSABS 0.2 04/13/2020 1019   BASOSABS 0.0 04/13/2020 1019   Lab Results  Component Value Date   LDH 135 04/13/2020   LDH 158 10/15/2019   LDH 164 08/03/2019   Lab Results  Component Value Date   VD25OH 93.39 04/13/2020   VD25OH 67.17 08/03/2019   VD25OH 57.33 05/26/2019   Lab Results  Component Value Date   TIBC 389 04/13/2020   TIBC 351 10/15/2019   FERRITIN 62 04/13/2020   FERRITIN 79 10/15/2019   IRONPCTSAT 14 04/13/2020   IRONPCTSAT 17 10/15/2019    DIAGNOSTIC IMAGING:  I have independently reviewed the scans and discussed with the patient. No  results found.   ASSESSMENT:  1. Normocytic anemia: -CBC on 04/02/2019 showed hemoglobin 10.2, hematocrit 32 and MCV of 95. WBC and platelets are normal. -Creatinine was 0.89 with total protein of 7.5. -CBC on 04/28/2019 at Dr. Nolon Rod office showed hemoglobin 9.5 MCV of 89. White count was slightly low at 3.4 and platelets were 229. Differential was normal ferritin was 15 and normal Q25 and folic acid. Percent saturation was 9 oh reticulocyte was 2% -Colonoscopy on 02/29/2016 showed many small and large mouth diverticula in the entire colon. Grade 1 internal hemorrhoids. -CT abdomen pelvis on 04/02/2019 showed no bowel obstruction. Post lower ventral abdominal hernia repair with mild abdominal wall laxity. Moderate stool burden. Chronic diverticulosis. -To complete the work-up we check for SPEP which was negative. LDH was normal. Vitamin D was normal. Occult stool blood was negative. -Patient has tried taking oral iron in the past and could not tolerate it. -She received 2 Feraheme was on 06/03/2019 and 06/10/2019.    PLAN:  1. Normocytic anemia: -She is taking iron tablet daily with stool softener. -Reviewed labs from 04/13/2020.  Ferritin is 62 down from 75.  B12 and vitamin D are normal. -Hemoglobin is staying at 11.8. -Continue iron tablet with stool softener at this time.  No parenteral iron therapy is indicated. -RTC 6 months with labs.   Orders placed this encounter:  No orders of the defined  types were placed in this encounter.    Derek Jack, MD [place] [phone]   I, Milinda Antis, am acting as a scribe for Dr. Sanda Linger.  I, Derek Jack MD, have reviewed the above documentation for accuracy and completeness, and I agree with the above.

## 2020-04-25 DIAGNOSIS — E538 Deficiency of other specified B group vitamins: Secondary | ICD-10-CM | POA: Diagnosis not present

## 2020-05-02 DIAGNOSIS — E7849 Other hyperlipidemia: Secondary | ICD-10-CM | POA: Diagnosis not present

## 2020-05-02 DIAGNOSIS — Z1389 Encounter for screening for other disorder: Secondary | ICD-10-CM | POA: Diagnosis not present

## 2020-05-02 DIAGNOSIS — E663 Overweight: Secondary | ICD-10-CM | POA: Diagnosis not present

## 2020-05-02 DIAGNOSIS — Z0000 Encounter for general adult medical examination without abnormal findings: Secondary | ICD-10-CM | POA: Diagnosis not present

## 2020-05-02 DIAGNOSIS — Z6827 Body mass index (BMI) 27.0-27.9, adult: Secondary | ICD-10-CM | POA: Diagnosis not present

## 2020-05-02 DIAGNOSIS — Z1331 Encounter for screening for depression: Secondary | ICD-10-CM | POA: Diagnosis not present

## 2020-05-09 DIAGNOSIS — I10 Essential (primary) hypertension: Secondary | ICD-10-CM | POA: Diagnosis not present

## 2020-05-09 DIAGNOSIS — E7849 Other hyperlipidemia: Secondary | ICD-10-CM | POA: Diagnosis not present

## 2020-05-10 ENCOUNTER — Other Ambulatory Visit: Payer: Self-pay

## 2020-05-10 ENCOUNTER — Ambulatory Visit
Admission: EM | Admit: 2020-05-10 | Discharge: 2020-05-10 | Disposition: A | Discharge status: Home / Self Care | Payer: Medicare HMO | Attending: Emergency Medicine | Admitting: Emergency Medicine

## 2020-05-10 ENCOUNTER — Encounter: Payer: Self-pay | Admitting: Emergency Medicine

## 2020-05-10 DIAGNOSIS — Z20822 Contact with and (suspected) exposure to covid-19: Secondary | ICD-10-CM

## 2020-05-10 NOTE — ED Triage Notes (Signed)
Here for covid test only 

## 2020-05-11 LAB — SARS-COV-2, NAA 2 DAY TAT

## 2020-05-11 LAB — NOVEL CORONAVIRUS, NAA: SARS-CoV-2, NAA: DETECTED — AB

## 2020-05-12 ENCOUNTER — Telehealth: Payer: Self-pay

## 2020-05-12 NOTE — Telephone Encounter (Signed)
Called to discuss with patient about COVID-19 symptoms and the use of one of the available treatments for those with mild to moderate Covid symptoms and at a high risk of hospitalization.  Pt appears to qualify for outpatient treatment due to co-morbid conditions and/or a member of an at-risk group in accordance with the FDA Emergency Use Authorization.    Symptom onset: Unknown Vaccinated: Unknown Booster? Unknown Immunocompromised?  Qualifiers: HTN  Unable to reach pt - Left message and call back number [phone].   Sara Fischer

## 2020-05-25 DIAGNOSIS — E538 Deficiency of other specified B group vitamins: Secondary | ICD-10-CM | POA: Diagnosis not present

## 2020-05-25 DIAGNOSIS — E7849 Other hyperlipidemia: Secondary | ICD-10-CM | POA: Diagnosis not present

## 2020-05-25 DIAGNOSIS — H6091 Unspecified otitis externa, right ear: Secondary | ICD-10-CM | POA: Diagnosis not present

## 2020-05-25 DIAGNOSIS — I10 Essential (primary) hypertension: Secondary | ICD-10-CM | POA: Diagnosis not present

## 2020-05-25 DIAGNOSIS — Z6826 Body mass index (BMI) 26.0-26.9, adult: Secondary | ICD-10-CM | POA: Diagnosis not present

## 2020-05-25 DIAGNOSIS — M810 Age-related osteoporosis without current pathological fracture: Secondary | ICD-10-CM | POA: Diagnosis not present

## 2020-06-02 ENCOUNTER — Ambulatory Visit (HOSPITAL_COMMUNITY)
Admission: RE | Admit: 2020-06-02 | Discharge: 2020-06-02 | Disposition: A | Discharge status: Home / Self Care | Payer: Medicare HMO | Source: Ambulatory Visit | Attending: Physician Assistant | Admitting: Physician Assistant

## 2020-06-02 ENCOUNTER — Other Ambulatory Visit: Payer: Self-pay

## 2020-06-02 DIAGNOSIS — E2839 Other primary ovarian failure: Secondary | ICD-10-CM | POA: Insufficient documentation

## 2020-06-02 DIAGNOSIS — M810 Age-related osteoporosis without current pathological fracture: Secondary | ICD-10-CM | POA: Diagnosis not present

## 2020-06-08 DIAGNOSIS — E7849 Other hyperlipidemia: Secondary | ICD-10-CM | POA: Diagnosis not present

## 2020-06-08 DIAGNOSIS — I10 Essential (primary) hypertension: Secondary | ICD-10-CM | POA: Diagnosis not present

## 2020-06-23 DIAGNOSIS — E538 Deficiency of other specified B group vitamins: Secondary | ICD-10-CM | POA: Diagnosis not present

## 2020-07-13 ENCOUNTER — Other Ambulatory Visit: Payer: Self-pay | Admitting: Internal Medicine

## 2020-07-13 ENCOUNTER — Encounter (HOSPITAL_COMMUNITY): Payer: Self-pay | Admitting: Radiology

## 2020-07-13 ENCOUNTER — Ambulatory Visit (HOSPITAL_COMMUNITY)
Admission: RE | Admit: 2020-07-13 | Discharge: 2020-07-13 | Disposition: A | Discharge status: Home / Self Care | Payer: Medicare HMO | Source: Ambulatory Visit | Attending: Internal Medicine | Admitting: Internal Medicine

## 2020-07-13 DIAGNOSIS — I10 Essential (primary) hypertension: Secondary | ICD-10-CM | POA: Diagnosis not present

## 2020-07-13 DIAGNOSIS — R109 Unspecified abdominal pain: Secondary | ICD-10-CM | POA: Insufficient documentation

## 2020-07-13 DIAGNOSIS — Z6826 Body mass index (BMI) 26.0-26.9, adult: Secondary | ICD-10-CM | POA: Diagnosis not present

## 2020-07-13 DIAGNOSIS — E663 Overweight: Secondary | ICD-10-CM | POA: Diagnosis not present

## 2020-07-13 LAB — POCT I-STAT CREATININE: Creatinine, Ser: 0.6 mg/dL (ref 0.44–1.00)

## 2020-07-13 MED ORDER — IOHEXOL 300 MG/ML  SOLN
100.0000 mL | Freq: Once | INTRAMUSCULAR | Status: AC | PRN
  Administered 2020-07-13: 100 mL via INTRAVENOUS

## 2020-07-15 DIAGNOSIS — Z6826 Body mass index (BMI) 26.0-26.9, adult: Secondary | ICD-10-CM | POA: Diagnosis not present

## 2020-07-15 DIAGNOSIS — E663 Overweight: Secondary | ICD-10-CM | POA: Diagnosis not present

## 2020-07-15 DIAGNOSIS — K5792 Diverticulitis of intestine, part unspecified, without perforation or abscess without bleeding: Secondary | ICD-10-CM | POA: Diagnosis not present

## 2020-08-04 DIAGNOSIS — H9201 Otalgia, right ear: Secondary | ICD-10-CM | POA: Diagnosis not present

## 2020-08-04 DIAGNOSIS — H6123 Impacted cerumen, bilateral: Secondary | ICD-10-CM | POA: Diagnosis not present

## 2020-08-09 DIAGNOSIS — E7849 Other hyperlipidemia: Secondary | ICD-10-CM | POA: Diagnosis not present

## 2020-08-09 DIAGNOSIS — I10 Essential (primary) hypertension: Secondary | ICD-10-CM | POA: Diagnosis not present

## 2020-08-25 DIAGNOSIS — E538 Deficiency of other specified B group vitamins: Secondary | ICD-10-CM | POA: Diagnosis not present

## 2020-09-05 DIAGNOSIS — Z85820 Personal history of malignant melanoma of skin: Secondary | ICD-10-CM | POA: Diagnosis not present

## 2020-09-05 DIAGNOSIS — L570 Actinic keratosis: Secondary | ICD-10-CM | POA: Diagnosis not present

## 2020-09-05 DIAGNOSIS — Z1283 Encounter for screening for malignant neoplasm of skin: Secondary | ICD-10-CM | POA: Diagnosis not present

## 2020-09-05 DIAGNOSIS — L814 Other melanin hyperpigmentation: Secondary | ICD-10-CM | POA: Diagnosis not present

## 2020-09-05 DIAGNOSIS — Z85828 Personal history of other malignant neoplasm of skin: Secondary | ICD-10-CM | POA: Diagnosis not present

## 2020-09-15 IMAGING — MG DIGITAL SCREENING BILAT W/ TOMO W/ CAD
8 series · 9 of 24 positions shown · non-contrast
Comparison: Previous exam(s).

CLINICAL DATA: Screening.

EXAM:
DIGITAL SCREENING BILATERAL MAMMOGRAM WITH TOMO AND CAD

[L MLO synth-2D]
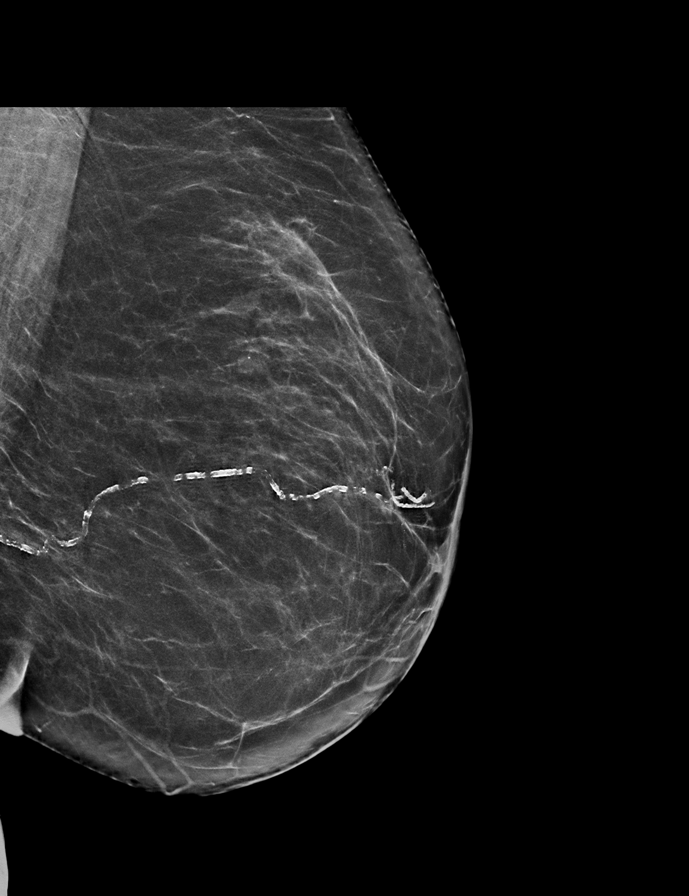

[R CC synth-2D]
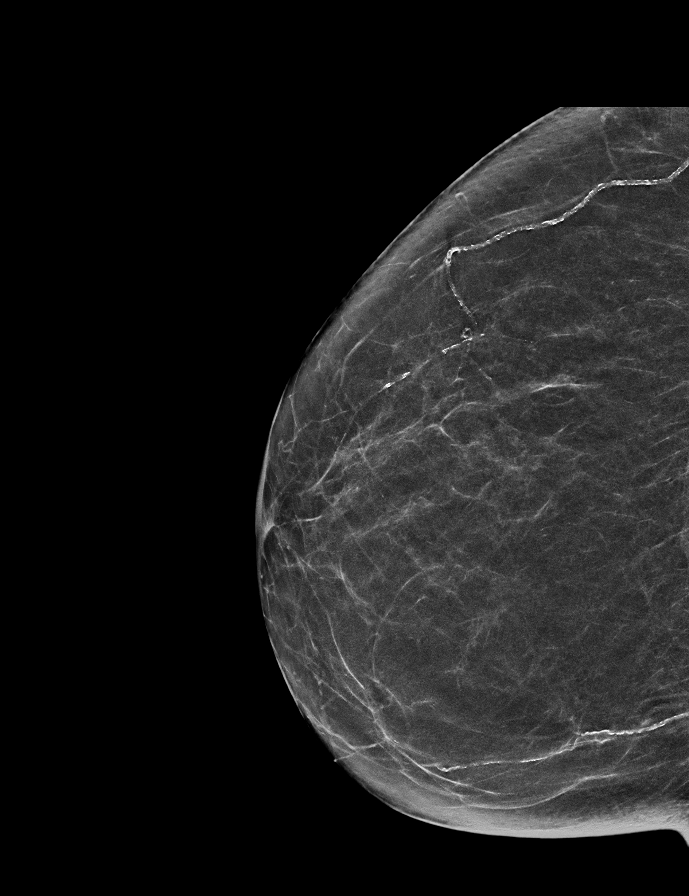

[L CC synth-2D]
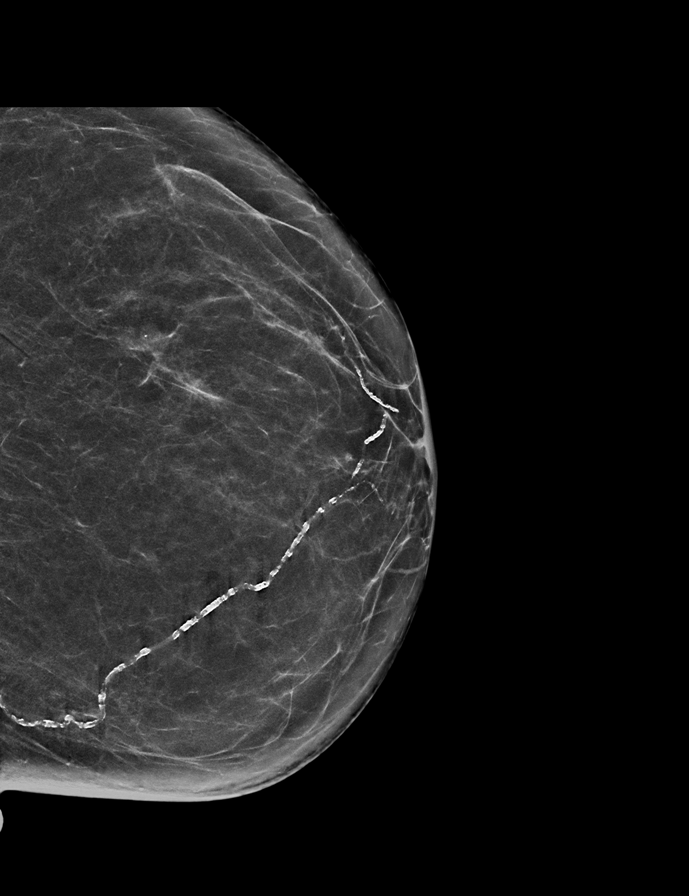

[R MLO synth-2D]
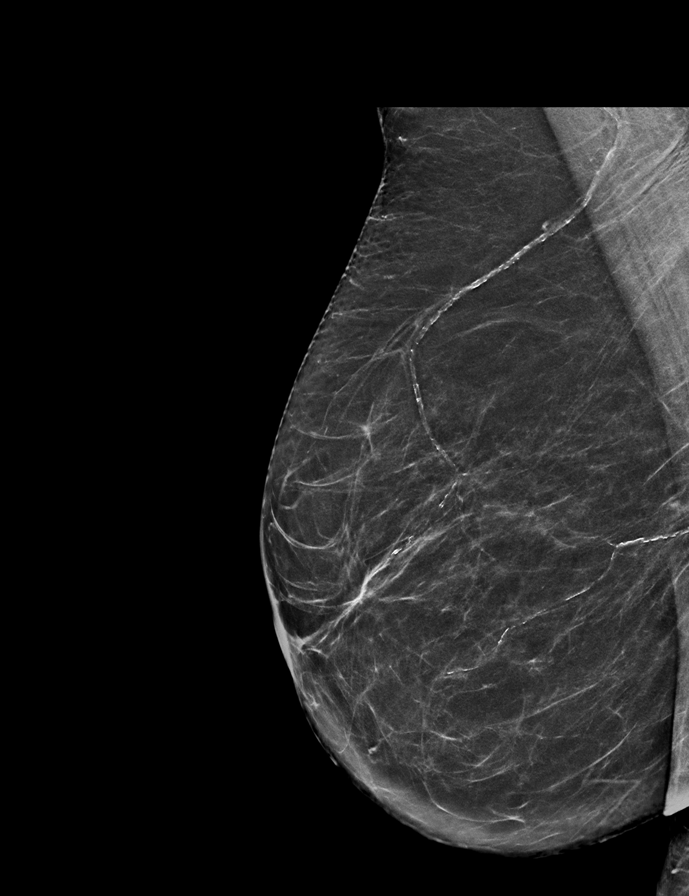

[R MLO tomo · 2 of 64 frames shown]
[frame 21/64]
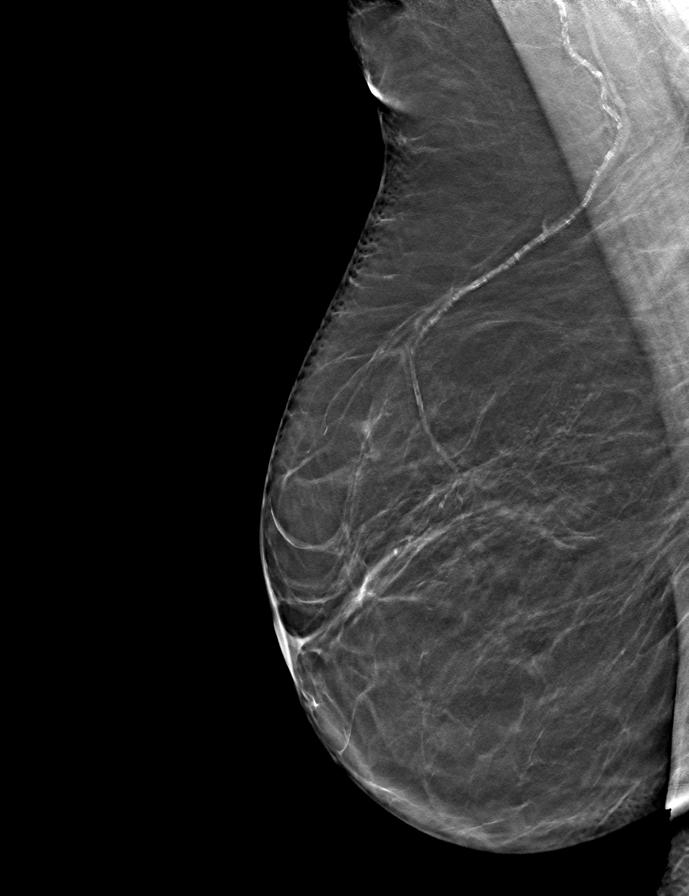
[frame 33/64]
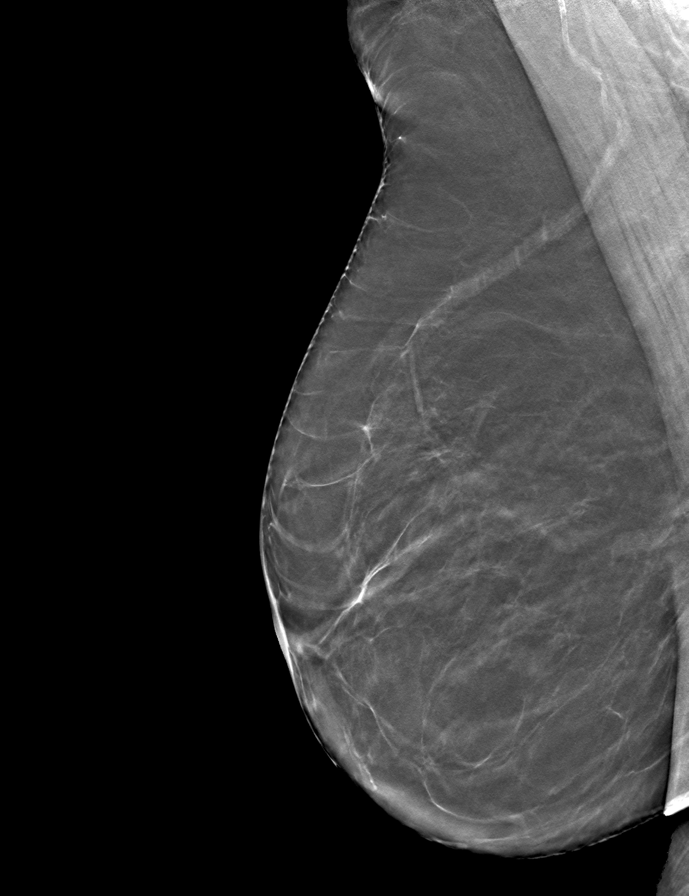

[L MLO tomo · tomo slice 35/68.0]
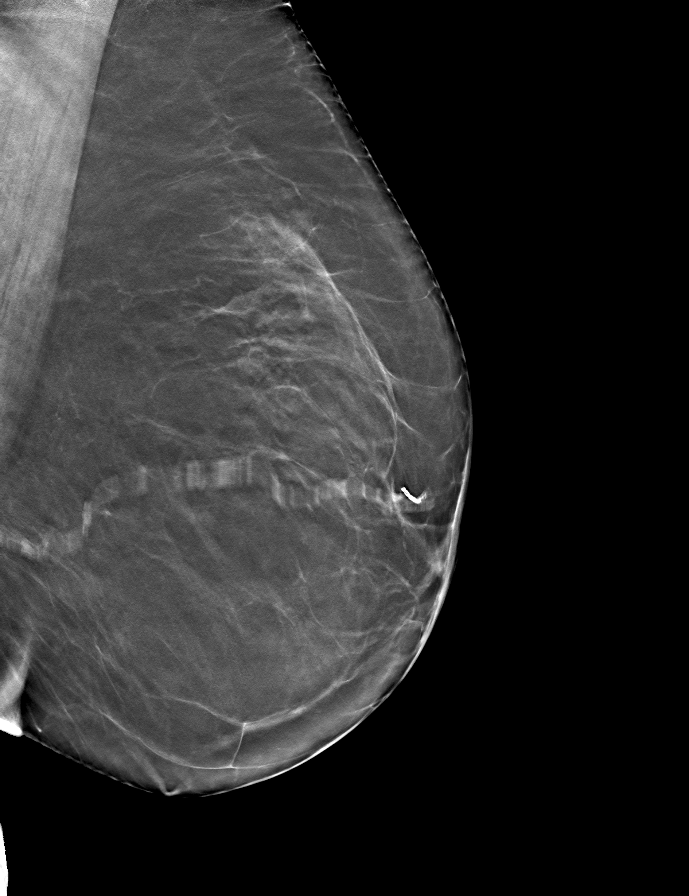

[L CC tomo · tomo slice 32/63.0]
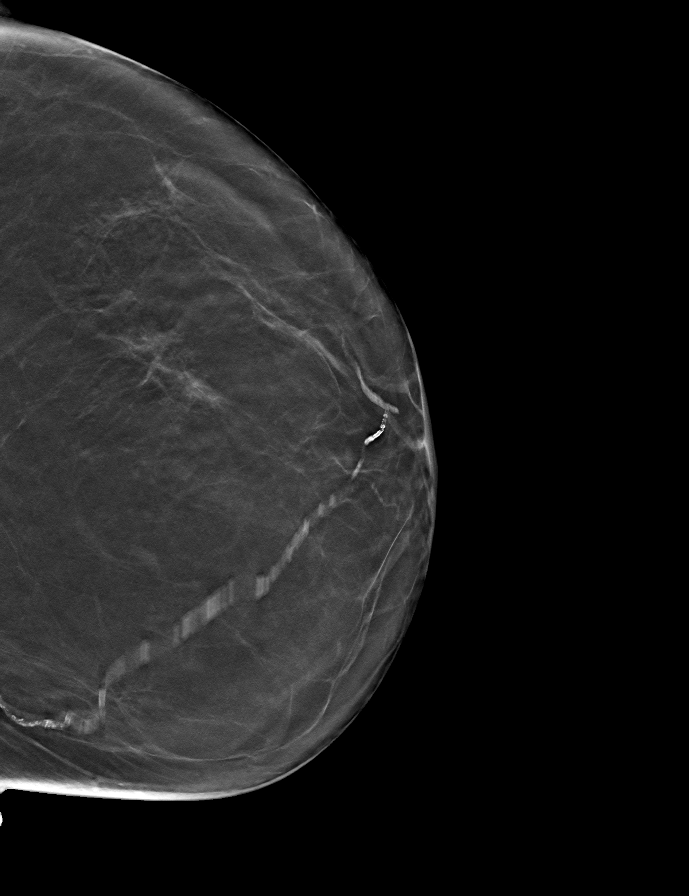

[R CC tomo · tomo slice 29/58.0]
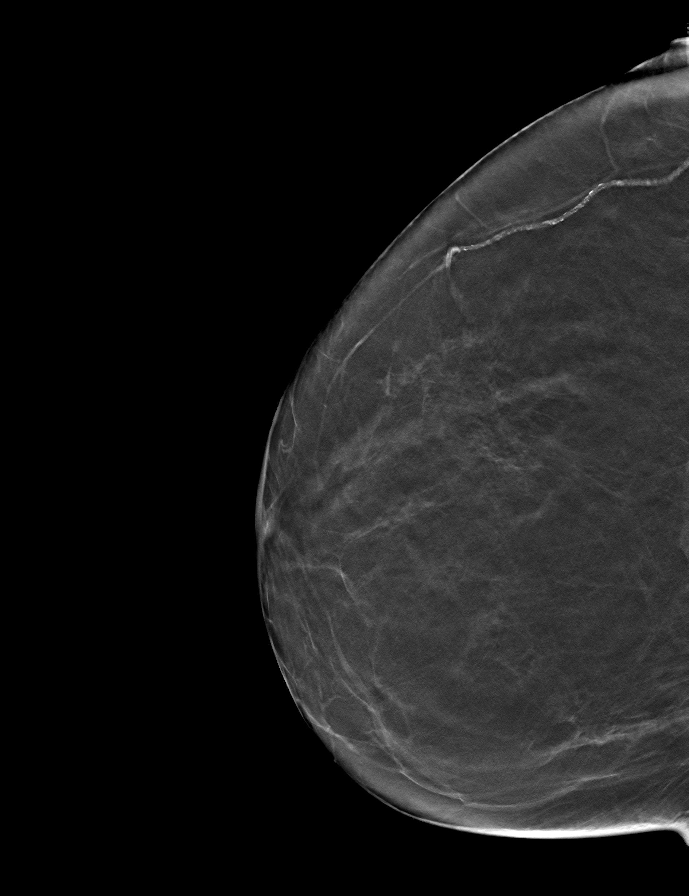

[9 of 24 positions shown; findings below may reference images not displayed]

ACR Breast Density Category b: There are scattered areas of
fibroglandular density.
FINDINGS: There are no findings suspicious for malignancy. Images were
processed with CAD.
IMPRESSION: No mammographic evidence of malignancy. A result letter of this
screening mammogram will be mailed directly to the patient.

RECOMMENDATION:
Screening mammogram in one year. (Code:CN-U-775)

BI-RADS CATEGORY  1: Negative.

## 2020-09-26 DIAGNOSIS — E538 Deficiency of other specified B group vitamins: Secondary | ICD-10-CM | POA: Diagnosis not present

## 2020-10-09 DIAGNOSIS — E782 Mixed hyperlipidemia: Secondary | ICD-10-CM | POA: Diagnosis not present

## 2020-10-09 DIAGNOSIS — I10 Essential (primary) hypertension: Secondary | ICD-10-CM | POA: Diagnosis not present

## 2020-10-18 ENCOUNTER — Other Ambulatory Visit (HOSPITAL_COMMUNITY): Payer: Medicare HMO

## 2020-10-18 ENCOUNTER — Inpatient Hospital Stay (HOSPITAL_COMMUNITY): Payer: Medicare HMO | Attending: Hematology

## 2020-10-18 ENCOUNTER — Other Ambulatory Visit: Payer: Self-pay

## 2020-10-18 DIAGNOSIS — D509 Iron deficiency anemia, unspecified: Secondary | ICD-10-CM | POA: Insufficient documentation

## 2020-10-18 DIAGNOSIS — D649 Anemia, unspecified: Secondary | ICD-10-CM

## 2020-10-18 LAB — COMPREHENSIVE METABOLIC PANEL
ALT: 13 U/L (ref 0–44)
AST: 20 U/L (ref 15–41)
Albumin: 4 g/dL (ref 3.5–5.0)
Alkaline Phosphatase: 68 U/L (ref 38–126)
Anion gap: 8 (ref 5–15)
BUN: 18 mg/dL (ref 8–23)
CO2: 30 mmol/L (ref 22–32)
Calcium: 9.6 mg/dL (ref 8.9–10.3)
Chloride: 103 mmol/L (ref 98–111)
Creatinine, Ser: 0.78 mg/dL (ref 0.44–1.00)
GFR, Estimated: >60 mL/min (ref >60)
Glucose, Bld: 121 mg/dL — ABNORMAL HIGH (ref 70–99)
Potassium: 3.8 mmol/L (ref 3.5–5.1)
Sodium: 141 mmol/L (ref 135–145)
Total Bilirubin: 0.7 mg/dL (ref 0.3–1.2)
Total Protein: 7 g/dL (ref 6.5–8.1)

## 2020-10-18 LAB — CBC WITH DIFFERENTIAL/PLATELET
Abs Immature Granulocytes: 0.02 10*3/uL (ref 0.00–0.07)
Basophils Absolute: 0 10*3/uL (ref 0.0–0.1)
Basophils Relative: 1 %
Eosinophils Absolute: 0.2 10*3/uL (ref 0.0–0.5)
Eosinophils Relative: 4 %
HCT: 35.6 % — ABNORMAL LOW (ref 36.0–46.0)
Hemoglobin: 11.4 g/dL — ABNORMAL LOW (ref 12.0–15.0)
Immature Granulocytes: 0 %
Lymphocytes Relative: 35 %
Lymphs Abs: 1.8 10*3/uL (ref 0.7–4.0)
MCH: 29.8 pg (ref 26.0–34.0)
MCHC: 32 g/dL (ref 30.0–36.0)
MCV: 93 fL (ref 80.0–100.0)
Monocytes Absolute: 0.6 10*3/uL (ref 0.1–1.0)
Monocytes Relative: 11 %
Neutro Abs: 2.6 10*3/uL (ref 1.7–7.7)
Neutrophils Relative %: 49 %
Platelets: 185 10*3/uL (ref 150–400)
RBC: 3.83 MIL/uL — ABNORMAL LOW (ref 3.87–5.11)
RDW: 14 % (ref 11.5–15.5)
WBC: 5.2 10*3/uL (ref 4.0–10.5)
nRBC: 0 % (ref 0.0–0.2)

## 2020-10-18 LAB — VITAMIN D 25 HYDROXY (VIT D DEFICIENCY, FRACTURES): Vit D, 25-Hydroxy: 81.32 ng/mL (ref 30–100)

## 2020-10-18 LAB — VITAMIN B12: Vitamin B-12: 527 pg/mL (ref 180–914)

## 2020-10-18 LAB — IRON AND TIBC
Iron: 75 ug/dL (ref 28–170)
Saturation Ratios: 17 % (ref 10.4–31.8)
TIBC: 439 ug/dL (ref 250–450)
UIBC: 364 ug/dL

## 2020-10-18 LAB — LACTATE DEHYDROGENASE: LDH: 147 U/L (ref 98–192)

## 2020-10-18 LAB — FERRITIN: Ferritin: 17 ng/mL (ref 11–307)

## 2020-10-20 NOTE — Progress Notes (Signed)
[place], [place] [address]   CLINIC:  Medical Oncology/Hematology  PCP:  [facility], [place] [id number] [phone]   REASON FOR VISIT:  Follow-up for normocytic anemia  CURRENT THERAPY: Oral iron tablets and intermittent Feraheme (last on 06/10/2019)   INTERVAL HISTORY:  Sara Fischer is a delightful 80 y.o. female who returns for routine follow-up of her normocytic anemia.  She was last seen by Dr. Delton Coombes on 04/20/2020.  At today's visit, she reports feeling well.  No recent hospitalizations, surgeries, or changes in baseline health status.  She denies any source of blood loss such as epistaxis, hematemesis, hematochezia, or melena.  She has been taking her iron pill nightly along with stool softener, and has tolerated it well.  She denies any fatigue.  Reports that her energy levels are 100%, and she continues to enjoy going dancing at least 4 nights per week.  No chest pain, dyspnea on exertion, palpitations, syncope.  No B symptoms of fever, chills, night sweats, unintentional weight loss.  She has 100% energy and 100% appetite. She endorses that she is maintaining a stable weight.    REVIEW OF SYSTEMS:  Review of Systems  Constitutional:  Negative for appetite change, chills, diaphoresis, fatigue, fever and unexpected weight change.  HENT:   Negative for lump/mass and nosebleeds.   Eyes:  Negative for eye problems.  Respiratory:  Negative for cough, hemoptysis and shortness of breath.   Cardiovascular:  Negative for chest pain, leg swelling and palpitations.  Gastrointestinal:  Negative for abdominal pain, blood in stool, constipation, diarrhea, nausea and vomiting.  Genitourinary:  Negative for hematuria.   Skin: Negative.   Neurological:  Negative for dizziness, headaches and light-headedness.  Hematological:  Does not bruise/bleed easily.     PAST MEDICAL/SURGICAL HISTORY:  Past Medical  History:  Diagnosis Date   High cholesterol    Hypertension    Past Surgical History:  Procedure Laterality Date   ABDOMINAL HYSTERECTOMY     APPENDECTOMY     COLONOSCOPY  2010   Dr. Gala Romney: pancolonic diverticulosis, sigmoid colon tubular adenoma removed.    COLONOSCOPY N/A 02/29/2016   Procedure: COLONOSCOPY;  Surgeon: Daneil Dolin, MD;  [facility];  Service: Endoscopy;  Laterality: N/A;  [place] N/A 12/28/2016   Procedure: HERNIA REPAIR INCISIONAL;  Surgeon: Aviva Signs, MD;  Location: [facility];  Service: General;  Laterality: N/A;   KNEE ARTHROSCOPY     bilateral   LAPAROSCOPIC BILATERAL SALPINGO OOPHERECTOMY  2001   PARTIAL HYSTERECTOMY  1979     SOCIAL HISTORY:  Social History   Socioeconomic History   Marital status: Widowed    Spouse name: Not on file   Number of children: 4   Years of education: Not on file   Highest education level: Not on file  Occupational History   Occupation: Retired  Tobacco Use   Smoking status: Former    Packs/day: 0.50    Years: 5.00    Pack years: 2.50    Types: Cigarettes    Start date: 08/28/1963    Quit date: 11/02/1968    Years since quitting: 52.0   Smokeless tobacco: Never  Vaping Use   Vaping Use: Never used  Substance and Sexual Activity   Alcohol use: Yes    Alcohol/week: 0.0 standard drinks    Comment: wine occas.   Drug use: No   Sexual activity: Not on file  Other Topics Concern   Not on file  Social History Narrative   Right handed    Caffeine use: 1 cup coffee every morning   1 soda per day   Social Determinants of Health   Financial Resource Strain: Not on file  Food Insecurity: Not on file  Transportation Needs: Not on file  Physical Activity: Not on file  Stress: Not on file  Social Connections: Not on file  Intimate Partner Violence: Not on file    FAMILY HISTORY:  Family History  Problem Relation Age of Onset   Diabetes Mother    Heart disease Mother     Heart disease Father 27   Dementia Sister    Heart disease Brother    Diabetes Sister    Heart disease Sister    Heart disease Sister    Diabetes Sister    Arthritis Sister    Heart disease Sister    Heart disease Brother    Healthy Son    Healthy Son    Colon cancer Neg Hx     CURRENT MEDICATIONS:  Outpatient Encounter Medications as of 10/21/2020  Medication Sig   aspirin EC 81 MG tablet Take 81 mg by mouth daily.   calcium carbonate (OS-CAL - DOSED IN MG OF ELEMENTAL CALCIUM) 1250 (500 Ca) MG tablet Take 1 tablet by mouth every other day.    Cholecalciferol (D3-1000 PO) Take 2,000 capsules by mouth daily.    ferrous sulfate 325 (65 FE) MG tablet Take 325 mg by mouth at bedtime.   fluticasone (FLONASE) 50 MCG/ACT nasal spray Place 1 spray into both nostrils daily as needed for allergies or rhinitis.   furosemide (LASIX) 40 MG tablet    ketoconazole (NIZORAL) 2 % cream Apply 1 application topically 2 (two) times daily as needed.    metoprolol succinate (TOPROL-XL) 100 MG 24 hr tablet Take 100 mg by mouth daily.   Oxcarbazepine (TRILEPTAL) 300 MG tablet TAKE 1 TABLET BY MOUTH EVERY MORNING AND 2 TABLETS EVERY NIGHT AT BEDTIME   Propylene Glycol 0.6 % SOLN Place 1 drop into both eyes 2 (two) times daily as needed (dry eyes).   simvastatin (ZOCOR) 20 MG tablet Take 20 mg by mouth at bedtime.    traMADol (ULTRAM) 50 MG tablet Take 50 mg by mouth every 12 (twelve) hours as needed.    No facility-administered encounter medications on file as of 10/21/2020.    ALLERGIES:  Allergies  Allergen Reactions   Feldene [Piroxicam]     Headaches    Lodine [Etodolac]     headaches     PHYSICAL EXAM:  ECOG PERFORMANCE STATUS: 0 - Asymptomatic  There were no vitals filed for this visit. There were no vitals filed for this visit. Physical Exam Constitutional:      Appearance: Normal appearance.  HENT:     Head: Normocephalic and atraumatic.     Mouth/Throat:     Mouth: Mucous  membranes are moist.  Eyes:     Extraocular Movements: Extraocular movements intact.     Pupils: Pupils are equal, round, and reactive to light.  Cardiovascular:     Rate and Rhythm: Normal rate and regular rhythm.     Pulses: Normal pulses.     Heart sounds: Normal heart sounds.  Pulmonary:     Effort: Pulmonary effort is normal.     Breath sounds: Normal breath sounds.  Abdominal:     General: Bowel sounds are normal.     Palpations: Abdomen is soft.  Tenderness: There is no abdominal tenderness.  Musculoskeletal:        General: No swelling.     Right lower leg: No edema.     Left lower leg: No edema.  Lymphadenopathy:     Cervical: No cervical adenopathy.  Skin:    General: Skin is warm and dry.  Neurological:     General: No focal deficit present.     Mental Status: She is alert and oriented to person, place, and time.  Psychiatric:        Mood and Affect: Mood normal.        Behavior: Behavior normal.     LABORATORY DATA:  I have reviewed the labs as listed.  CBC    Component Value Date/Time   WBC 5.2 10/18/2020 0918   RBC 3.83 (L) 10/18/2020 0918   HGB 11.4 (L) 10/18/2020 0918   HCT 35.6 (L) 10/18/2020 0918   PLT 185 10/18/2020 0918   MCV 93.0 10/18/2020 0918   MCH 29.8 10/18/2020 0918   MCHC 32.0 10/18/2020 0918   RDW 14.0 10/18/2020 0918   LYMPHSABS 1.8 10/18/2020 0918   MONOABS 0.6 10/18/2020 0918   EOSABS 0.2 10/18/2020 0918   BASOSABS 0.0 10/18/2020 0918   CMP Latest Ref Rng & Units 10/18/2020 07/13/2020 04/13/2020  Glucose 70 - 99 mg/dL 121(H) - 109(H)  BUN 8 - 23 mg/dL 18 - 18  Creatinine 0.44 - 1.00 mg/dL 0.78 0.60 0.75  Sodium 135 - 145 mmol/L 141 - 141  Potassium 3.5 - 5.1 mmol/L 3.8 - 3.4(L)  Chloride 98 - 111 mmol/L 103 - 102  CO2 22 - 32 mmol/L 30 - 32  Calcium 8.9 - 10.3 mg/dL 9.6 - 9.9  Total Protein 6.5 - 8.1 g/dL 7.0 - 7.2  Total Bilirubin 0.3 - 1.2 mg/dL 0.7 - 0.6  Alkaline Phos 38 - 126 U/L 68 - 69  AST 15 - 41 U/L 20 - 19  ALT 0  - 44 U/L 13 - 13    DIAGNOSTIC IMAGING:  I have independently reviewed the relevant imaging and discussed with the patient.  ASSESSMENT & PLAN: 1.  Normocytic anemia with iron deficiency - Referred by primary care provider (Dr. Gerarda Fraction) in March 2022 - CBC on 04/28/2019 at Dr. Nolon Rod office showed hemoglobin 9.5 MCV of 89. White count was slightly low at 3.4 and platelets were 229. Differential was normal ferritin was 15 and normal 123456 and folic acid. Percent saturation was 9.0, reticulocyte was 2% - Colonoscopy on 02/29/2016 showed many small and large mouth diverticula in the entire colon. Grade 1 internal hemorrhoids. - No bright red blood per rectum or melena - SPEP negative, LDH normal, stool occult blood normal. - Previously tried oral iron but could not tolerate it due to constipation- has been tolerating it better now that she takes stool softener - She received 2 Feraheme was on 06/03/2019 and 06/10/2019. - Most recent labs (10/18/2020) Hgb 11.4 with MCV 93.0, CMP unremarkable, ferritin 17 with 17% iron saturation; LDH normal; B12 and vitamin D normal - Hemoglobin and ferritin failed to improve on oral iron segmentation, in fact both trended downwards; suspect that the patient may have some element of malabsorption - PLAN: Recommend IV iron with Venofer x3 (300-300-400).  We will discontinue iron tablet, as her iron levels failed to improve.  RTC in 6 months for repeat labs and office visit.   PLAN SUMMARY & DISPOSITION: -IV Venofer x3 (300-300-400) - RTC in 6 months for repeat labs  and office visit  All questions were answered. The patient knows to call the clinic with any problems, questions or concerns.  Medical decision making: Low  Time spent on visit: I spent 15 minutes counseling the patient face to face. The total time spent in the appointment was 25 minutes and more than 50% was on counseling.   Harriett Rush, PA-C  10/21/2020 12:58 PM

## 2020-10-21 ENCOUNTER — Other Ambulatory Visit: Payer: Self-pay

## 2020-10-21 ENCOUNTER — Inpatient Hospital Stay (HOSPITAL_COMMUNITY): Payer: Medicare HMO | Admitting: Physician Assistant

## 2020-10-21 VITALS — BP 149/67 | HR 73 | Temp 98.4°F | Resp 18 | Wt 149.9 lb

## 2020-10-21 DIAGNOSIS — D649 Anemia, unspecified: Secondary | ICD-10-CM | POA: Diagnosis not present

## 2020-10-21 DIAGNOSIS — D509 Iron deficiency anemia, unspecified: Secondary | ICD-10-CM | POA: Diagnosis not present

## 2020-10-21 NOTE — Patient Instructions (Signed)
[place] at [facility] Discharge Instructions  You were seen today by [name] PA-C for your iron deficiency anemia.  Despite taking your iron pill every day, your iron stores have actually gotten lower instead of higher.  You are likely not absorbing the iron very well from your stomach, and therefore you can stop taking the iron pill if you would like.  Instead, we will give you IV iron infusions, with your first dose to be given in about 2 weeks.  You will receive 3 doses of IV iron.  We will check you again in 6 months with labs and office visit at that time.  LABS: Return in 6 months for repeat labs  OTHER TESTS: None  MEDICATIONS: No changes to home medications  FOLLOW-UP APPOINTMENT: Office visit in 6 months   Thank you for choosing [place] at [facility] to provide your oncology and hematology care.  To afford each patient quality time with our provider, please arrive at least 15 minutes before your scheduled appointment time.   If you have a lab appointment with the [facility] please come in thru the Main Entrance and check in at the main information desk.  You need to re-schedule your appointment should you arrive 10 or more minutes late.  We strive to give you quality time with our providers, and arriving late affects you and other patients whose appointments are after yours.  Also, if you no show three or more times for appointments you may be dismissed from the clinic at the providers discretion.     Again, thank you for choosing [facility].  Our hope is that these requests will decrease the amount of time that you wait before being seen by our physicians.       _____________________________________________________________  Should you have questions after your visit to [facility], please contact our office at [phone] and follow the prompts.  Our office hours are 8:00 a.m. and 4:30  p.m. Monday - Friday.  Please note that voicemails left after 4:00 p.m. may not be returned until the following business day.  We are closed weekends and major holidays.  You do have access to a nurse 24-7, just call the main number to the clinic [phone] and do not press any options, hold on the line and a nurse will answer the phone.    For prescription refill requests, have your pharmacy contact our office and allow 72 hours.    Due to Covid, you will need to wear a mask upon entering the hospital. If you do not have a mask, a mask will be given to you at the Main Entrance upon arrival. For doctor visits, patients may have 1 support person age 42 or older with them. For treatment visits, patients can not have anyone with them due to social distancing guidelines and our immunocompromised population.

## 2020-10-24 DIAGNOSIS — E538 Deficiency of other specified B group vitamins: Secondary | ICD-10-CM | POA: Diagnosis not present

## 2020-10-25 ENCOUNTER — Ambulatory Visit (HOSPITAL_COMMUNITY): Payer: Medicare HMO | Admitting: Physician Assistant

## 2020-10-25 ENCOUNTER — Ambulatory Visit (HOSPITAL_COMMUNITY): Payer: Medicare HMO | Admitting: Hematology

## 2020-10-29 ENCOUNTER — Other Ambulatory Visit: Payer: Self-pay

## 2020-10-29 ENCOUNTER — Encounter: Payer: Self-pay | Admitting: Emergency Medicine

## 2020-10-29 ENCOUNTER — Ambulatory Visit
Admission: EM | Admit: 2020-10-29 | Discharge: 2020-10-29 | Disposition: A | Discharge status: Home / Self Care | Payer: Medicare HMO | Attending: Internal Medicine | Admitting: Internal Medicine

## 2020-10-29 DIAGNOSIS — M26621 Arthralgia of right temporomandibular joint: Secondary | ICD-10-CM

## 2020-10-29 NOTE — ED Provider Notes (Signed)
[facility]    CSN: [id number] Arrival date & time: 10/29/20  1052      History   Chief Complaint No chief complaint on file.   HPI Sara Fischer is a 80 y.o. female comes to the urgent care with left facial pain of 1 day duration.  Onset was sudden and is currently severe.  Pain is aggravated by opening her mouth.  It is partially relieved with Aleve.  Patient denies any trauma to the face.  She has a clicking sensation on the side of the jaw at the TMJ.  She has had this episode once in the past but it resolved spontaneously.  No falls or trauma.  No swelling over the left side of the face.   HPI  Past Medical History:  Diagnosis Date   High cholesterol    Hypertension     Patient Active Problem List   Diagnosis Date Noted   Normocytic anemia 05/26/2019   Facial pain 08/07/2018   Incisional hernia with obstruction but no gangrene    SBO (small bowel obstruction) ([place]) 12/26/2016   Hypertension 12/26/2016   Abdominal wall hernia    Heme positive stool 02/08/2016    Past Surgical History:  Procedure Laterality Date   ABDOMINAL HYSTERECTOMY     APPENDECTOMY     COLONOSCOPY  2010   Dr. Gala Romney: pancolonic diverticulosis, sigmoid colon tubular adenoma removed.    COLONOSCOPY N/A 02/29/2016   Procedure: COLONOSCOPY;  Surgeon: Daneil Dolin, MD;  Location: [facility];  Service: Endoscopy;  Laterality: N/A;  [place] N/A 12/28/2016   Procedure: HERNIA REPAIR INCISIONAL;  Surgeon: Aviva Signs, MD;  Location: [facility];  Service: General;  Laterality: N/A;   KNEE ARTHROSCOPY     bilateral   LAPAROSCOPIC BILATERAL SALPINGO OOPHERECTOMY  2001   PARTIAL HYSTERECTOMY  1979    OB History   No obstetric history on file.      Home Medications    Prior to Admission medications   Medication Sig Start Date End Date Taking? Authorizing Provider  aspirin EC 81 MG tablet Take 81 mg by mouth daily.    [provider]  calcium  carbonate (OS-CAL - DOSED IN MG OF ELEMENTAL CALCIUM) 1250 (500 Ca) MG tablet Take 1 tablet by mouth every other day.     [provider]  Cholecalciferol (D3-1000 PO) Take 1 capsule by mouth daily.    [provider]  ferrous sulfate 325 (65 FE) MG tablet Take 325 mg by mouth at bedtime. Taking every other day    [provider]  fluticasone (FLONASE) 50 MCG/ACT nasal spray Place 1 spray into both nostrils daily as needed for allergies or rhinitis.    [provider]  furosemide (LASIX) 40 MG tablet  12/08/19   [provider]  ketoconazole (NIZORAL) 2 % cream Apply 1 application topically 2 (two) times daily as needed.  06/04/19   [provider]  Menaquinone-7 (VITAMIN K2 PO) Take 1 tablet by mouth daily.    [provider]  metoprolol succinate (TOPROL-XL) 100 MG 24 hr tablet Take 100 mg by mouth daily. 09/10/17   [provider]  Oxcarbazepine (TRILEPTAL) 300 MG tablet TAKE 1 TABLET BY MOUTH EVERY MORNING AND 2 TABLETS EVERY NIGHT AT BEDTIME 12/14/19   Lomax, Amy, NP  Propylene Glycol 0.6 % SOLN Place 1 drop into both eyes 2 (two) times daily as needed (dry eyes).    [provider]  simvastatin (ZOCOR) 20 MG tablet Take 20 mg by mouth at bedtime.     [provider]  traMADol (ULTRAM) 50 MG tablet Take 50 mg by mouth every 12 (twelve) hours as needed.  05/07/19   [provider]    Family History Family History  Problem Relation Age of Onset   Diabetes Mother    Heart disease Mother    Heart disease Father 57   Dementia Sister    Heart disease Brother    Diabetes Sister    Heart disease Sister    Heart disease Sister    Diabetes Sister    Arthritis Sister    Heart disease Sister    Heart disease Brother    Healthy Son    Healthy Son    Colon cancer Neg Hx     Social History Social History   Tobacco Use   Smoking status: Former    Packs/day: 0.50    Years: 5.00    Pack years:  2.50    Types: Cigarettes    Start date: 08/28/1963    Quit date: 11/02/1968    Years since quitting: 52.0   Smokeless tobacco: Never  Vaping Use   Vaping Use: Never used  Substance Use Topics   Alcohol use: Yes    Alcohol/week: 0.0 standard drinks    Comment: wine occas.   Drug use: No     Allergies   Feldene [piroxicam] and Lodine [etodolac]   Review of Systems Review of Systems  Gastrointestinal: Negative.   Musculoskeletal:  Positive for arthralgias. Negative for joint swelling, myalgias, neck pain and neck stiffness.  Neurological: Negative.     Physical Exam Triage Vital Signs ED Triage Vitals  Enc Vitals Group     BP 10/29/20 1349 (!) 173/87     Pulse Rate 10/29/20 1349 82     Resp 10/29/20 1349 16     Temp 10/29/20 1349 (!) 97.4 F (36.3 C)     Temp Source 10/29/20 1349 Tympanic     SpO2 10/29/20 1349 98 %     Weight --      Height --      Head Circumference --      Peak Flow --      Pain Score 10/29/20 1351 5     Pain Loc --      Pain Edu? --      Excl. in [place]? --    No data found.  Updated Vital Signs BP (!) 173/87 (BP Location: Right Arm)   Pulse 82   Temp (!) 97.4 F (36.3 C) (Tympanic)   Resp 16   SpO2 98%   Visual Acuity Right Eye Distance:   Left Eye Distance:   Bilateral Distance:    Right Eye Near:   Left Eye Near:    Bilateral Near:     Physical Exam Vitals and nursing note reviewed.  Constitutional:      General: She is in acute distress.     Appearance: She is not ill-appearing.  HENT:     Right Ear: Tympanic membrane normal.     Left Ear: Tympanic membrane normal.     Ears:     Comments: Tenderness over the right TMJ.  No swelling on the left side of the face. Cardiovascular:     Rate and Rhythm: Normal rate and regular rhythm.     Pulses: Normal pulses.     Heart sounds: Normal heart sounds.  Neurological:  Mental Status: She is alert.     UC Treatments / Results  Labs (all labs ordered are listed, but only  abnormal results are displayed) Labs Reviewed - No data to display  EKG   Radiology No results found.  Procedures Procedures (including critical care time)  Medications Ordered in UC Medications - No data to display  Initial Impression / Assessment and Plan / UC Course  I have reviewed the triage vital signs and the nursing notes.  Pertinent labs & imaging results that were available during my care of the patient were reviewed by me and considered in my medical decision making (see chart for details).     1.  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  Take Aleve as needed for pain Gentle range of motion of the TMJ joint Return to urgent care if symptoms worsen. Final Clinical Impressions(s) / UC Diagnoses   Final diagnoses:  Arthralgia of right temporomandibular joint     Discharge Instructions      Continue taking Aleve Icing of the right jaw with help Gentle range of motion of the TMJ Return to urgent care if symptoms worsen.   ED Prescriptions   None    PDMP not reviewed this encounter.   Chase Picket, MD 10/29/20 [phone]

## 2020-10-29 NOTE — ED Triage Notes (Signed)
Right ear and upper jaw pain since yesterday.  States it feels better to apply pressure to that area.

## 2020-10-29 NOTE — Discharge Instructions (Addendum)
Continue taking Aleve Icing of the right jaw with help Gentle range of motion of the TMJ Return to urgent care if symptoms worsen.

## 2020-11-07 ENCOUNTER — Inpatient Hospital Stay (HOSPITAL_COMMUNITY): Payer: Medicare HMO

## 2020-11-07 ENCOUNTER — Encounter (HOSPITAL_COMMUNITY): Payer: Self-pay

## 2020-11-07 ENCOUNTER — Other Ambulatory Visit: Payer: Self-pay

## 2020-11-07 VITALS — BP 131/67 | HR 58 | Temp 96.9°F | Resp 18

## 2020-11-07 DIAGNOSIS — D649 Anemia, unspecified: Secondary | ICD-10-CM

## 2020-11-07 DIAGNOSIS — D509 Iron deficiency anemia, unspecified: Secondary | ICD-10-CM | POA: Diagnosis not present

## 2020-11-07 MED ORDER — SODIUM CHLORIDE 0.9 % IV SOLN: Freq: Once | INTRAVENOUS | Status: AC

## 2020-11-07 MED ORDER — SODIUM CHLORIDE 0.9 % IV SOLN
300.0000 mg | Freq: Once | INTRAVENOUS | Status: AC
  Administered 2020-11-07: 300 mg via INTRAVENOUS
  Filled 2020-11-07: qty 300

## 2020-11-07 MED ORDER — LORATADINE 10 MG PO TABS
10.0000 mg | ORAL_TABLET | Freq: Once | ORAL | Status: AC
  Administered 2020-11-07: 10 mg via ORAL
  Filled 2020-11-07: qty 1

## 2020-11-07 MED ORDER — ACETAMINOPHEN 325 MG PO TABS
650.0000 mg | ORAL_TABLET | Freq: Once | ORAL | Status: AC
  Administered 2020-11-07: 650 mg via ORAL
  Filled 2020-11-07: qty 2

## 2020-11-07 NOTE — Progress Notes (Signed)
Patient tolerated iron infusion with no complaints voiced.  Peripheral IV site clean and dry with good blood return noted before and after infusion.  Band aid applied.  VSS with discharge and left in satisfactory condition with no s/s of distress noted.   

## 2020-11-07 NOTE — Patient Instructions (Signed)
[facility]  Discharge Instructions: Thank you for choosing [facility] to provide your oncology and hematology care.  If you have a lab appointment with the Cancer Center, please come in thru the Main Entrance and check in at the main information desk.  Wear comfortable clothing and clothing appropriate for easy access to any Portacath or PICC line.   We strive to give you quality time with your provider. You may need to reschedule your appointment if you arrive late (15 or more minutes).  Arriving late affects you and other patients whose appointments are after yours.  Also, if you miss three or more appointments without notifying the office, you may be dismissed from the clinic at the provider's discretion.      For prescription refill requests, have your pharmacy contact our office and allow 72 hours for refills to be completed.    Today you received the following: Venofer, return as scheduled.   To help prevent nausea and vomiting after your treatment, we encourage you to take your nausea medication as directed.  BELOW ARE SYMPTOMS THAT SHOULD BE REPORTED IMMEDIATELY: *FEVER GREATER THAN 100.4 F (38 C) OR HIGHER *CHILLS OR SWEATING *NAUSEA AND VOMITING THAT IS NOT CONTROLLED WITH YOUR NAUSEA MEDICATION *UNUSUAL SHORTNESS OF BREATH *UNUSUAL BRUISING OR BLEEDING *URINARY PROBLEMS (pain or burning when urinating, or frequent urination) *BOWEL PROBLEMS (unusual diarrhea, constipation, pain near the anus) TENDERNESS IN MOUTH AND THROAT WITH OR WITHOUT PRESENCE OF ULCERS (sore throat, sores in mouth, or a toothache) UNUSUAL RASH, SWELLING OR PAIN  UNUSUAL VAGINAL DISCHARGE OR ITCHING   Items with * indicate a potential emergency and should be followed up as soon as possible or go to the Emergency Department if any problems should occur.  Please show the CHEMOTHERAPY ALERT CARD or IMMUNOTHERAPY ALERT CARD at check-in to the Emergency Department and triage  nurse.  Should you have questions after your visit or need to cancel or reschedule your appointment, please contact [facility] [phone]  and follow the prompts.  Office hours are 8:00 a.m. to 4:30 p.m. Monday - Friday. Please note that voicemails left after 4:00 p.m. may not be returned until the following business day.  We are closed weekends and major holidays. You have access to a nurse at all times for urgent questions. Please call the main number to the clinic [phone] and follow the prompts.  For any non-urgent questions, you may also contact your provider using MyChart. We now offer e-Visits for anyone 18 and older to request care online for non-urgent symptoms. For details visit [email or web address].   Also download the MyChart app! Go to the app store, search "MyChart", open the app, select [facility], and log in with your MyChart username and password.  Due to Covid, a mask is required upon entering the hospital/clinic. If you do not have a mask, one will be given to you upon arrival. For doctor visits, patients may have 1 support person aged 18 or older with them. For treatment visits, patients cannot have anyone with them due to current Covid guidelines and our immunocompromised population.  

## 2020-11-11 ENCOUNTER — Other Ambulatory Visit: Payer: Self-pay

## 2020-11-11 ENCOUNTER — Inpatient Hospital Stay (HOSPITAL_COMMUNITY): Payer: Medicare HMO | Attending: Hematology

## 2020-11-11 VITALS — BP 155/65 | HR 73 | Temp 98.5°F | Resp 16

## 2020-11-11 DIAGNOSIS — D509 Iron deficiency anemia, unspecified: Secondary | ICD-10-CM | POA: Diagnosis not present

## 2020-11-11 DIAGNOSIS — D649 Anemia, unspecified: Secondary | ICD-10-CM

## 2020-11-11 MED ORDER — SODIUM CHLORIDE 0.9 % IV SOLN: Freq: Once | INTRAVENOUS | Status: AC

## 2020-11-11 MED ORDER — ACETAMINOPHEN 325 MG PO TABS
650.0000 mg | ORAL_TABLET | Freq: Once | ORAL | Status: AC
  Administered 2020-11-11: 650 mg via ORAL
  Filled 2020-11-11: qty 2

## 2020-11-11 MED ORDER — SODIUM CHLORIDE 0.9 % IV SOLN
300.0000 mg | Freq: Once | INTRAVENOUS | Status: AC
  Administered 2020-11-11: 300 mg via INTRAVENOUS
  Filled 2020-11-11: qty 300

## 2020-11-11 MED ORDER — LORATADINE 10 MG PO TABS
10.0000 mg | ORAL_TABLET | Freq: Once | ORAL | Status: AC
  Administered 2020-11-11: 10 mg via ORAL
  Filled 2020-11-11: qty 1

## 2020-11-11 NOTE — Patient Instructions (Signed)
[facility]  Discharge Instructions: Thank you for choosing [facility] to provide your oncology and hematology care.  If you have a lab appointment with the Cancer Center, please come in thru the Main Entrance and check in at the main information desk.  We strive to give you quality time with your provider. You may need to reschedule your appointment if you arrive late (15 or more minutes).  Arriving late affects you and other patients whose appointments are after yours.  Also, if you miss three or more appointments without notifying the office, you may be dismissed from the clinic at the provider's discretion.      For prescription refill requests, have your pharmacy contact our office and allow 72 hours for refills to be completed.     To help prevent nausea and vomiting after your treatment, we encourage you to take your nausea medication as directed.  BELOW ARE SYMPTOMS THAT SHOULD BE REPORTED IMMEDIATELY: *FEVER GREATER THAN 100.4 F (38 C) OR HIGHER *CHILLS OR SWEATING *NAUSEA AND VOMITING THAT IS NOT CONTROLLED WITH YOUR NAUSEA MEDICATION *UNUSUAL SHORTNESS OF BREATH *UNUSUAL BRUISING OR BLEEDING *URINARY PROBLEMS (pain or burning when urinating, or frequent urination) *BOWEL PROBLEMS (unusual diarrhea, constipation, pain near the anus) TENDERNESS IN MOUTH AND THROAT WITH OR WITHOUT PRESENCE OF ULCERS (sore throat, sores in mouth, or a toothache) UNUSUAL RASH, SWELLING OR PAIN  UNUSUAL VAGINAL DISCHARGE OR ITCHING   Items with * indicate a potential emergency and should be followed up as soon as possible or go to the Emergency Department if any problems should occur.  Should you have questions after your visit or need to cancel or reschedule your appointment, please contact [facility] [phone]  and follow the prompts.  Office hours are 8:00 a.m. to 4:30 p.m. Monday - Friday. Please note that voicemails left after 4:00 p.m. may not be  returned until the following business day.  We are closed weekends and major holidays. You have access to a nurse at all times for urgent questions. Please call the main number to the clinic [phone] and follow the prompts.  For any non-urgent questions, you may also contact your provider using MyChart. We now offer e-Visits for anyone 18 and older to request care online for non-urgent symptoms. For details visit [email or web address].   Also download the MyChart app! Go to the app store, search "MyChart", open the app, select [facility], and log in with your MyChart username and password.  Due to Covid, a mask is required upon entering the hospital/clinic. If you do not have a mask, one will be given to you upon arrival. For doctor visits, patients may have 1 support person aged 18 or older with them. For treatment visits, patients cannot have anyone with them due to current Covid guidelines and our immunocompromised population.  

## 2020-11-11 NOTE — Progress Notes (Signed)
Patient presents today for iron infusion.  Patient has no new complaints today.  Patient is in satisfactory condition and vital signs are stable.    Patient tolerated treatment well with no complaints voiced.  Patient left ambulatory in stable condition.  Vital signs stable at discharge.  Follow up as scheduled.

## 2020-11-11 NOTE — Progress Notes (Signed)
Chaplain engaged in an initial visit with Sara Fischer and her sister, Murray Hodgkins.  Claudene noted that her and her four sisters were going on a trip to the mountains this weekend.  This is an important trip for them to come together because of the siblings they have already lost.  Chaplain offered listening, support, and presence.    11/11/20 1100  Clinical Encounter Type  Visited With Patient and family together  Visit Type Initial

## 2020-11-16 ENCOUNTER — Inpatient Hospital Stay (HOSPITAL_COMMUNITY): Payer: Medicare HMO

## 2020-11-16 ENCOUNTER — Other Ambulatory Visit: Payer: Self-pay

## 2020-11-16 ENCOUNTER — Encounter (HOSPITAL_COMMUNITY): Payer: Self-pay

## 2020-11-16 VITALS — BP 141/66 | HR 65 | Temp 96.9°F | Resp 17

## 2020-11-16 DIAGNOSIS — D509 Iron deficiency anemia, unspecified: Secondary | ICD-10-CM | POA: Diagnosis not present

## 2020-11-16 DIAGNOSIS — D649 Anemia, unspecified: Secondary | ICD-10-CM

## 2020-11-16 MED ORDER — SODIUM CHLORIDE 0.9 % IV SOLN
400.0000 mg | Freq: Once | INTRAVENOUS | Status: AC
  Administered 2020-11-16: 400 mg via INTRAVENOUS
  Filled 2020-11-16: qty 20

## 2020-11-16 MED ORDER — LORATADINE 10 MG PO TABS
10.0000 mg | ORAL_TABLET | Freq: Once | ORAL | Status: AC
  Administered 2020-11-16: 10 mg via ORAL
  Filled 2020-11-16: qty 1

## 2020-11-16 MED ORDER — ACETAMINOPHEN 325 MG PO TABS
650.0000 mg | ORAL_TABLET | Freq: Once | ORAL | Status: AC
  Administered 2020-11-16: 650 mg via ORAL
  Filled 2020-11-16: qty 2

## 2020-11-16 MED ORDER — SODIUM CHLORIDE 0.9 % IV SOLN: Freq: Once | INTRAVENOUS | Status: AC

## 2020-11-16 NOTE — Patient Instructions (Signed)
[facility]  Discharge Instructions: Thank you for choosing [facility] to provide your oncology and hematology care.  If you have a lab appointment with the [facility], please come in thru the Main Entrance and check in at the main information desk.  Wear comfortable clothing and clothing appropriate for easy access to any Portacath or PICC line.   We strive to give you quality time with your provider. You may need to reschedule your appointment if you arrive late (15 or more minutes).  Arriving late affects you and other patients whose appointments are after yours.  Also, if you miss three or more appointments without notifying the office, you may be dismissed from the clinic at the provider's discretion.      For prescription refill requests, have your pharmacy contact our office and allow 72 hours for refills to be completed.    Today you received the following: Venofer 400 mg IV.       To help prevent nausea and vomiting after your treatment, we encourage you to take your nausea medication as directed.  BELOW ARE SYMPTOMS THAT SHOULD BE REPORTED IMMEDIATELY: *FEVER GREATER THAN 100.4 F (38 C) OR HIGHER *CHILLS OR SWEATING *NAUSEA AND VOMITING THAT IS NOT CONTROLLED WITH YOUR NAUSEA MEDICATION *UNUSUAL SHORTNESS OF BREATH *UNUSUAL BRUISING OR BLEEDING *URINARY PROBLEMS (pain or burning when urinating, or frequent urination) *BOWEL PROBLEMS (unusual diarrhea, constipation, pain near the anus) TENDERNESS IN MOUTH AND THROAT WITH OR WITHOUT PRESENCE OF ULCERS (sore throat, sores in mouth, or a toothache) UNUSUAL RASH, SWELLING OR PAIN  UNUSUAL VAGINAL DISCHARGE OR ITCHING   Items with * indicate a potential emergency and should be followed up as soon as possible or go to the Emergency Department if any problems should occur.  Please show the CHEMOTHERAPY ALERT CARD or IMMUNOTHERAPY ALERT CARD at check-in to the Emergency Department and triage  nurse.  Should you have questions after your visit or need to cancel or reschedule your appointment, please contact [facility] [phone]  and follow the prompts.  Office hours are 8:00 a.m. to 4:30 p.m. Monday - Friday. Please note that voicemails left after 4:00 p.m. may not be returned until the following business day.  We are closed weekends and major holidays. You have access to a nurse at all times for urgent questions. Please call the main number to the clinic [phone] and follow the prompts.  For any non-urgent questions, you may also contact your provider using MyChart. We now offer e-Visits for anyone 82 and older to request care online for non-urgent symptoms. For details visit [email or web address].   Also download the MyChart app! Go to the app store, search "MyChart", open the app, select [facility], and log in with your MyChart username and password.  Due to Covid, a mask is required upon entering the hospital/clinic. If you do not have a mask, one will be given to you upon arrival. For doctor visits, patients may have 1 support person aged 82 or older with them. For treatment visits, patients cannot have anyone with them due to current Covid guidelines and our immunocompromised population.

## 2020-11-16 NOTE — Progress Notes (Signed)
Patient presents today for 400 mg Venofer IV iron infusion. Vital signs are stable. Patient denies any changes since the last iron infusion. Patient denies any complaints today. MAR reviewed and updated.    Treatment given today per MD orders. Tolerated infusion without adverse affects. Vital signs stable. No complaints at this time. Discharged from clinic ambulatory in stable condition. Alert and oriented x 3. F/U with [facility] as scheduled.

## 2020-11-23 DIAGNOSIS — H04123 Dry eye syndrome of bilateral lacrimal glands: Secondary | ICD-10-CM | POA: Diagnosis not present

## 2020-11-23 DIAGNOSIS — Z0100 Encounter for examination of eyes and vision without abnormal findings: Secondary | ICD-10-CM | POA: Diagnosis not present

## 2020-11-23 DIAGNOSIS — Z961 Presence of intraocular lens: Secondary | ICD-10-CM | POA: Diagnosis not present

## 2020-11-23 DIAGNOSIS — H10413 Chronic giant papillary conjunctivitis, bilateral: Secondary | ICD-10-CM | POA: Diagnosis not present

## 2020-11-23 DIAGNOSIS — H43813 Vitreous degeneration, bilateral: Secondary | ICD-10-CM | POA: Diagnosis not present

## 2020-11-23 DIAGNOSIS — H524 Presbyopia: Secondary | ICD-10-CM | POA: Diagnosis not present

## 2020-11-25 DIAGNOSIS — Z23 Encounter for immunization: Secondary | ICD-10-CM | POA: Diagnosis not present

## 2020-11-25 DIAGNOSIS — E538 Deficiency of other specified B group vitamins: Secondary | ICD-10-CM | POA: Diagnosis not present

## 2020-12-26 ENCOUNTER — Other Ambulatory Visit (HOSPITAL_COMMUNITY): Payer: Self-pay | Admitting: Internal Medicine

## 2020-12-26 DIAGNOSIS — E538 Deficiency of other specified B group vitamins: Secondary | ICD-10-CM | POA: Diagnosis not present

## 2020-12-26 DIAGNOSIS — Z1231 Encounter for screening mammogram for malignant neoplasm of breast: Secondary | ICD-10-CM

## 2021-01-25 DIAGNOSIS — E538 Deficiency of other specified B group vitamins: Secondary | ICD-10-CM | POA: Diagnosis not present

## 2021-02-24 DIAGNOSIS — E538 Deficiency of other specified B group vitamins: Secondary | ICD-10-CM | POA: Diagnosis not present

## 2021-02-28 DIAGNOSIS — L570 Actinic keratosis: Secondary | ICD-10-CM | POA: Diagnosis not present

## 2021-02-28 DIAGNOSIS — L821 Other seborrheic keratosis: Secondary | ICD-10-CM | POA: Diagnosis not present

## 2021-02-28 DIAGNOSIS — D485 Neoplasm of uncertain behavior of skin: Secondary | ICD-10-CM | POA: Diagnosis not present

## 2021-03-08 ENCOUNTER — Ambulatory Visit (HOSPITAL_COMMUNITY)
Admission: RE | Admit: 2021-03-08 | Discharge: 2021-03-08 | Disposition: A | Discharge status: Home / Self Care | Payer: Medicare HMO | Source: Ambulatory Visit | Attending: Internal Medicine | Admitting: Internal Medicine

## 2021-03-08 ENCOUNTER — Other Ambulatory Visit: Payer: Self-pay

## 2021-03-08 DIAGNOSIS — Z1231 Encounter for screening mammogram for malignant neoplasm of breast: Secondary | ICD-10-CM | POA: Diagnosis not present

## 2021-03-29 DIAGNOSIS — E538 Deficiency of other specified B group vitamins: Secondary | ICD-10-CM | POA: Diagnosis not present

## 2021-04-21 ENCOUNTER — Inpatient Hospital Stay (HOSPITAL_COMMUNITY): Payer: Medicare HMO | Attending: Hematology

## 2021-04-21 ENCOUNTER — Other Ambulatory Visit: Payer: Self-pay

## 2021-04-21 DIAGNOSIS — D649 Anemia, unspecified: Secondary | ICD-10-CM

## 2021-04-21 DIAGNOSIS — Z79899 Other long term (current) drug therapy: Secondary | ICD-10-CM | POA: Insufficient documentation

## 2021-04-21 DIAGNOSIS — D509 Iron deficiency anemia, unspecified: Secondary | ICD-10-CM | POA: Diagnosis not present

## 2021-04-21 LAB — CBC WITH DIFFERENTIAL/PLATELET
Abs Immature Granulocytes: 0.03 10*3/uL (ref 0.00–0.07)
Basophils Absolute: 0 10*3/uL (ref 0.0–0.1)
Basophils Relative: 1 %
Eosinophils Absolute: 0.3 10*3/uL (ref 0.0–0.5)
Eosinophils Relative: 5 %
HCT: 34.6 % — ABNORMAL LOW (ref 36.0–46.0)
Hemoglobin: 11.3 g/dL — ABNORMAL LOW (ref 12.0–15.0)
Immature Granulocytes: 1 %
Lymphocytes Relative: 32 %
Lymphs Abs: 1.8 10*3/uL (ref 0.7–4.0)
MCH: 31.5 pg (ref 26.0–34.0)
MCHC: 32.7 g/dL (ref 30.0–36.0)
MCV: 96.4 fL (ref 80.0–100.0)
Monocytes Absolute: 0.6 10*3/uL (ref 0.1–1.0)
Monocytes Relative: 12 %
Neutro Abs: 2.8 10*3/uL (ref 1.7–7.7)
Neutrophils Relative %: 49 %
Platelets: 174 10*3/uL (ref 150–400)
RBC: 3.59 MIL/uL — ABNORMAL LOW (ref 3.87–5.11)
RDW: 13 % (ref 11.5–15.5)
WBC: 5.6 10*3/uL (ref 4.0–10.5)
nRBC: 0 % (ref 0.0–0.2)

## 2021-04-21 LAB — FERRITIN: Ferritin: 76 ng/mL (ref 11–307)

## 2021-04-21 LAB — IRON AND TIBC
Iron: 84 ug/dL (ref 28–170)
Saturation Ratios: 24 % (ref 10.4–31.8)
TIBC: 348 ug/dL (ref 250–450)
UIBC: 264 ug/dL

## 2021-04-24 DIAGNOSIS — E538 Deficiency of other specified B group vitamins: Secondary | ICD-10-CM | POA: Diagnosis not present

## 2021-04-27 NOTE — Progress Notes (Signed)
Sara Fischer, [place] [address]   CLINIC:  Medical Oncology/Hematology  PCP:  [facility], [place] [address] [phone]   REASON FOR VISIT:  Follow-up for iron deficiency anemia  CURRENT THERAPY: Oral iron tablets and intermittent IV iron (most recently received 1000 mg of Venofer in 3 divided doses, last on 11/16/2020)  INTERVAL HISTORY:  Sara Fischer 81 y.o. female returns for routine follow-up of her iron deficiency anemia.  She was last seen by [name] PA-C on 10/21/2020.  Last IV iron was on 11/16/2020.  At today's visit, she reports feeling well.  No recent hospitalizations, surgeries, or changes in baseline health status.  She reports dark stools from iron supplementation (taking ferrous sulfate once daily), but denies any frank hematochezia or obvious melena.  She denies any fatigue, pica, restless legs, headaches, lightheadedness, syncope.  She has not had any chest pain or dyspnea on exertion.  She continues to remain active and goes dancing at least 4 nights per week, and also enjoys spending time with her great-grandchildren.  She has 100% energy and 100% appetite. She endorses that she is maintaining a stable weight.   REVIEW OF SYSTEMS:  Review of Systems  Constitutional:  Negative for appetite change, chills, diaphoresis, fatigue, fever and unexpected weight change.  HENT:   Negative for lump/mass and nosebleeds.   Eyes:  Negative for eye problems.  Respiratory:  Negative for cough, hemoptysis and shortness of breath.   Cardiovascular:  Negative for chest pain, leg swelling and palpitations.  Gastrointestinal:  Positive for constipation (occasional). Negative for abdominal pain, blood in stool, diarrhea, nausea and vomiting.  Genitourinary:  Negative for hematuria.   Skin: Negative.   Neurological:  Negative for dizziness, headaches and light-headedness.  Hematological:  Does not  bruise/bleed easily.     PAST MEDICAL/SURGICAL HISTORY:  Past Medical History:  Diagnosis Date   High cholesterol    Hypertension    Past Surgical History:  Procedure Laterality Date   ABDOMINAL HYSTERECTOMY     APPENDECTOMY     COLONOSCOPY  2010   Dr. Gala Romney: pancolonic diverticulosis, sigmoid colon tubular adenoma removed.    COLONOSCOPY N/A 02/29/2016   Procedure: COLONOSCOPY;  Surgeon: Daneil Dolin, MD;  [facility];  Service: Endoscopy;  Laterality: N/A;  [place] N/A 12/28/2016   Procedure: HERNIA REPAIR INCISIONAL;  Surgeon: Aviva Signs, MD;  Location: [facility];  Service: General;  Laterality: N/A;   KNEE ARTHROSCOPY     bilateral   LAPAROSCOPIC BILATERAL [place] OOPHERECTOMY  2001   PARTIAL HYSTERECTOMY  1979     SOCIAL HISTORY:  Social History   Socioeconomic History   Marital status: Widowed    Spouse name: Not on file   Number of children: 4   Years of education: Not on file   Highest education level: Not on file  Occupational History   Occupation: Retired  Tobacco Use   Smoking status: Former    Packs/day: 0.50    Years: 5.00    Pack years: 2.50    Types: Cigarettes    Start date: 08/28/1963    Quit date: 11/02/1968    Years since quitting: 52.5   Smokeless tobacco: Never  Vaping Use   Vaping Use: Never used  Substance and Sexual Activity   Alcohol use: Yes    Alcohol/week: 0.0 standard drinks    Comment: wine occas.   Drug use: No  Sexual activity: Not on file  Other Topics Concern   Not on file  Social History Narrative   Right handed    Caffeine use: 1 cup coffee every morning   1 soda per day   Social Determinants of Health   Financial Resource Strain: Not on file  Food Insecurity: Not on file  Transportation Needs: Not on file  Physical Activity: Not on file  Stress: Not on file  Social Connections: Not on file  Intimate Partner Violence: Not on file    FAMILY HISTORY:  Family History   Problem Relation Age of Onset   Diabetes Mother    Heart disease Mother    Heart disease Father 8   Dementia Sister    Heart disease Brother    Diabetes Sister    Heart disease Sister    Heart disease Sister    Diabetes Sister    Arthritis Sister    Heart disease Sister    Heart disease Brother    Healthy Son    Healthy Son    Colon cancer Neg Hx     CURRENT MEDICATIONS:  Outpatient Encounter Medications as of 04/28/2021  Medication Sig   aspirin EC 81 MG tablet Take 81 mg by mouth daily.   calcium carbonate (OS-CAL - DOSED IN MG OF ELEMENTAL CALCIUM) 1250 (500 Ca) MG tablet Take 1 tablet by mouth every other day.    Cholecalciferol (D3-1000 PO) Take 1 capsule by mouth daily.   ferrous sulfate 325 (65 FE) MG tablet Take 325 mg by mouth at bedtime. Taking every other day (Patient not taking: Reported on 11/16/2020)   fluticasone (FLONASE) 50 MCG/ACT nasal spray Place 1 spray into both nostrils daily as needed for allergies or rhinitis.   furosemide (LASIX) 40 MG tablet    ketoconazole (NIZORAL) 2 % cream Apply 1 application topically 2 (two) times daily as needed.    Menaquinone-7 (VITAMIN K2 PO) Take 1 tablet by mouth daily.   metoprolol succinate (TOPROL-XL) 100 MG 24 hr tablet Take 100 mg by mouth daily.   Oxcarbazepine (TRILEPTAL) 300 MG tablet TAKE 1 TABLET BY MOUTH EVERY MORNING AND 2 TABLETS EVERY NIGHT AT BEDTIME   Propylene Glycol 0.6 % SOLN Place 1 drop into both eyes 2 (two) times daily as needed (dry eyes).   simvastatin (ZOCOR) 20 MG tablet Take 20 mg by mouth at bedtime.    traMADol (ULTRAM) 50 MG tablet Take 50 mg by mouth every 12 (twelve) hours as needed.    No facility-administered encounter medications on file as of 04/28/2021.    ALLERGIES:  Allergies  Allergen Reactions   Feldene [Piroxicam]     Headaches    Lodine [Etodolac]     headaches     PHYSICAL EXAM:  ECOG PERFORMANCE STATUS: 0 - Asymptomatic  There were no vitals filed for this  visit. There were no vitals filed for this visit. Physical Exam Constitutional:      Appearance: Normal appearance.  HENT:     Head: Normocephalic and atraumatic.     Mouth/Throat:     Mouth: Mucous membranes are moist.  Eyes:     Extraocular Movements: Extraocular movements intact.     Pupils: Pupils are equal, round, and reactive to light.  Cardiovascular:     Rate and Rhythm: Normal rate and regular rhythm.     Pulses: Normal pulses.     Heart sounds: Murmur (faint) heard.  Pulmonary:     Effort: Pulmonary effort is normal.  Breath sounds: Normal breath sounds.  Abdominal:     General: Bowel sounds are normal.     Palpations: Abdomen is soft.     Tenderness: There is no abdominal tenderness.  Musculoskeletal:        General: No swelling.     Right lower leg: No edema.     Left lower leg: No edema.  Lymphadenopathy:     Cervical: No cervical adenopathy.  Skin:    General: Skin is warm and dry.  Neurological:     General: No focal deficit present.     Mental Status: She is alert and oriented to person, place, and time.  Psychiatric:        Mood and Affect: Mood normal.        Behavior: Behavior normal.     LABORATORY DATA:  I have reviewed the labs as listed.  CBC    Component Value Date/Time   WBC 5.6 04/21/2021 1034   RBC 3.59 (L) 04/21/2021 1034   HGB 11.3 (L) 04/21/2021 1034   HCT 34.6 (L) 04/21/2021 1034   PLT 174 04/21/2021 1034   MCV 96.4 04/21/2021 1034   MCH 31.5 04/21/2021 1034   MCHC 32.7 04/21/2021 1034   RDW 13.0 04/21/2021 1034   LYMPHSABS 1.8 04/21/2021 1034   MONOABS 0.6 04/21/2021 1034   EOSABS 0.3 04/21/2021 1034   BASOSABS 0.0 04/21/2021 1034   CMP Latest Ref Rng & Units 10/18/2020 07/13/2020 04/13/2020  Glucose 70 - 99 mg/dL 121(H) - 109(H)  BUN 8 - 23 mg/dL 18 - 18  Creatinine 0.44 - 1.00 mg/dL 0.78 0.60 0.75  Sodium 135 - 145 mmol/L 141 - 141  Potassium 3.5 - 5.1 mmol/L 3.8 - 3.4(L)  Chloride 98 - 111 mmol/L 103 - 102  CO2 22 - 32  mmol/L 30 - 32  Calcium 8.9 - 10.3 mg/dL 9.6 - 9.9  Total Protein 6.5 - 8.1 g/dL 7.0 - 7.2  Total Bilirubin 0.3 - 1.2 mg/dL 0.7 - 0.6  Alkaline Phos 38 - 126 U/L 68 - 69  AST 15 - 41 U/L 20 - 19  ALT 0 - 44 U/L 13 - 13    DIAGNOSTIC IMAGING:  I have independently reviewed the relevant imaging and discussed with the patient.  ASSESSMENT & PLAN: 1.  Normocytic anemia with iron deficiency - Referred by primary care provider (Dr. Gerarda Fraction) in March 2022 - CBC on 04/28/2019 at Dr. Nolon Rod office showed hemoglobin 9.5 MCV of 89. White count was slightly low at 3.4 and platelets were 229. Differential was normal.  Ferritin was 15.  Normal O16 and folic acid. Percent saturation was 9.0, reticulocyte was 2% - Colonoscopy on 02/29/2016 showed many small and large mouth diverticula in the entire colon. Grade 1 internal hemorrhoids. - SPEP negative, LDH normal, stool occult blood normal.  Normal creatinine without evidence of kidney dysfunction. - Was previously taking iron tablet, but this was discontinued due to lack of improvement - Most recent IV iron with Venofer on 11/16/2020 - No bright red blood per rectum or melena  - Most recent labs (04/21/2021): Hgb 11.3, ferritin 76, iron saturation 24% - PLAN: She is asymptomatic, ferritin > 50 - no indication for IV iron at this time - RTC in 6 months for repeat labs and office visit. - Patient knows to call earlier if she develops symptoms of iron deficiency such as increasing fatigue, pica, or dyspnea on exertion.   PLAN SUMMARY & DISPOSITION: Same-day labs and office visit in  6 months  All questions were answered. The patient knows to call the clinic with any problems, questions or concerns.  Medical decision making: Low  Time spent on visit: I spent 10 minutes counseling the patient face to face. The total time spent in the appointment was 15 minutes and more than 50% was on counseling.   Harriett Rush, PA-C  04/28/2021 10:33 AM

## 2021-04-28 ENCOUNTER — Other Ambulatory Visit: Payer: Self-pay

## 2021-04-28 ENCOUNTER — Inpatient Hospital Stay (HOSPITAL_COMMUNITY): Payer: Medicare HMO | Admitting: Physician Assistant

## 2021-04-28 VITALS — BP 148/72 | HR 102 | Temp 97.0°F | Resp 19 | Ht 62.0 in | Wt 146.2 lb

## 2021-04-28 DIAGNOSIS — D509 Iron deficiency anemia, unspecified: Secondary | ICD-10-CM

## 2021-04-28 DIAGNOSIS — Z79899 Other long term (current) drug therapy: Secondary | ICD-10-CM | POA: Diagnosis not present

## 2021-04-28 NOTE — Patient Instructions (Signed)
[place] at [facility] Discharge Instructions  You were seen today by [name] PA-C for your iron deficiency anemia.  Your levels look great today!    LABS: Return in 6 months for repeat labs   FOLLOW-UP APPOINTMENT: Office visit after labs   Thank you for choosing [place] at [facility] to provide your oncology and hematology care.  To afford each patient quality time with our provider, please arrive at least 15 minutes before your scheduled appointment time.   If you have a lab appointment with the [facility] please come in thru the Main Entrance and check in at the main information desk.  You need to re-schedule your appointment should you arrive 10 or more minutes late.  We strive to give you quality time with our providers, and arriving late affects you and other patients whose appointments are after yours.  Also, if you no show three or more times for appointments you may be dismissed from the clinic at the providers discretion.     Again, thank you for choosing [facility]  Our hope is that these requests will decrease the amount of time that you wait before being seen by our physicians.       _____________________________________________________________  Should you have questions after your visit to [facility], please contact our office at [phone] and follow the prompts.  Our office hours are 8:00 a.m. and 4:30 p.m. Monday - Friday.  Please note that voicemails left after 4:00 p.m. may not be returned until the following business day.  We are closed weekends and major holidays.  You do have access to a nurse 24-7, just call the main number to the clinic [phone] and do not press any options, hold on the line and a nurse will answer the phone.    For prescription refill requests, have your pharmacy contact our office and allow 72 hours.    Due to Covid, you will need to wear a mask  upon entering the hospital. If you do not have a mask, a mask will be given to you at the Main Entrance upon arrival. For doctor visits, patients may have 1 support person age 17 or older with them. For treatment visits, patients can not have anyone with them due to social distancing guidelines and our immunocompromised population.

## 2021-05-25 DIAGNOSIS — E538 Deficiency of other specified B group vitamins: Secondary | ICD-10-CM | POA: Diagnosis not present

## 2021-06-26 DIAGNOSIS — E538 Deficiency of other specified B group vitamins: Secondary | ICD-10-CM | POA: Diagnosis not present

## 2021-07-04 ENCOUNTER — Ambulatory Visit (INDEPENDENT_AMBULATORY_CARE_PROVIDER_SITE_OTHER): Payer: Medicare HMO

## 2021-07-04 ENCOUNTER — Ambulatory Visit
Admission: EM | Admit: 2021-07-04 | Discharge: 2021-07-04 | Disposition: A | Discharge status: Home / Self Care | Payer: Medicare HMO | Attending: Nurse Practitioner | Admitting: Nurse Practitioner

## 2021-07-04 ENCOUNTER — Ambulatory Visit (HOSPITAL_COMMUNITY): Admission: RE | Admit: 2021-07-04 | Payer: Medicare HMO | Source: Ambulatory Visit

## 2021-07-04 DIAGNOSIS — S93401A Sprain of unspecified ligament of right ankle, initial encounter: Secondary | ICD-10-CM | POA: Diagnosis not present

## 2021-07-04 DIAGNOSIS — M25571 Pain in right ankle and joints of right foot: Secondary | ICD-10-CM

## 2021-07-04 DIAGNOSIS — S92354A Nondisplaced fracture of fifth metatarsal bone, right foot, initial encounter for closed fracture: Secondary | ICD-10-CM | POA: Diagnosis not present

## 2021-07-04 DIAGNOSIS — S92901A Unspecified fracture of right foot, initial encounter for closed fracture: Secondary | ICD-10-CM | POA: Diagnosis not present

## 2021-07-04 DIAGNOSIS — S92344A Nondisplaced fracture of fourth metatarsal bone, right foot, initial encounter for closed fracture: Secondary | ICD-10-CM | POA: Diagnosis not present

## 2021-07-04 DIAGNOSIS — R600 Localized edema: Secondary | ICD-10-CM | POA: Diagnosis not present

## 2021-07-04 DIAGNOSIS — M7989 Other specified soft tissue disorders: Secondary | ICD-10-CM | POA: Diagnosis not present

## 2021-07-04 NOTE — ED Provider Notes (Signed)
?[place] ? ? ? ?CSN: [id number] ?Arrival date & time: 07/04/21  4196 ? ? ?  ? ?History   ?Chief Complaint ?Chief Complaint  ?Patient presents with  ? Ankle Pain  ? ? ?HPI ?Sara Fischer is a 81 y.o. female.  ? ?The patient is a 81 year old female who presents with right ankle pain.  Patient states symptoms started Saturday when she was trying to get up off of a chair with her foot halfway in her shoe.  She states that she was trying to get up the shoe became tangled around her foot and she twisted her ankle outward.  She states since that time she has had pain in 1 location and the outer aspect of her ankle.  She also has some bruising to the lower leg above the ankle.  She states pain presents upon wakening but when she gets moving, the pain improves.  She denies numbness, tingling, radiation of pain, or inability to bear weight.  Patient states that she does have intermittent swelling in her right foot and ankle and she is currently on Lasix. ? ?The history is provided by the patient.  ? ?Past Medical History:  ?Diagnosis Date  ? High cholesterol   ? Hypertension   ? ? ?Patient Active Problem List  ? Diagnosis Date Noted  ? Normocytic anemia 05/26/2019  ? Facial pain 08/07/2018  ? 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 SBO (small bowel obstruction) ([place]) 12/26/2016  ? Hypertension 12/26/2016  ? Abdominal wall hernia   ? Heme positive stool 02/08/2016  ? ? ?Past Surgical History:  ?Procedure Laterality Date  ? ABDOMINAL HYSTERECTOMY    ? APPENDECTOMY    ? COLONOSCOPY  2010  ? Dr. Gala Romney: pancolonic diverticulosis, sigmoid colon tubular adenoma removed.   ? COLONOSCOPY N/A 02/29/2016  ? Procedure: COLONOSCOPY;  Surgeon: Daneil Dolin, MD;  [facility];  Service: Endoscopy;  Laterality: N/A;  230  ? INCISIONAL HERNIA REPAIR N/A 12/28/2016  ? Procedure: HERNIA REPAIR INCISIONAL;  Surgeon: Aviva Signs, MD;  Location: [facility];  Service: General;  Laterality: N/A;  ? KNEE  ARTHROSCOPY    ? bilateral  ? [place] OOPHERECTOMY  2001  ? PARTIAL HYSTERECTOMY  1979  ? ? ?OB History   ?No obstetric history on file. ?  ? ? ? ?Home Medications   ? ?Prior to Admission medications   ?Medication Sig Start Date End Date Taking? Authorizing Provider  ?aspirin EC 81 MG tablet Take 81 mg by mouth daily.    [provider]  ?calcium carbonate (OS-CAL - DOSED IN MG OF ELEMENTAL CALCIUM) 1250 (500 Ca) MG tablet Take 1 tablet by mouth every other day.     [provider]  ?Cholecalciferol (D3-1000 PO) Take 1 capsule by mouth daily.    [provider]  ?ferrous sulfate 325 (65 FE) MG tablet Take 325 mg by mouth at bedtime. Taking every other day    [provider]  ?fluticasone (FLONASE) 50 MCG/ACT nasal spray Place 1 spray into both nostrils daily as needed for allergies or rhinitis.    [provider]  ?furosemide (LASIX) 40 MG tablet  12/08/19   [provider]  ?ketoconazole (NIZORAL) 2 % cream Apply 1 application topically 2 (two) times daily as needed.  06/04/19   [provider]  ?Menaquinone-7 (VITAMIN K2 PO) Take 1 tablet by mouth daily.    [provider]  ?metoprolol succinate (TOPROL-XL) 100  MG 24 hr tablet Take 100 mg by mouth daily. 09/10/17   [provider]  ?Oxcarbazepine (TRILEPTAL) 300 MG tablet TAKE 1 TABLET BY MOUTH EVERY MORNING AND 2 TABLETS EVERY NIGHT AT BEDTIME 12/14/19   Lomax, Amy, NP  ?Propylene Glycol 0.6 % SOLN Place 1 drop into both eyes 2 (two) times daily as needed (dry eyes).    [provider]  ?simvastatin (ZOCOR) 20 MG tablet Take 20 mg by mouth at bedtime.     [provider]  ?traMADol (ULTRAM) 50 MG tablet Take 50 mg by mouth every 12 (twelve) hours as needed.  05/07/19   [provider]  ? ? ?Family History ?Family History  ?Problem Relation Age of Onset  ? Diabetes Mother   ? Heart disease Mother   ? Heart disease Father 45  ? Dementia  Sister   ? Heart disease Brother   ? Diabetes Sister   ? Heart disease Sister   ? Heart disease Sister   ? Diabetes Sister   ? Arthritis Sister   ? Heart disease Sister   ? Heart disease Brother   ? Healthy Son   ? Healthy Son   ? Colon cancer Neg Hx   ? ? ?Social History ?Social History  ? ?Tobacco Use  ? Smoking status: Former  ?  Packs/day: 0.50  ?  Years: 5.00  ?  Pack years: 2.50  ?  Types: Cigarettes  ?  Start date: 08/28/1963  ?  Quit date: 11/02/1968  ?  Years since quitting: 52.7  ? Smokeless tobacco: Never  ?Vaping Use  ? Vaping Use: Never used  ?Substance Use Topics  ? Alcohol use: Yes  ?  Alcohol/week: 0.0 standard drinks  ?  Comment: wine occas.  ? Drug use: No  ? ? ? ?Allergies   ?Feldene [piroxicam] and Lodine [etodolac] ? ? ?Review of Systems ?Review of Systems  ?Constitutional: Negative.   ?Musculoskeletal:  Negative for gait problem.  ?     Right ankle pain and swelling  ?Skin: Negative.   ?Psychiatric/Behavioral: Negative.    ? ? ?Physical Exam ?Triage Vital Signs ?ED Triage Vitals  ?Enc Vitals Group  ?   BP 07/04/21 1057 128/69  ?   Pulse Rate 07/04/21 1057 92  ?   Resp 07/04/21 1057 18  ?   Temp 07/04/21 1057 98.2 ?F (36.8 ?C)  ?   Temp Source 07/04/21 1057 Oral  ?   SpO2 07/04/21 1057 96 %  ?   Weight --   ?   Height --   ?   Head Circumference --   ?   Peak Flow --   ?   Pain Score 07/04/21 1059 8  ?   Pain Loc --   ?   Pain Edu? --   ?   Excl. in [place]? --   ? ?No data found. ? ?Updated Vital Signs ?BP 128/69 (BP Location: Right Arm)   Pulse 92   Temp 98.2 ?F (36.8 ?C) (Oral)   Resp 18   SpO2 96%  ? ?Visual Acuity ?Right Eye Distance:   ?Left Eye Distance:   ?Bilateral Distance:   ? ?Right Eye Near:   ?Left Eye Near:    ?Bilateral Near:    ? ?Physical Exam ?Vitals reviewed.  ?Constitutional:   ?   Appearance: Normal appearance.  ?Eyes:  ?   Extraocular Movements: Extraocular movements intact.  ?   Pupils: Pupils are equal, round, and reactive to light.  ?  Pulmonary:  ?   Effort: Pulmonary  effort is normal.  ?Musculoskeletal:  ?   Cervical back: Normal range of motion.  ?   Right ankle: Swelling and ecchymosis present. No deformity. Tenderness present over the lateral malleolus. Normal range of motion. Anterior drawer test negative. Normal pulse.  ?Neurological:  ?   Mental Status: She is alert.  ? ? ? ?UC Treatments / Results  ?Labs ?(all labs ordered are listed, but only abnormal results are displayed) ?Labs Reviewed - No data to display ? ?EKG ? ? ?Radiology ?DG Ankle Complete Right ? ?Result Date: 07/04/2021 ?CLINICAL DATA:  Pain and swelling EXAM: RIGHT ANKLE - COMPLETE 3 VIEW COMPARISON:  None. FINDINGS: Partially visualized fracture of the proximal fifth metatarsal. Mild degenerative changes of the tibiotalar joint. Diffuse soft tissue edema. IMPRESSION: Partially visualized fracture of the proximal fifth metatarsal. Recommend dedicated foot radiograph for further evaluation. Electronically Signed   By: Yetta Glassman M.D.   On: 07/04/2021 11:21  ? ?DG Foot Complete Right ? ?Result Date: 07/04/2021 ?CLINICAL DATA:  twisted right ankle 3 days ago EXAM: RIGHT FOOT COMPLETE - 3+ VIEW COMPARISON:  Same-day foot radiograph FINDINGS: There are nondisplaced fractures of the base of the fourth and fifth metatarsals and possibly of the second metatarsal. The fifth metatarsal fracture is in zone 3. There is soft tissue swelling of the foot. There is nonspecific calcification along the medial aspect of the talar head/neck. Calcification along the plantar fascia. There is midfoot osteopenia. There is otherwise normal midfoot alignment though these are nonweightbearing views. There is diffuse foot soft tissue swelling. IMPRESSION: Nondisplaced fractures at the base of the fourth and fifth metatarsals and possibly of the second metatarsal. The fifth metatarsal fracture is in zone 3. Normal midfoot alignment on these nonweightbearing views. Given multiple metatarsal base fractures, a CT of the foot is likely  warranted to further evaluate for Lisfranc injury. Electronically Signed   By: Maurine Simmering M.D.   On: 07/04/2021 12:05   ? ?Procedures ?Procedures (including critical care time) ? ?Medications Ordered in UC ?Medication

## 2021-07-04 NOTE — Discharge Instructions (Addendum)
Your x-rays show that you have multiple fractures in the right foot.  The ankle appears to be sprained at this time. ?I am sending you to any [facility] for a CT of the foot to rule out a Lisfranc injury. ?You will need to follow-up with [facility] within the next 24 hours.  Please call them at [phone] to schedule an appointment.  I will contact you when your CT results are received today. ?Follow-up as needed. ?

## 2021-07-04 NOTE — ED Triage Notes (Signed)
Pt reports right ankle pain, swelling and bruise x 3 days. Pt reports she twisted the ankle and hear a pop sound.  ?

## 2021-07-05 ENCOUNTER — Encounter: Payer: Self-pay | Admitting: Orthopedic Surgery

## 2021-07-05 ENCOUNTER — Ambulatory Visit: Payer: Medicare HMO | Admitting: Orthopedic Surgery

## 2021-07-05 VITALS — Ht 62.0 in | Wt 146.0 lb

## 2021-07-05 DIAGNOSIS — S92354A Nondisplaced fracture of fifth metatarsal bone, right foot, initial encounter for closed fracture: Secondary | ICD-10-CM

## 2021-07-05 DIAGNOSIS — I10 Essential (primary) hypertension: Secondary | ICD-10-CM | POA: Diagnosis not present

## 2021-07-05 DIAGNOSIS — S92344A Nondisplaced fracture of fourth metatarsal bone, right foot, initial encounter for closed fracture: Secondary | ICD-10-CM | POA: Diagnosis not present

## 2021-07-05 DIAGNOSIS — S92909A Unspecified fracture of unspecified foot, initial encounter for closed fracture: Secondary | ICD-10-CM | POA: Diagnosis not present

## 2021-07-05 DIAGNOSIS — Z6826 Body mass index (BMI) 26.0-26.9, adult: Secondary | ICD-10-CM | POA: Diagnosis not present

## 2021-07-05 NOTE — Patient Instructions (Signed)
Ok to bear weight through your heel.  Wear the boot when ambulating for the next few weeks. ? ?Elevate the foot as much as possible to help with swelling.  ? ?Follow up in 3 weeks.  ? ?Medications as needed.  ?

## 2021-07-05 NOTE — Progress Notes (Signed)
New Patient Visit ? ?Assessment: ?Sara Fischer is a 81 y.o. female with the following: ?1. Closed nondisplaced fracture of fifth metatarsal bone of right foot, initial encounter ?2. Closed nondisplaced fracture of fourth metatarsal bone of right foot, initial encounter ? ?Plan: ?Sara Fischer rolled her ankle a few days ago.  Radiographs demonstrated minimally displaced fractures at the base of the fifth and the fourth metatarsal.  Possible injuries elsewhere, but these are not obvious at this time.  She has impressive bruising and swelling throughout the right foot.  Continue to use the walking boot.  Okay to bear weight through the heel.  Limited range of motion and weightbearing through the midfoot and forefoot area.  We will plan to see her back in approximately 3 weeks.  At that time, we can discuss getting her out of the boot.  All questions were answered, and she is amenable to this plan.  Continue with medications as needed.  Elevate the foot is much as possible. ? ?Follow-up: ?Return in about 3 weeks (around 07/26/2021). ? ?Subjective: ? ?Chief Complaint  ?Patient presents with  ? Fracture  ?  Rt foot DOI 07/01/21  ? ? ?History of Present Illness: ?Sara Fischer is a 81 y.o. female who has been referred by Redmond School, MD for evaluation of right foot pain.  Just a few days ago, she rolled her ankle.  At that time, she noted immediate pain and swelling in the lateral aspect of the right foot.  She had some x-rays of the foot which demonstrated minimal displaced fractures of the base of the fourth and the fifth metatarsals.  She has been ambulating through her heel, while wearing a boot.  She has noticed the swelling and bruising.  She had some compression on her foot with an Ace wrap, and notes that this was on too tight, causing additional bruising in the lower leg. ? ? ?Review of Systems: ?No fevers or chills ?No numbness or tingling ?No chest pain ?No shortness of breath ?No bowel or bladder  dysfunction ?No GI distress ?No headaches ? ? ?Medical History: ? ?Past Medical History:  ?Diagnosis Date  ? High cholesterol   ? Hypertension   ? ? ?Past Surgical History:  ?Procedure Laterality Date  ? ABDOMINAL HYSTERECTOMY    ? APPENDECTOMY    ? COLONOSCOPY  2010  ? Dr. Gala Romney: pancolonic diverticulosis, sigmoid colon tubular adenoma removed.   ? COLONOSCOPY N/A 02/29/2016  ? Procedure: COLONOSCOPY;  Surgeon: Daneil Dolin, MD;  [facility];  Service: Endoscopy;  Laterality: N/A;  230  ? INCISIONAL HERNIA REPAIR N/A 12/28/2016  ? Procedure: HERNIA REPAIR INCISIONAL;  Surgeon: Aviva Signs, MD;  Location: [facility];  Service: General;  Laterality: N/A;  ? KNEE ARTHROSCOPY    ? bilateral  ? [place] OOPHERECTOMY  2001  ? PARTIAL HYSTERECTOMY  1979  ? ? ?Family History  ?Problem Relation Age of Onset  ? Diabetes Mother   ? Heart disease Mother   ? Heart disease Father 58  ? Dementia Sister   ? Heart disease Brother   ? Diabetes Sister   ? Heart disease Sister   ? Heart disease Sister   ? Diabetes Sister   ? Arthritis Sister   ? Heart disease Sister   ? Heart disease Brother   ? Healthy Son   ? Healthy Son   ? Colon cancer Neg Hx   ? ?Social History  ? ?Tobacco Use  ?  Smoking status: Former  ?  Packs/day: 0.50  ?  Years: 5.00  ?  Pack years: 2.50  ?  Types: Cigarettes  ?  Start date: 08/28/1963  ?  Quit date: 11/02/1968  ?  Years since quitting: 52.7  ? Smokeless tobacco: Never  ?Vaping Use  ? Vaping Use: Never used  ?Substance Use Topics  ? Alcohol use: Yes  ?  Alcohol/week: 0.0 standard drinks  ?  Comment: wine occas.  ? Drug use: No  ? ? ?Allergies  ?Allergen Reactions  ? Feldene [Piroxicam]   ?  Headaches   ? Lodine [Etodolac]   ?  headaches  ? ? ?No outpatient medications have been marked as taking for the 07/05/21 encounter (Office Visit) with Mordecai Rasmussen, MD.  ? ? ?Objective: ?Ht '5\' 2"'$  (1.575 m)   Wt 146 lb (66.2 kg)   BMI 26.70 kg/m?  ? ?Physical Exam: ? ?General: Elderly  female., Alert and oriented., and No acute distress. ?Gait: Right sided antalgic gait.  In a walking boot. ? ?Evaluation of the right foot demonstrates diffuse swelling.  She has ecchymosis on the plantar aspect of her foot, as well as the glabrous border.  Tenderness to palpation over the lateral midfoot.  She tolerates gentle range of motion.  Pain with inversion.  Toes are warm and well-perfused. ? ?IMAGING: ?I personally ordered and reviewed the following images ? ?X-ray of the right foot demonstrates minimally displaced fractures of the base of the fifth and the fourth metatarsal.  No widening of the Lisfranc joint. ? ? ?New Medications:  ?No orders of the defined types were placed in this encounter. ? ? ? ? ?Mordecai Rasmussen, MD ? ?07/05/2021 ?11:34 PM ? ? ?

## 2021-07-26 ENCOUNTER — Ambulatory Visit (INDEPENDENT_AMBULATORY_CARE_PROVIDER_SITE_OTHER): Payer: Medicare HMO

## 2021-07-26 ENCOUNTER — Ambulatory Visit (INDEPENDENT_AMBULATORY_CARE_PROVIDER_SITE_OTHER): Payer: Medicare HMO | Admitting: Orthopedic Surgery

## 2021-07-26 ENCOUNTER — Encounter: Payer: Self-pay | Admitting: Orthopedic Surgery

## 2021-07-26 DIAGNOSIS — E538 Deficiency of other specified B group vitamins: Secondary | ICD-10-CM | POA: Diagnosis not present

## 2021-07-26 DIAGNOSIS — S92344D Nondisplaced fracture of fourth metatarsal bone, right foot, subsequent encounter for fracture with routine healing: Secondary | ICD-10-CM

## 2021-07-26 DIAGNOSIS — S92354D Nondisplaced fracture of fifth metatarsal bone, right foot, subsequent encounter for fracture with routine healing: Secondary | ICD-10-CM

## 2021-07-26 NOTE — Patient Instructions (Signed)
Continue to use the walking boot, and bear weight through your heel for the next 2 weeks.  After that, he can transition to a regular shoe at all times, with full weightbearing on the right foot. ? ?Return to clinic in approximately 1 month, but if you are doing well and do not need to be seen, please call the office to cancel this appointment. ?

## 2021-07-26 NOTE — Progress Notes (Signed)
Return patient Visit ? ?Assessment: ?VINEY ACOCELLA is a 81 y.o. female with the following: ?1. Closed nondisplaced fracture of fifth metatarsal bone of right foot ?2. Closed nondisplaced fracture of fourth metatarsal bone of right foot ? ? ?Plan: ?MELONIE GERMANI rolled her ankle 3-4 weeks ago.  She has been ambulating with the assistance of a walking boot.  She has been bearing weight primarily through her heel.  Radiographs today remained stable.  She can continue to ambulate with the boot, bearing weight through her heel for an additional 2 weeks.  After that, she can transition to a regular shoe as tolerated.  I will see her back in approximately 1 month. ? ? ?Follow-up: ?Return in about 4 weeks (around 08/23/2021). ? ?Subjective: ? ?Chief Complaint  ?Patient presents with  ? Fracture  ?  Rt foot DOI 07/01/21  ? ? ?History of Present Illness: ?CHYNA KNEECE is a 81 y.o. female who returns to clinic today for repeat evaluation of her right foot.  She sustained multiple fractures of the fourth and fifth metatarsals, just less than a month ago.  She has been using a walking boot.  She has been walking a little bit in a regular shoe, but bearing weight primarily in the heel, as well as the medial forefoot.  No medications.  She feels well overall. ? ? ?Review of Systems: ?No fevers or chills ?No numbness or tingling ?No chest pain ?No shortness of breath ?No bowel or bladder dysfunction ?No GI distress ?No headaches ? ? ? ?Objective: ?There were no vitals taken for this visit. ? ?Physical Exam: ? ?General: Elderly female., Alert and oriented., and No acute distress. ?Gait: Right sided antalgic gait.  In a walking boot. ? ?Right foot with minimal swelling.  No bruising is appreciated.  No tenderness to palpation at the base of the fifth metatarsal.  She does have some tenderness to palpation at the base of the fourth metatarsal.  Easily gets to a plantigrade position.  Toes are warm and well perfused.  Active motion  is intact throughout the right foot. ? ?IMAGING: ?I personally ordered and reviewed the following images ? ?X-ray of the right foot demonstrates fractures of the base of the fifth and the fourth metatarsal, with minimal displacement.  There is been no interval change in the fracture alignment.  No additional injuries are noted. ? ?Impression: Healing right fourth and fifth metatarsal base fractures ? ? ?New Medications:  ?No orders of the defined types were placed in this encounter. ? ? ? ? ?Mordecai Rasmussen, MD ? ?07/26/2021 ?10:21 AM ? ? ?

## 2021-08-23 ENCOUNTER — Ambulatory Visit (INDEPENDENT_AMBULATORY_CARE_PROVIDER_SITE_OTHER): Payer: Medicare HMO

## 2021-08-23 ENCOUNTER — Encounter: Payer: Self-pay | Admitting: Orthopedic Surgery

## 2021-08-23 ENCOUNTER — Ambulatory Visit (INDEPENDENT_AMBULATORY_CARE_PROVIDER_SITE_OTHER): Payer: Medicare HMO | Admitting: Orthopedic Surgery

## 2021-08-23 DIAGNOSIS — S92344D Nondisplaced fracture of fourth metatarsal bone, right foot, subsequent encounter for fracture with routine healing: Secondary | ICD-10-CM

## 2021-08-23 DIAGNOSIS — S92354D Nondisplaced fracture of fifth metatarsal bone, right foot, subsequent encounter for fracture with routine healing: Secondary | ICD-10-CM

## 2021-08-23 NOTE — Progress Notes (Signed)
Return patient Visit  Assessment: Sara Fischer is a 81 y.o. female with the following: 1. Closed nondisplaced fracture of fifth metatarsal bone of right foot 2. Closed nondisplaced fracture of fourth metatarsal bone of right foot   Plan: Sara Fischer rolled her ankle 2 months ago.  She is doing well.  She is slowly returning to all of her previous activities.  She is using a regular shoe.  Radiographs demonstrates healing of the fourth and fifth metatarsal fractures.  I am pleased with her progress.  Can continue with activity as tolerated.  No restrictions.  If she has further left shoulder issues, I have urged her to contact the clinic for follow-up appointment.   Follow-up: Return if symptoms worsen or fail to improve.  Subjective:  Chief Complaint  Patient presents with   Foot Injury    Follow up w/ xray Doing well    History of Present Illness: Sara Fischer is a 81 y.o. female who returns to clinic today for repeat evaluation of her right foot.  She sustained fractures to the right fourth and fifth metatarsals, approximately 2 months ago.  She is doing well.  She has no pain in her right foot.  She is wearing a regular shoe.  She has very minimal activities.  She states that she was dancing last night.  Occasional swelling in her foot, but this improves when she gets off of her feet.  Review of Systems: No fevers or chills No numbness or tingling No chest pain No shortness of breath No bowel or bladder dysfunction No GI distress No headaches    Objective: There were no vitals taken for this visit.  Physical Exam:  General: Elderly female., Alert and oriented., and No acute distress. Gait: Right sided antalgic gait.  In a walking boot.  Right foot without swelling.  No bruising is appreciated.  Full and painless range of motion.  No pain with resisted dorsiflexion, plantarflexion, inversion and eversion.  Mild tenderness to palpation of the base of the fourth  metatarsal.  Toes warm and well-perfused.  Sensation is intact over the dorsum of the foot.  IMAGING: I personally ordered and reviewed the following images  X-rays of the right foot were obtained in clinic today.  These were compared to prior x-rays.  Fractures at the base of the fourth and fifth metatarsals are visible.  There has been no interval displacement.  There has been interval callus formation and consolidation.  Overall alignment remains stable.  No acute injuries are noted.  Impression: Healing right fourth and fifth metatarsal base fractures   New Medications:  No orders of the defined types were placed in this encounter.     Mordecai Rasmussen, MD  08/23/2021 8:53 AM

## 2021-08-25 DIAGNOSIS — E538 Deficiency of other specified B group vitamins: Secondary | ICD-10-CM | POA: Diagnosis not present

## 2021-09-04 DIAGNOSIS — Z85828 Personal history of other malignant neoplasm of skin: Secondary | ICD-10-CM | POA: Diagnosis not present

## 2021-09-04 DIAGNOSIS — L570 Actinic keratosis: Secondary | ICD-10-CM | POA: Diagnosis not present

## 2021-09-04 DIAGNOSIS — Z85820 Personal history of malignant melanoma of skin: Secondary | ICD-10-CM | POA: Diagnosis not present

## 2021-09-25 DIAGNOSIS — E538 Deficiency of other specified B group vitamins: Secondary | ICD-10-CM | POA: Diagnosis not present

## 2021-10-02 DIAGNOSIS — M75102 Unspecified rotator cuff tear or rupture of left shoulder, not specified as traumatic: Secondary | ICD-10-CM | POA: Diagnosis not present

## 2021-10-02 DIAGNOSIS — E559 Vitamin D deficiency, unspecified: Secondary | ICD-10-CM | POA: Diagnosis not present

## 2021-10-02 DIAGNOSIS — Z1331 Encounter for screening for depression: Secondary | ICD-10-CM | POA: Diagnosis not present

## 2021-10-02 DIAGNOSIS — M12812 Other specific arthropathies, not elsewhere classified, left shoulder: Secondary | ICD-10-CM | POA: Diagnosis not present

## 2021-10-02 DIAGNOSIS — I700 Atherosclerosis of aorta: Secondary | ICD-10-CM | POA: Diagnosis not present

## 2021-10-02 DIAGNOSIS — E782 Mixed hyperlipidemia: Secondary | ICD-10-CM | POA: Diagnosis not present

## 2021-10-02 DIAGNOSIS — I10 Essential (primary) hypertension: Secondary | ICD-10-CM | POA: Diagnosis not present

## 2021-10-02 DIAGNOSIS — E538 Deficiency of other specified B group vitamins: Secondary | ICD-10-CM | POA: Diagnosis not present

## 2021-10-02 DIAGNOSIS — M810 Age-related osteoporosis without current pathological fracture: Secondary | ICD-10-CM | POA: Diagnosis not present

## 2021-10-02 DIAGNOSIS — Z0001 Encounter for general adult medical examination with abnormal findings: Secondary | ICD-10-CM | POA: Diagnosis not present

## 2021-10-02 DIAGNOSIS — Z6827 Body mass index (BMI) 27.0-27.9, adult: Secondary | ICD-10-CM | POA: Diagnosis not present

## 2021-10-25 DIAGNOSIS — D518 Other vitamin B12 deficiency anemias: Secondary | ICD-10-CM | POA: Diagnosis not present

## 2021-11-03 ENCOUNTER — Inpatient Hospital Stay: Payer: Medicare HMO

## 2021-11-03 ENCOUNTER — Inpatient Hospital Stay: Payer: Medicare HMO | Attending: Physician Assistant | Admitting: Physician Assistant

## 2021-11-03 VITALS — BP 159/79 | HR 67 | Temp 97.9°F | Resp 16 | Ht 62.0 in | Wt 147.7 lb

## 2021-11-03 DIAGNOSIS — D509 Iron deficiency anemia, unspecified: Secondary | ICD-10-CM

## 2021-11-03 LAB — CBC WITH DIFFERENTIAL/PLATELET
Abs Immature Granulocytes: 0.02 10*3/uL (ref 0.00–0.07)
Basophils Absolute: 0 10*3/uL (ref 0.0–0.1)
Basophils Relative: 1 %
Eosinophils Absolute: 0.2 10*3/uL (ref 0.0–0.5)
Eosinophils Relative: 3 %
HCT: 35.2 % — ABNORMAL LOW (ref 36.0–46.0)
Hemoglobin: 11.4 g/dL — ABNORMAL LOW (ref 12.0–15.0)
Immature Granulocytes: 0 %
Lymphocytes Relative: 35 %
Lymphs Abs: 1.9 10*3/uL (ref 0.7–4.0)
MCH: 30.6 pg (ref 26.0–34.0)
MCHC: 32.4 g/dL (ref 30.0–36.0)
MCV: 94.6 fL (ref 80.0–100.0)
Monocytes Absolute: 0.7 10*3/uL (ref 0.1–1.0)
Monocytes Relative: 12 %
Neutro Abs: 2.8 10*3/uL (ref 1.7–7.7)
Neutrophils Relative %: 49 %
Platelets: 199 10*3/uL (ref 150–400)
RBC: 3.72 MIL/uL — ABNORMAL LOW (ref 3.87–5.11)
RDW: 12.9 % (ref 11.5–15.5)
WBC: 5.6 10*3/uL (ref 4.0–10.5)
nRBC: 0 % (ref 0.0–0.2)

## 2021-11-03 LAB — IRON AND TIBC
Iron: 47 ug/dL (ref 28–170)
Saturation Ratios: 12 % (ref 10.4–31.8)
TIBC: 408 ug/dL (ref 250–450)
UIBC: 361 ug/dL

## 2021-11-03 LAB — FERRITIN: Ferritin: 19 ng/mL (ref 11–307)

## 2021-11-03 NOTE — Progress Notes (Signed)
Sara Fischer, [place] [id number]   CLINIC:  Medical Oncology/Hematology  PCP:  Redmond School, [place] [address] [phone]   REASON FOR VISIT:  Follow-up for iron deficiency anemia  CURRENT THERAPY: Oral iron tablets and intermittent IV iron (most recently received 1000 mg of Venofer in 3 divided doses, last on 11/16/2020)  INTERVAL HISTORY:  Sara Fischer 81 y.o. female returns for routine follow-up of her iron deficiency anemia.  She was last seen by [name] PA-C on 04/28/2021.  Last IV iron was on 11/16/2020.  At today's visit, she reports feeling well.   No recent hospitalizations, surgeries, or changes in baseline health status.   She reports dark stools from iron supplementation (taking ferrous sulfate once daily), but denies any frank hematochezia or obvious melena.   She denies any fatigue, pica, restless legs, headaches, lightheadedness, syncope.  She has not had any chest pain or dyspnea on exertion.  She continues to remain active and goes dancing at least 4 nights per week, and also enjoys spending time with her great-grandchildren.   She has 100% energy and 100% appetite. She endorses that she is maintaining a stable weight.   REVIEW OF SYSTEMS:   Review of Systems  Constitutional:  Negative for appetite change, chills, diaphoresis, fatigue, fever and unexpected weight change.  HENT:   Negative for lump/mass and nosebleeds.   Eyes:  Negative for eye problems.  Respiratory:  Negative for cough, hemoptysis and shortness of breath.   Cardiovascular:  Negative for chest pain, leg swelling and palpitations.  Gastrointestinal:  Positive for constipation (occasional). Negative for abdominal pain, blood in stool, diarrhea, nausea and vomiting.  Genitourinary:  Negative for hematuria.   Skin: Negative.   Neurological:  Positive for headaches and numbness. Negative for dizziness and light-headedness.   Hematological:  Does not bruise/bleed easily.  Psychiatric/Behavioral:  Positive for sleep disturbance.       PAST MEDICAL/SURGICAL HISTORY:  Past Medical History:  Diagnosis Date   High cholesterol    Hypertension    Past Surgical History:  Procedure Laterality Date   ABDOMINAL HYSTERECTOMY     APPENDECTOMY     COLONOSCOPY  2010   Dr. Gala Romney: pancolonic diverticulosis, sigmoid colon tubular adenoma removed.    COLONOSCOPY N/A 02/29/2016   Procedure: COLONOSCOPY;  Surgeon: Daneil Dolin, MD;  [facility];  Service: Endoscopy;  Laterality: N/A;  [place] N/A 12/28/2016   Procedure: HERNIA REPAIR INCISIONAL;  Surgeon: Aviva Signs, MD;  Location: [facility];  Service: General;  Laterality: N/A;   KNEE ARTHROSCOPY     bilateral   LAPAROSCOPIC BILATERAL [place] OOPHERECTOMY  2001   PARTIAL HYSTERECTOMY  1979     SOCIAL HISTORY:  Social History   Socioeconomic History   Marital status: Widowed    Spouse name: Not on file   Number of children: 4   Years of education: Not on file   Highest education level: Not on file  Occupational History   Occupation: Retired  Tobacco Use   Smoking status: Former    Packs/day: 0.50    Years: 5.00    Total pack years: 2.50    Types: Cigarettes    Start date: 08/28/1963    Quit date: 11/02/1968    Years since quitting: 53.0   Smokeless tobacco: Never  Vaping Use   Vaping Use: Never used  Substance and Sexual Activity   Alcohol use: Yes  Alcohol/week: 0.0 standard drinks of alcohol    Comment: wine occas.   Drug use: No   Sexual activity: Not on file  Other Topics Concern   Not on file  Social History Narrative   Right handed    Caffeine use: 1 cup coffee every morning   1 soda per day   Social Determinants of Health   Financial Resource Strain: Not on file  Food Insecurity: Not on file  Transportation Needs: Not on file  Physical Activity: Not on file  Stress: Not on file  Social  Connections: Not on file  Intimate Partner Violence: Not on file    FAMILY HISTORY:  Family History  Problem Relation Age of Onset   Diabetes Mother    Heart disease Mother    Heart disease Father 31   Dementia Sister    Heart disease Brother    Diabetes Sister    Heart disease Sister    Heart disease Sister    Diabetes Sister    Arthritis Sister    Heart disease Sister    Heart disease Brother    Healthy Son    Healthy Son    Colon cancer Neg Hx     CURRENT MEDICATIONS:  Outpatient Encounter Medications as of 11/03/2021  Medication Sig   aspirin EC 81 MG tablet Take 81 mg by mouth daily.   calcium carbonate (OS-CAL - DOSED IN MG OF ELEMENTAL CALCIUM) 1250 (500 Ca) MG tablet Take 1 tablet by mouth every other day.    Cholecalciferol (D3-1000 PO) Take 1 capsule by mouth daily.   ferrous sulfate 325 (65 FE) MG tablet Take 325 mg by mouth at bedtime. Taking every other day   fluticasone (FLONASE) 50 MCG/ACT nasal spray Place 1 spray into both nostrils daily as needed for allergies or rhinitis.   furosemide (LASIX) 40 MG tablet    ketoconazole (NIZORAL) 2 % cream Apply 1 application topically 2 (two) times daily as needed.    Menaquinone-7 (VITAMIN K2 PO) Take 1 tablet by mouth daily.   metoprolol succinate (TOPROL-XL) 100 MG 24 hr tablet Take 100 mg by mouth daily.   Oxcarbazepine (TRILEPTAL) 300 MG tablet TAKE 1 TABLET BY MOUTH EVERY MORNING AND 2 TABLETS EVERY NIGHT AT BEDTIME   Propylene Glycol 0.6 % SOLN Place 1 drop into both eyes 2 (two) times daily as needed (dry eyes).   simvastatin (ZOCOR) 20 MG tablet Take 20 mg by mouth at bedtime.    traMADol (ULTRAM) 50 MG tablet Take 50 mg by mouth every 12 (twelve) hours as needed.    No facility-administered encounter medications on file as of 11/03/2021.    ALLERGIES:  Allergies  Allergen Reactions   Feldene [Piroxicam]     Headaches    Lodine [Etodolac]     headaches     PHYSICAL EXAM:   ECOG PERFORMANCE STATUS: 0 -  Asymptomatic  There were no vitals filed for this visit. There were no vitals filed for this visit. Physical Exam Constitutional:      Appearance: Normal appearance.  HENT:     Head: Normocephalic and atraumatic.     Mouth/Throat:     Mouth: Mucous membranes are moist.  Eyes:     Extraocular Movements: Extraocular movements intact.     Pupils: Pupils are equal, round, and reactive to light.  Cardiovascular:     Rate and Rhythm: Normal rate and regular rhythm.     Pulses: Normal pulses.     Heart sounds: Murmur (  faint) heard.  Pulmonary:     Effort: Pulmonary effort is normal.     Breath sounds: Normal breath sounds.  Abdominal:     General: Bowel sounds are normal.     Palpations: Abdomen is soft.     Tenderness: There is no abdominal tenderness.  Musculoskeletal:        General: No swelling.     Right lower leg: No edema.     Left lower leg: No edema.  Lymphadenopathy:     Cervical: No cervical adenopathy.  Skin:    General: Skin is warm and dry.  Neurological:     General: No focal deficit present.     Mental Status: She is alert and oriented to person, place, and time.  Psychiatric:        Mood and Affect: Mood normal.        Behavior: Behavior normal.      LABORATORY DATA:  I have reviewed the labs as listed.  CBC    Component Value Date/Time   WBC 5.6 04/21/2021 1034   RBC 3.59 (L) 04/21/2021 1034   HGB 11.3 (L) 04/21/2021 1034   HCT 34.6 (L) 04/21/2021 1034   PLT 174 04/21/2021 1034   MCV 96.4 04/21/2021 1034   MCH 31.5 04/21/2021 1034   MCHC 32.7 04/21/2021 1034   RDW 13.0 04/21/2021 1034   LYMPHSABS 1.8 04/21/2021 1034   MONOABS 0.6 04/21/2021 1034   EOSABS 0.3 04/21/2021 1034   BASOSABS 0.0 04/21/2021 1034      Latest Ref Rng & Units 10/18/2020    9:18 AM 07/13/2020   12:37 PM 04/13/2020   10:19 AM  CMP  Glucose 70 - 99 mg/dL 121   109   BUN 8 - 23 mg/dL 18   18   Creatinine 0.44 - 1.00 mg/dL 0.78  0.60  0.75   Sodium 135 - 145 mmol/L 141    141   Potassium 3.5 - 5.1 mmol/L 3.8   3.4   Chloride 98 - 111 mmol/L 103   102   CO2 22 - 32 mmol/L 30   32   Calcium 8.9 - 10.3 mg/dL 9.6   9.9   Total Protein 6.5 - 8.1 g/dL 7.0   7.2   Total Bilirubin 0.3 - 1.2 mg/dL 0.7   0.6   Alkaline Phos 38 - 126 U/L 68   69   AST 15 - 41 U/L 20   19   ALT 0 - 44 U/L 13   13     DIAGNOSTIC IMAGING:  I have independently reviewed the relevant imaging and discussed with the patient.  ASSESSMENT & PLAN: 1.  Normocytic anemia with iron deficiency - Referred by primary care provider (Dr. Gerarda Fraction) in March 2022 - CBC on 04/28/2019 at Dr. Nolon Rod office showed hemoglobin 9.5, MCV of 89. White count was slightly low at 3.4 and platelets were 229. Differential was normal.  Ferritin was 15.  Normal T36 and folic acid. Percent saturation was 9.0, reticulocyte was 2% - Colonoscopy on 02/29/2016 showed many small and large mouth diverticula in the entire colon. Grade 1 internal hemorrhoids. - SPEP negative, LDH normal, stool occult blood normal.  Normal creatinine without evidence of kidney dysfunction. - Was previously taking iron tablet, but this was discontinued due to lack of improvement - Most recent IV iron with Venofer on 11/16/2020 - No bright red blood per rectum or melena   - Most recent labs (11/03/2021): Hgb 11.4/MCV 94.6, ferritin 19, iron  saturation 12% with TIBC 408 -- She recently completed stool cards with her PCP, which were reportedly normal - PLAN: Recommend IV Venofer 300 mg x 3 - RTC in 6 months for repeat labs and office visit.   - Patient knows to call earlier if she develops symptoms of iron deficiency such as increasing fatigue, pica, or dyspnea on exertion.   PLAN SUMMARY & DISPOSITION:   Same-day labs and office visit in 6 months  All questions were answered. The patient knows to call the clinic with any problems, questions or concerns.  Medical decision making: Low    Time spent on visit: I spent 10 minutes counseling the  patient face to face. The total time spent in the appointment was 15 minutes and more than 50% was on counseling.   Harriett Rush, PA-C  11/03/2021 5:44 PM

## 2021-11-03 NOTE — Patient Instructions (Signed)
[facility] **   You were seen today by [name] PA-C for your iron deficiency anemia.    IRON DEFICIENCY ANEMIA Your blood count remains mildly low but stable at its baseline. Your iron levels are continuing to slowly drift downward.  This may be that your body has a difficult time absorbing iron from your stomach and intestines.  Your primary care doctor has checked your stool to see if there is any blood in it. We will schedule you for IV iron x3 doses. We will see you for repeat labs and follow-up visit in 6 months.  MEDICATIONS: Continue taking once daily iron pill  FOLLOW-UP APPOINTMENT: Same-day labs and office visit in 6 months  ** Thank you for trusting me with your healthcare!  I strive to provide all of my patients with quality care at each visit.  If you receive a survey for this visit, I would be so grateful to you for taking the time to provide feedback.  Thank you in advance!  ~ Eamon Tantillo                   Dr. Derek Jack   &   [name], PA-C   - - - - - - - - - - - - - - - - - -    Thank you for choosing [facility] at [facility] to provide your oncology and hematology care.  To afford each patient quality time with our provider, please arrive at least 15 minutes before your scheduled appointment time.   If you have a lab appointment with the [facility] please come in thru the Main Entrance and check in at the main information desk.  You need to re-schedule your appointment should you arrive 10 or more minutes late.  We strive to give you quality time with our providers, and arriving late affects you and other patients whose appointments are after yours.  Also, if you no show three or more times for appointments you may be dismissed from the clinic at the providers discretion.     Again, thank you for choosing [facility].  Our hope  is that these requests will decrease the amount of time that you wait before being seen by our physicians.       _____________________________________________________________  Should you have questions after your visit to [facility], please contact our office at [phone] and follow the prompts.  Our office hours are 8:00 a.m. and 4:30 p.m. Monday - Friday.  Please note that voicemails left after 4:00 p.m. may not be returned until the following business day.  We are closed weekends and major holidays.  You do have access to a nurse 24-7, just call the main number to the clinic [phone] and do not press any options, hold on the line and a nurse will answer the phone.    For prescription refill requests, have your pharmacy contact our office and allow 72 hours.

## 2021-11-22 DIAGNOSIS — Z23 Encounter for immunization: Secondary | ICD-10-CM | POA: Diagnosis not present

## 2021-11-22 DIAGNOSIS — D518 Other vitamin B12 deficiency anemias: Secondary | ICD-10-CM | POA: Diagnosis not present

## 2021-11-24 ENCOUNTER — Inpatient Hospital Stay: Payer: Medicare HMO | Attending: Physician Assistant

## 2021-11-24 VITALS — BP 140/62 | HR 64 | Temp 97.5°F | Resp 18

## 2021-11-24 DIAGNOSIS — D509 Iron deficiency anemia, unspecified: Secondary | ICD-10-CM | POA: Diagnosis not present

## 2021-11-24 DIAGNOSIS — D649 Anemia, unspecified: Secondary | ICD-10-CM

## 2021-11-24 MED ORDER — ACETAMINOPHEN 325 MG PO TABS
650.0000 mg | ORAL_TABLET | Freq: Once | ORAL | Status: AC
  Administered 2021-11-24: 650 mg via ORAL
  Filled 2021-11-24: qty 2

## 2021-11-24 MED ORDER — SODIUM CHLORIDE 0.9 % IV SOLN
300.0000 mg | Freq: Once | INTRAVENOUS | Status: AC
  Administered 2021-11-24: 300 mg via INTRAVENOUS
  Filled 2021-11-24: qty 300

## 2021-11-24 MED ORDER — SODIUM CHLORIDE 0.9 % IV SOLN: Freq: Once | INTRAVENOUS | Status: AC

## 2021-11-24 MED ORDER — LORATADINE 10 MG PO TABS
10.0000 mg | ORAL_TABLET | Freq: Once | ORAL | Status: AC
  Administered 2021-11-24: 10 mg via ORAL
  Filled 2021-11-24: qty 1

## 2021-11-24 NOTE — Progress Notes (Signed)
Patient presents today for Venofer 300 mg IV iron infusion. Vital signs are stable. Patient denies any changes since the last iron infusion. Patient denies any complaints today. MAR reviewed and updated.    Venofer 300 mg given today per MD orders. Tolerated infusion without adverse affects. Vital signs stable. No complaints at this time. Discharged from clinic ambulatory in stable condition. Alert and oriented x 3. F/U with [facility] as scheduled.   

## 2021-11-24 NOTE — Patient Instructions (Signed)
[facility]  Discharge Instructions: Thank you for choosing [facility] to provide your oncology and hematology care.  If you have a lab appointment with the Cancer Center, please come in thru the Main Entrance and check in at the main information desk.  Wear comfortable clothing and clothing appropriate for easy access to any Portacath or PICC line.   We strive to give you quality time with your provider. You may need to reschedule your appointment if you arrive late (15 or more minutes).  Arriving late affects you and other patients whose appointments are after yours.  Also, if you miss three or more appointments without notifying the office, you may be dismissed from the clinic at the provider's discretion.      For prescription refill requests, have your pharmacy contact our office and allow 72 hours for refills to be completed.    Today you received the following chemotherapy and/or immunotherapy agents Venofer 300 mg  Iron Sucrose Injection What is this medication? IRON SUCROSE (EYE ern SOO krose) treats low levels of iron (iron deficiency anemia) in people with kidney disease. Iron is a mineral that plays an important role in making red blood cells, which carry oxygen from your lungs to the rest of your body. This medicine may be used for other purposes; ask your health care provider or pharmacist if you have questions. COMMON BRAND NAME(S): Venofer What should I tell my care team before I take this medication? They need to know if you have any of these conditions: Anemia not caused by low iron levels Heart disease High levels of iron in the blood Kidney disease Liver disease An unusual or allergic reaction to iron, other medications, foods, dyes, or preservatives Pregnant or trying to get pregnant Breast-feeding How should I use this medication? This medication is for infusion into a vein. It is given in a hospital or clinic setting. Talk to  your care team about the use of this medication in children. While this medication may be prescribed for children as young as 2 years for selected conditions, precautions do apply. Overdosage: If you think you have taken too much of this medicine contact a poison control center or emergency room at once. NOTE: This medicine is only for you. Do not share this medicine with others. What if I miss a dose? It is important not to miss your dose. Call your care team if you are unable to keep an appointment. What may interact with this medication? Do not take this medication with any of the following: Deferoxamine Dimercaprol Other iron products This medication may also interact with the following: Chloramphenicol Deferasirox This list may not describe all possible interactions. Give your health care provider a list of all the medicines, herbs, non-prescription drugs, or dietary supplements you use. Also tell them if you smoke, drink alcohol, or use illegal drugs. Some items may interact with your medicine. What should I watch for while using this medication? Visit your care team regularly. Tell your care team if your symptoms do not start to get better or if they get worse. You may need blood work done while you are taking this medication. You may need to follow a special diet. Talk to your care team. Foods that contain iron include: whole grains/cereals, dried fruits, beans, or peas, leafy green vegetables, and organ meats (liver, kidney). What side effects may I notice from receiving this medication? Side effects that you should report to your care team as   soon as possible: Allergic reactions--skin rash, itching, hives, swelling of the face, lips, tongue, or throat Low blood pressure--dizziness, feeling faint or lightheaded, blurry vision Shortness of breath Side effects that usually do not require medical attention (report to your care team if they continue or are  bothersome): Flushing Headache Joint pain Muscle pain Nausea Pain, redness, or irritation at injection site This list may not describe all possible side effects. Call your doctor for medical advice about side effects. You may report side effects to FDA at 1-800-FDA-1088. Where should I keep my medication? This medication is given in a hospital or clinic and will not be stored at home. NOTE: This sheet is a summary. It may not cover all possible information. If you have questions about this medicine, talk to your doctor, pharmacist, or health care provider.  2023 Elsevier/Gold Standard (2007-04-19 00:00:00)       To help prevent nausea and vomiting after your treatment, we encourage you to take your nausea medication as directed.  BELOW ARE SYMPTOMS THAT SHOULD BE REPORTED IMMEDIATELY: *FEVER GREATER THAN 100.4 F (38 C) OR HIGHER *CHILLS OR SWEATING *NAUSEA AND VOMITING THAT IS NOT CONTROLLED WITH YOUR NAUSEA MEDICATION *UNUSUAL SHORTNESS OF BREATH *UNUSUAL BRUISING OR BLEEDING *URINARY PROBLEMS (pain or burning when urinating, or frequent urination) *BOWEL PROBLEMS (unusual diarrhea, constipation, pain near the anus) TENDERNESS IN MOUTH AND THROAT WITH OR WITHOUT PRESENCE OF ULCERS (sore throat, sores in mouth, or a toothache) UNUSUAL RASH, SWELLING OR PAIN  UNUSUAL VAGINAL DISCHARGE OR ITCHING   Items with * indicate a potential emergency and should be followed up as soon as possible or go to the Emergency Department if any problems should occur.  Please show the CHEMOTHERAPY ALERT CARD or IMMUNOTHERAPY ALERT CARD at check-in to the Emergency Department and triage nurse.  Should you have questions after your visit or need to cancel or reschedule your appointment, please contact [facility] [phone]  and follow the prompts.  Office hours are 8:00 a.m. to 4:30 p.m. Monday - Friday. Please note that voicemails left after 4:00 p.m. may not be returned until  the following business day.  We are closed weekends and major holidays. You have access to a nurse at all times for urgent questions. Please call the main number to the clinic [phone] and follow the prompts.  For any non-urgent questions, you may also contact your provider using MyChart. We now offer e-Visits for anyone 18 and older to request care online for non-urgent symptoms. For details visit [email or web address].   Also download the MyChart app! Go to the app store, search "MyChart", open the app, select [facility], and log in with your MyChart username and password.  Masks are optional in the cancer centers. If you would like for your care team to wear a mask while they are taking care of you, please let them know. You may have one support person who is at least 83 years old accompany you for your appointments.  

## 2021-12-01 ENCOUNTER — Inpatient Hospital Stay: Payer: Medicare HMO

## 2021-12-07 MED FILL — Iron Sucrose Inj 20 MG/ML (Fe Equiv): INTRAVENOUS | Qty: 15 | Status: AC

## 2021-12-08 ENCOUNTER — Inpatient Hospital Stay: Payer: Medicare HMO

## 2021-12-08 VITALS — BP 146/52 | HR 80 | Temp 96.7°F | Resp 18

## 2021-12-08 DIAGNOSIS — D649 Anemia, unspecified: Secondary | ICD-10-CM

## 2021-12-08 DIAGNOSIS — D509 Iron deficiency anemia, unspecified: Secondary | ICD-10-CM | POA: Diagnosis not present

## 2021-12-08 MED ORDER — SODIUM CHLORIDE 0.9 % IV SOLN: Freq: Once | INTRAVENOUS | Status: AC

## 2021-12-08 MED ORDER — LORATADINE 10 MG PO TABS
10.0000 mg | ORAL_TABLET | Freq: Once | ORAL | Status: DC
  Filled 2021-12-08: qty 1

## 2021-12-08 MED ORDER — ACETAMINOPHEN 325 MG PO TABS
650.0000 mg | ORAL_TABLET | Freq: Once | ORAL | Status: DC
  Filled 2021-12-08: qty 2

## 2021-12-08 MED ORDER — SODIUM CHLORIDE 0.9 % IV SOLN
300.0000 mg | Freq: Once | INTRAVENOUS | Status: AC
  Administered 2021-12-08: 300 mg via INTRAVENOUS
  Filled 2021-12-08: qty 300

## 2021-12-08 NOTE — Progress Notes (Signed)
Patient presents today for IV Venofer 300 mg infusion. Vital signs are stable. Patient denies any changes since the last iron infusion. Patient denies any complaints today. MAR reviewed and updated.  Pre-medications taken at home prior to arrival at 09:55 . Tylenol 650 mg , Claritin 10 mg po.   Venofer given today per MD orders. Tolerated infusion without adverse affects. Vital signs stable. No complaints at this time. Discharged from clinic ambulatory in stable condition. Alert and oriented x 3. F/U with [facility] as scheduled.

## 2021-12-08 NOTE — Patient Instructions (Signed)
[facility]  Discharge Instructions: Thank you for choosing [facility] to provide your oncology and hematology care.  If you have a lab appointment with the [facility], please come in thru the Main Entrance and check in at the main information desk.  Wear comfortable clothing and clothing appropriate for easy access to any Portacath or PICC line.   We strive to give you quality time with your provider. You may need to reschedule your appointment if you arrive late (15 or more minutes).  Arriving late affects you and other patients whose appointments are after yours.  Also, if you miss three or more appointments without notifying the office, you may be dismissed from the clinic at the provider's discretion.      For prescription refill requests, have your pharmacy contact our office and allow 72 hours for refills to be completed.    Today you received the following : Venofer 300 mg .  Iron Sucrose Injection What is this medication? IRON SUCROSE (EYE ern SOO krose) treats low levels of iron (iron deficiency anemia) in people with kidney disease. Iron is a mineral that plays an important role in making red blood cells, which carry oxygen from your lungs to the rest of your body. This medicine may be used for other purposes; ask your health care provider or pharmacist if you have questions. COMMON BRAND NAME(S): Venofer What should I tell my care team before I take this medication? They need to know if you have any of these conditions: Anemia not caused by low iron levels Heart disease High levels of iron in the blood Kidney disease Liver disease An unusual or allergic reaction to iron, other medications, foods, dyes, or preservatives Pregnant or trying to get pregnant Breast-feeding How should I use this medication? This medication is for infusion into a vein. It is given in a hospital or clinic setting. Talk to your care team about the use of this  medication in children. While this medication may be prescribed for children as young as 2 years for selected conditions, precautions do apply. Overdosage: If you think you have taken too much of this medicine contact a poison control center or emergency room at once. NOTE: This medicine is only for you. Do not share this medicine with others. What if I miss a dose? It is important not to miss your dose. Call your care team if you are unable to keep an appointment. What may interact with this medication? Do not take this medication with any of the following: Deferoxamine Dimercaprol Other iron products This medication may also interact with the following: Chloramphenicol Deferasirox This list may not describe all possible interactions. Give your health care provider a list of all the medicines, herbs, non-prescription drugs, or dietary supplements you use. Also tell them if you smoke, drink alcohol, or use illegal drugs. Some items may interact with your medicine. What should I watch for while using this medication? Visit your care team regularly. Tell your care team if your symptoms do not start to get better or if they get worse. You may need blood work done while you are taking this medication. You may need to follow a special diet. Talk to your care team. Foods that contain iron include: whole grains/cereals, dried fruits, beans, or peas, leafy green vegetables, and organ meats (liver, kidney). What side effects may I notice from receiving this medication? Side effects that you should report to your care team as soon as  possible: Allergic reactions--skin rash, itching, hives, swelling of the face, lips, tongue, or throat Low blood pressure--dizziness, feeling faint or lightheaded, blurry vision Shortness of breath Side effects that usually do not require medical attention (report to your care team if they continue or are bothersome): Flushing Headache Joint pain Muscle  pain Nausea Pain, redness, or irritation at injection site This list may not describe all possible side effects. Call your doctor for medical advice about side effects. You may report side effects to FDA at 1-800-FDA-1088. Where should I keep my medication? This medication is given in a hospital or clinic and will not be stored at home. NOTE: This sheet is a summary. It may not cover all possible information. If you have questions about this medicine, talk to your doctor, pharmacist, or health care provider.  2023 Elsevier/Gold Standard (2007-04-19 00:00:00)       To help prevent nausea and vomiting after your treatment, we encourage you to take your nausea medication as directed.  BELOW ARE SYMPTOMS THAT SHOULD BE REPORTED IMMEDIATELY: *FEVER GREATER THAN 100.4 F (38 C) OR HIGHER *CHILLS OR SWEATING *NAUSEA AND VOMITING THAT IS NOT CONTROLLED WITH YOUR NAUSEA MEDICATION *UNUSUAL SHORTNESS OF BREATH *UNUSUAL BRUISING OR BLEEDING *URINARY PROBLEMS (pain or burning when urinating, or frequent urination) *BOWEL PROBLEMS (unusual diarrhea, constipation, pain near the anus) TENDERNESS IN MOUTH AND THROAT WITH OR WITHOUT PRESENCE OF ULCERS (sore throat, sores in mouth, or a toothache) UNUSUAL RASH, SWELLING OR PAIN  UNUSUAL VAGINAL DISCHARGE OR ITCHING   Items with * indicate a potential emergency and should be followed up as soon as possible or go to the Emergency Department if any problems should occur.  Please show the CHEMOTHERAPY ALERT CARD or IMMUNOTHERAPY ALERT CARD at check-in to the Emergency Department and triage nurse.  Should you have questions after your visit or need to cancel or reschedule your appointment, please contact [facility] [phone]  and follow the prompts.  Office hours are 8:00 a.m. to 4:30 p.m. Monday - Friday. Please note that voicemails left after 4:00 p.m. may not be returned until the following business day.  We are closed weekends and  major holidays. You have access to a nurse at all times for urgent questions. Please call the main number to the clinic [phone] and follow the prompts.  For any non-urgent questions, you may also contact your provider using MyChart. We now offer e-Visits for anyone 15 and older to request care online for non-urgent symptoms. For details visit [email or web address].   Also download the MyChart app! Go to the app store, search "MyChart", open the app, select [facility], and log in with your MyChart username and password.  Masks are optional in the cancer centers. If you would like for your care team to wear a mask while they are taking care of you, please let them know. You may have one support person who is at least 83 years old accompany you for your appointments.

## 2021-12-15 ENCOUNTER — Inpatient Hospital Stay: Payer: Medicare HMO | Attending: Physician Assistant

## 2021-12-15 VITALS — BP 152/48 | HR 59 | Temp 97.7°F | Resp 16

## 2021-12-15 DIAGNOSIS — D649 Anemia, unspecified: Secondary | ICD-10-CM

## 2021-12-15 DIAGNOSIS — D509 Iron deficiency anemia, unspecified: Secondary | ICD-10-CM | POA: Insufficient documentation

## 2021-12-15 MED ORDER — SODIUM CHLORIDE 0.9 % IV SOLN: Freq: Once | INTRAVENOUS | Status: AC

## 2021-12-15 MED ORDER — LORATADINE 10 MG PO TABS
10.0000 mg | ORAL_TABLET | Freq: Once | ORAL | Status: DC
  Filled 2021-12-15: qty 1

## 2021-12-15 MED ORDER — SODIUM CHLORIDE 0.9 % IV SOLN
300.0000 mg | Freq: Once | INTRAVENOUS | Status: AC
  Administered 2021-12-15: 300 mg via INTRAVENOUS
  Filled 2021-12-15: qty 300

## 2021-12-15 MED ORDER — ACETAMINOPHEN 325 MG PO TABS
650.0000 mg | ORAL_TABLET | Freq: Once | ORAL | Status: DC
  Filled 2021-12-15: qty 2

## 2021-12-15 NOTE — Progress Notes (Signed)
Patient presents today for Venofer infusion per providers order.  Vital signs WNL.  Patient took premedications at home prior to visit.    Peripheral IV started and blood return noted pre and post infusion.  Stable during infusion without adverse affects.  Vital signs stable.  No complaints at this time.  Discharge from clinic ambulatory in stable condition.  Alert and oriented X 3.  Follow up with [facility] as scheduled.

## 2021-12-15 NOTE — Patient Instructions (Signed)
[facility]  Discharge Instructions: Thank you for choosing [facility] to provide your oncology and hematology care.  If you have a lab appointment with the Cancer Center, please come in thru the Main Entrance and check in at the main information desk.  Wear comfortable clothing and clothing appropriate for easy access to any Portacath or PICC line.   We strive to give you quality time with your provider. You may need to reschedule your appointment if you arrive late (15 or more minutes).  Arriving late affects you and other patients whose appointments are after yours.  Also, if you miss three or more appointments without notifying the office, you may be dismissed from the clinic at the provider's discretion.      For prescription refill requests, have your pharmacy contact our office and allow 72 hours for refills to be completed.    Today you received the following chemotherapy and/or immunotherapy agents venofer      To help prevent nausea and vomiting after your treatment, we encourage you to take your nausea medication as directed.  BELOW ARE SYMPTOMS THAT SHOULD BE REPORTED IMMEDIATELY: *FEVER GREATER THAN 100.4 F (38 C) OR HIGHER *CHILLS OR SWEATING *NAUSEA AND VOMITING THAT IS NOT CONTROLLED WITH YOUR NAUSEA MEDICATION *UNUSUAL SHORTNESS OF BREATH *UNUSUAL BRUISING OR BLEEDING *URINARY PROBLEMS (pain or burning when urinating, or frequent urination) *BOWEL PROBLEMS (unusual diarrhea, constipation, pain near the anus) TENDERNESS IN MOUTH AND THROAT WITH OR WITHOUT PRESENCE OF ULCERS (sore throat, sores in mouth, or a toothache) UNUSUAL RASH, SWELLING OR PAIN  UNUSUAL VAGINAL DISCHARGE OR ITCHING   Items with * indicate a potential emergency and should be followed up as soon as possible or go to the Emergency Department if any problems should occur.  Please show the CHEMOTHERAPY ALERT CARD or IMMUNOTHERAPY ALERT CARD at check-in to the Emergency  Department and triage nurse.  Should you have questions after your visit or need to cancel or reschedule your appointment, please contact [facility] [phone]  and follow the prompts.  Office hours are 8:00 a.m. to 4:30 p.m. Monday - Friday. Please note that voicemails left after 4:00 p.m. may not be returned until the following business day.  We are closed weekends and major holidays. You have access to a nurse at all times for urgent questions. Please call the main number to the clinic [phone] and follow the prompts.  For any non-urgent questions, you may also contact your provider using MyChart. We now offer e-Visits for anyone 18 and older to request care online for non-urgent symptoms. For details visit [email or web address].   Also download the MyChart app! Go to the app store, search "MyChart", open the app, select , and log in with your MyChart username and password.  Masks are optional in the cancer centers. If you would like for your care team to wear a mask while they are taking care of you, please let them know. You may have one support person who is at least 83 years old accompany you for your appointments.  

## 2021-12-27 DIAGNOSIS — D649 Anemia, unspecified: Secondary | ICD-10-CM | POA: Diagnosis not present

## 2021-12-27 DIAGNOSIS — D518 Other vitamin B12 deficiency anemias: Secondary | ICD-10-CM | POA: Diagnosis not present

## 2022-01-24 DIAGNOSIS — D518 Other vitamin B12 deficiency anemias: Secondary | ICD-10-CM | POA: Diagnosis not present

## 2022-02-22 DIAGNOSIS — D518 Other vitamin B12 deficiency anemias: Secondary | ICD-10-CM | POA: Diagnosis not present

## 2022-02-26 DIAGNOSIS — L218 Other seborrheic dermatitis: Secondary | ICD-10-CM | POA: Diagnosis not present

## 2022-02-26 DIAGNOSIS — Z85820 Personal history of malignant melanoma of skin: Secondary | ICD-10-CM | POA: Diagnosis not present

## 2022-02-26 DIAGNOSIS — Z1283 Encounter for screening for malignant neoplasm of skin: Secondary | ICD-10-CM | POA: Diagnosis not present

## 2022-02-26 DIAGNOSIS — D239 Other benign neoplasm of skin, unspecified: Secondary | ICD-10-CM | POA: Diagnosis not present

## 2022-02-26 DIAGNOSIS — Z85828 Personal history of other malignant neoplasm of skin: Secondary | ICD-10-CM | POA: Diagnosis not present

## 2022-02-26 DIAGNOSIS — L570 Actinic keratosis: Secondary | ICD-10-CM | POA: Diagnosis not present

## 2022-03-28 DIAGNOSIS — D518 Other vitamin B12 deficiency anemias: Secondary | ICD-10-CM | POA: Diagnosis not present

## 2022-04-16 ENCOUNTER — Other Ambulatory Visit (HOSPITAL_COMMUNITY): Payer: Self-pay | Admitting: Internal Medicine

## 2022-04-16 DIAGNOSIS — Z1231 Encounter for screening mammogram for malignant neoplasm of breast: Secondary | ICD-10-CM

## 2022-04-18 ENCOUNTER — Ambulatory Visit (HOSPITAL_COMMUNITY)
Admission: RE | Admit: 2022-04-18 | Discharge: 2022-04-18 | Disposition: A | Discharge status: Home / Self Care | Payer: Medicare HMO | Source: Ambulatory Visit | Attending: Internal Medicine | Admitting: Internal Medicine

## 2022-04-18 DIAGNOSIS — Z1231 Encounter for screening mammogram for malignant neoplasm of breast: Secondary | ICD-10-CM | POA: Insufficient documentation

## 2022-04-26 DIAGNOSIS — D518 Other vitamin B12 deficiency anemias: Secondary | ICD-10-CM | POA: Diagnosis not present

## 2022-05-04 NOTE — Progress Notes (Unsigned)
[facility] [place], [place] [address]   CLINIC:  Medical Oncology/Hematology  PCP:  [facility], [place] [id number] [phone]   REASON FOR VISIT:  Follow-up for iron deficiency anemia   CURRENT THERAPY: Oral iron tablets and intermittent IV iron   INTERVAL HISTORY:   Sara Fischer 82 y.o. female returns for routine follow-up of her iron deficiency anemia.  She was last seen by [name] PA-C on 04/28/2021.  Last IV iron was on 12/15/2021.   At today's visit, she reports feeling well.   No recent hospitalizations, surgeries, or changes in baseline health status.  She denies any frank hematochezia or obvious melena.  She denies any fatigue, pica, restless legs, headaches, lightheadedness, syncope.  She has not had any chest pain or dyspnea on exertion.  She continues to remain active and goes dancing at least 4 nights per week, and also enjoys spending time with her great-grandchildren.   She has 100% energy and 100% appetite. She endorses that she is maintaining a stable weight.  ASSESSMENT & PLAN:  1.  Normocytic anemia with iron deficiency - Referred by primary care provider (Dr. Gerarda Fraction) in March 2022 - CBC on 04/28/2019 at Dr. Nolon Rod office showed hemoglobin 9.5, MCV of 89. White count was slightly low at 3.4 and platelets were 229. Differential was normal.  Ferritin was 15.  Normal 123456 and folic acid. Percent saturation was 9.0, reticulocyte was 2% - Colonoscopy on 02/29/2016 showed many small and large mouth diverticula in the entire colon. Grade 1 internal hemorrhoids. - SPEP negative, LDH normal, stool occult blood normal.  Normal creatinine without evidence of kidney dysfunction. - Was previously taking iron tablet, but this was discontinued due to lack of improvement - Most recent IV iron with Venofer on 12/15/2021 - No bright red blood per rectum or melena   - Labs today (05/07/2022): Hgb 11.9/MCV 94.7, ferritin  82, iron saturation 21% -- She completed stool cards with her PCP, which were reportedly normal - PLAN: No indication for IV iron at this time  - RTC in 6 months for repeat labs and office visit.   - Patient knows to call earlier if she develops symptoms of iron deficiency such as increasing fatigue, pica, or dyspnea on exertion.  PLAN SUMMARY: >> Labs in 6 months = CBC/D, CMP, ferritin, iron/TIBC, B12, MMA >> OFFICE visit in 6 months (1 week after labs)      REVIEW OF SYSTEMS:   Review of Systems  Constitutional:  Negative for appetite change, chills, diaphoresis, fatigue, fever and unexpected weight change.  HENT:   Negative for lump/mass and nosebleeds.   Eyes:  Negative for eye problems.  Respiratory:  Negative for cough, hemoptysis and shortness of breath.   Cardiovascular:  Negative for chest pain, leg swelling and palpitations.  Gastrointestinal:  Positive for constipation. Negative for abdominal pain, blood in stool, diarrhea, nausea and vomiting.  Genitourinary:  Negative for hematuria.   Skin: Negative.   Neurological:  Positive for numbness (fingertips). Negative for dizziness, headaches and light-headedness.  Hematological:  Does not bruise/bleed easily.     PHYSICAL EXAM:  ECOG PERFORMANCE STATUS: 0 - Asymptomatic  There were no vitals filed for this visit. There were no vitals filed for this visit. Physical Exam Constitutional:      Appearance: Normal appearance. She is normal weight.  Cardiovascular:     Heart sounds: Normal heart sounds.  Pulmonary:     Breath sounds:  Normal breath sounds.  Neurological:     General: No focal deficit present.     Mental Status: Mental status is at baseline.  Psychiatric:        Behavior: Behavior normal. Behavior is cooperative.     PAST MEDICAL/SURGICAL HISTORY:  Past Medical History:  Diagnosis Date   High cholesterol    Hypertension    Past Surgical History:  Procedure Laterality Date   ABDOMINAL HYSTERECTOMY      APPENDECTOMY     COLONOSCOPY  2010   Dr. Gala Romney: pancolonic diverticulosis, sigmoid colon tubular adenoma removed.    COLONOSCOPY N/A 02/29/2016   Procedure: COLONOSCOPY;  Surgeon: Daneil Dolin, MD;  [facility];  Service: Endoscopy;  Laterality: N/A;  [place] N/A 12/28/2016   Procedure: HERNIA REPAIR INCISIONAL;  Surgeon: Aviva Signs, MD;  Location: [facility];  Service: General;  Laterality: N/A;   KNEE ARTHROSCOPY     bilateral   LAPAROSCOPIC BILATERAL SALPINGO OOPHERECTOMY  2001   PARTIAL HYSTERECTOMY  1979    SOCIAL HISTORY:  Social History   Socioeconomic History   Marital status: Widowed    Spouse name: Not on file   Number of children: 4   Years of education: Not on file   Highest education level: Not on file  Occupational History   Occupation: Retired  Tobacco Use   Smoking status: Former    Packs/day: 0.50    Years: 5.00    Total pack years: 2.50    Types: Cigarettes    Start date: 08/28/1963    Quit date: 11/02/1968    Years since quitting: 53.5   Smokeless tobacco: Never  Vaping Use   Vaping Use: Never used  Substance and Sexual Activity   Alcohol use: Yes    Alcohol/week: 0.0 standard drinks of alcohol    Comment: wine occas.   Drug use: No   Sexual activity: Not on file  Other Topics Concern   Not on file  Social History Narrative   Right handed    Caffeine use: 1 cup coffee every morning   1 soda per day   Social Determinants of Health   Financial Resource Strain: Not on file  Food Insecurity: Not on file  Transportation Needs: Not on file  Physical Activity: Not on file  Stress: Not on file  Social Connections: Not on file  Intimate Partner Violence: Not on file    FAMILY HISTORY:  Family History  Problem Relation Age of Onset   Diabetes Mother    Heart disease Mother    Heart disease Father 3   Dementia Sister    Heart disease Brother    Diabetes Sister    Heart disease Sister    Heart disease  Sister    Diabetes Sister    Arthritis Sister    Heart disease Sister    Heart disease Brother    Healthy Son    Healthy Son    Colon cancer Neg Hx     CURRENT MEDICATIONS:  Outpatient Encounter Medications as of 05/07/2022  Medication Sig   aspirin EC 81 MG tablet Take 81 mg by mouth daily.   calcium carbonate (OS-CAL - DOSED IN MG OF ELEMENTAL CALCIUM) 1250 (500 Ca) MG tablet Take 1 tablet by mouth every other day.    Cholecalciferol (D3-1000 PO) Take 1 capsule by mouth daily.   ferrous sulfate 325 (65 FE) MG tablet Take 325 mg by mouth at bedtime. Taking every other  day   fluticasone (FLONASE) 50 MCG/ACT nasal spray Place 1 spray into both nostrils daily as needed for allergies or rhinitis.   furosemide (LASIX) 40 MG tablet    ketoconazole (NIZORAL) 2 % cream Apply 1 application topically 2 (two) times daily as needed.    Menaquinone-7 (VITAMIN K2 PO) Take 1 tablet by mouth daily.   metoprolol succinate (TOPROL-XL) 100 MG 24 hr tablet Take 100 mg by mouth daily.   Oxcarbazepine (TRILEPTAL) 300 MG tablet TAKE 1 TABLET BY MOUTH EVERY MORNING AND 2 TABLETS EVERY NIGHT AT BEDTIME   Propylene Glycol 0.6 % SOLN Place 1 drop into both eyes 2 (two) times daily as needed (dry eyes).   simvastatin (ZOCOR) 20 MG tablet Take 20 mg by mouth at bedtime.    traMADol (ULTRAM) 50 MG tablet Take 50 mg by mouth every 12 (twelve) hours as needed.    No facility-administered encounter medications on file as of 05/07/2022.    ALLERGIES:  Allergies  Allergen Reactions   Feldene [Piroxicam]     Headaches    Lodine [Etodolac]     headaches    LABORATORY DATA:  I have reviewed the labs as listed.  CBC    Component Value Date/Time   WBC 5.6 11/03/2021 0906   RBC 3.72 (L) 11/03/2021 0906   HGB 11.4 (L) 11/03/2021 0906   HCT 35.2 (L) 11/03/2021 0906   PLT 199 11/03/2021 0906   MCV 94.6 11/03/2021 0906   MCH 30.6 11/03/2021 0906   MCHC 32.4 11/03/2021 0906   RDW 12.9 11/03/2021 0906    LYMPHSABS 1.9 11/03/2021 0906   MONOABS 0.7 11/03/2021 0906   EOSABS 0.2 11/03/2021 0906   BASOSABS 0.0 11/03/2021 0906      Latest Ref Rng & Units 10/18/2020    9:18 AM 07/13/2020   12:37 PM 04/13/2020   10:19 AM  CMP  Glucose 70 - 99 mg/dL 121   109   BUN 8 - 23 mg/dL 18   18   Creatinine 0.44 - 1.00 mg/dL 0.78  0.60  0.75   Sodium 135 - 145 mmol/L 141   141   Potassium 3.5 - 5.1 mmol/L 3.8   3.4   Chloride 98 - 111 mmol/L 103   102   CO2 22 - 32 mmol/L 30   32   Calcium 8.9 - 10.3 mg/dL 9.6   9.9   Total Protein 6.5 - 8.1 g/dL 7.0   7.2   Total Bilirubin 0.3 - 1.2 mg/dL 0.7   0.6   Alkaline Phos 38 - 126 U/L 68   69   AST 15 - 41 U/L 20   19   ALT 0 - 44 U/L 13   13     DIAGNOSTIC IMAGING:  I have independently reviewed the relevant imaging and discussed with the patient.   WRAP UP:  All questions were answered. The patient knows to call the clinic with any problems, questions or concerns.  Medical decision making: Low  Time spent on visit: I spent 15 minutes counseling the patient face to face. The total time spent in the appointment was 22 minutes and more than 50% was on counseling.  Sara Rush, PA-C  05/07/22 9:39 AM

## 2022-05-07 ENCOUNTER — Ambulatory Visit: Payer: Medicare HMO | Admitting: Physician Assistant

## 2022-05-07 ENCOUNTER — Inpatient Hospital Stay: Payer: Medicare HMO | Attending: Hematology

## 2022-05-07 ENCOUNTER — Other Ambulatory Visit: Payer: Self-pay

## 2022-05-07 ENCOUNTER — Inpatient Hospital Stay: Payer: Medicare HMO | Admitting: Physician Assistant

## 2022-05-07 ENCOUNTER — Other Ambulatory Visit: Payer: Medicare HMO

## 2022-05-07 VITALS — BP 167/79 | HR 85 | Temp 97.6°F | Resp 18 | Ht 62.0 in | Wt 149.8 lb

## 2022-05-07 DIAGNOSIS — D649 Anemia, unspecified: Secondary | ICD-10-CM

## 2022-05-07 DIAGNOSIS — D509 Iron deficiency anemia, unspecified: Secondary | ICD-10-CM

## 2022-05-07 DIAGNOSIS — Z87891 Personal history of nicotine dependence: Secondary | ICD-10-CM | POA: Insufficient documentation

## 2022-05-07 LAB — CBC WITH DIFFERENTIAL/PLATELET
Abs Immature Granulocytes: 0.02 10*3/uL (ref 0.00–0.07)
Basophils Absolute: 0 10*3/uL (ref 0.0–0.1)
Basophils Relative: 1 %
Eosinophils Absolute: 0.2 10*3/uL (ref 0.0–0.5)
Eosinophils Relative: 5 %
HCT: 35.8 % — ABNORMAL LOW (ref 36.0–46.0)
Hemoglobin: 11.9 g/dL — ABNORMAL LOW (ref 12.0–15.0)
Immature Granulocytes: 0 %
Lymphocytes Relative: 36 %
Lymphs Abs: 1.8 10*3/uL (ref 0.7–4.0)
MCH: 31.5 pg (ref 26.0–34.0)
MCHC: 33.2 g/dL (ref 30.0–36.0)
MCV: 94.7 fL (ref 80.0–100.0)
Monocytes Absolute: 0.6 10*3/uL (ref 0.1–1.0)
Monocytes Relative: 12 %
Neutro Abs: 2.2 10*3/uL (ref 1.7–7.7)
Neutrophils Relative %: 46 %
Platelets: 204 10*3/uL (ref 150–400)
RBC: 3.78 MIL/uL — ABNORMAL LOW (ref 3.87–5.11)
RDW: 12.8 % (ref 11.5–15.5)
WBC: 4.8 10*3/uL (ref 4.0–10.5)
nRBC: 0 % (ref 0.0–0.2)

## 2022-05-07 LAB — IRON AND TIBC
Iron: 73 ug/dL (ref 28–170)
Saturation Ratios: 21 % (ref 10.4–31.8)
TIBC: 349 ug/dL (ref 250–450)
UIBC: 276 ug/dL

## 2022-05-07 LAB — FERRITIN: Ferritin: 82 ng/mL (ref 11–307)

## 2022-05-07 NOTE — Patient Instructions (Signed)
[facility] **   You were seen today by [name] PA-C for your iron deficiency anemia.    IRON DEFICIENCY ANEMIA Your blood count remains mildly low but stable at its baseline. Your iron levels look great at today's visit! We will see you for repeat labs and follow-up visit in 6 months.  FOLLOW-UP APPOINTMENT: Labs in 6 months with office visit 1 week after  ** Thank you for trusting me with your healthcare!  I strive to provide all of my patients with quality care at each visit.  If you receive a survey for this visit, I would be so grateful to you for taking the time to provide feedback.  Thank you in advance!  ~ Shulamit Donofrio                   Dr. Derek Jack   &   [name], PA-C   - - - - - - - - - - - - - - - - - -    Thank you for choosing [place] at [facility] to provide your oncology and hematology care.  To afford each patient quality time with our provider, please arrive at least 15 minutes before your scheduled appointment time.   If you have a lab appointment with the [facility] please come in thru the Main Entrance and check in at the main information desk.  You need to re-schedule your appointment should you arrive 10 or more minutes late.  We strive to give you quality time with our providers, and arriving late affects you and other patients whose appointments are after yours.  Also, if you no show three or more times for appointments you may be dismissed from the clinic at the providers discretion.     Again, thank you for choosing [facility].  Our hope is that these requests will decrease the amount of time that you wait before being seen by our physicians.       _____________________________________________________________  Should you have questions after your visit to [facility], please contact our office at [phone] and follow the prompts.  Our office hours are 8:00 a.m. and 4:30 p.m. Monday - Friday.  Please note that voicemails left after 4:00 p.m. may not be returned until the following business day.  We are closed weekends and major holidays.  You do have access to a nurse 24-7, just call the main number to the clinic [phone] and do not press any options, hold on the line and a nurse will answer the phone.    For prescription refill requests, have your pharmacy contact our office and allow 72 hours.

## 2022-05-24 DIAGNOSIS — D518 Other vitamin B12 deficiency anemias: Secondary | ICD-10-CM | POA: Diagnosis not present

## 2022-06-27 DIAGNOSIS — D518 Other vitamin B12 deficiency anemias: Secondary | ICD-10-CM | POA: Diagnosis not present

## 2022-07-26 DIAGNOSIS — E538 Deficiency of other specified B group vitamins: Secondary | ICD-10-CM | POA: Diagnosis not present

## 2022-07-26 DIAGNOSIS — I10 Essential (primary) hypertension: Secondary | ICD-10-CM | POA: Diagnosis not present

## 2022-08-14 DIAGNOSIS — H52223 Regular astigmatism, bilateral: Secondary | ICD-10-CM | POA: Diagnosis not present

## 2022-08-24 DIAGNOSIS — D518 Other vitamin B12 deficiency anemias: Secondary | ICD-10-CM | POA: Diagnosis not present

## 2022-09-03 DIAGNOSIS — L218 Other seborrheic dermatitis: Secondary | ICD-10-CM | POA: Diagnosis not present

## 2022-09-03 DIAGNOSIS — D485 Neoplasm of uncertain behavior of skin: Secondary | ICD-10-CM | POA: Diagnosis not present

## 2022-09-03 DIAGNOSIS — L570 Actinic keratosis: Secondary | ICD-10-CM | POA: Diagnosis not present

## 2022-09-03 DIAGNOSIS — Z85828 Personal history of other malignant neoplasm of skin: Secondary | ICD-10-CM | POA: Diagnosis not present

## 2022-09-03 DIAGNOSIS — Z85820 Personal history of malignant melanoma of skin: Secondary | ICD-10-CM | POA: Diagnosis not present

## 2022-09-26 DIAGNOSIS — D518 Other vitamin B12 deficiency anemias: Secondary | ICD-10-CM | POA: Diagnosis not present

## 2022-10-24 DIAGNOSIS — D518 Other vitamin B12 deficiency anemias: Secondary | ICD-10-CM | POA: Diagnosis not present

## 2022-10-26 ENCOUNTER — Inpatient Hospital Stay: Payer: Medicare HMO | Attending: Hematology

## 2022-10-26 DIAGNOSIS — R5383 Other fatigue: Secondary | ICD-10-CM | POA: Insufficient documentation

## 2022-10-26 DIAGNOSIS — D509 Iron deficiency anemia, unspecified: Secondary | ICD-10-CM | POA: Insufficient documentation

## 2022-10-26 DIAGNOSIS — D649 Anemia, unspecified: Secondary | ICD-10-CM

## 2022-10-26 LAB — CBC WITH DIFFERENTIAL/PLATELET
Abs Immature Granulocytes: 0.03 10*3/uL (ref 0.00–0.07)
Basophils Absolute: 0 10*3/uL (ref 0.0–0.1)
Basophils Relative: 1 %
Eosinophils Absolute: 0.3 10*3/uL (ref 0.0–0.5)
Eosinophils Relative: 3 %
HCT: 30.6 % — ABNORMAL LOW (ref 36.0–46.0)
Hemoglobin: 10.2 g/dL — ABNORMAL LOW (ref 12.0–15.0)
Immature Granulocytes: 0 %
Lymphocytes Relative: 30 %
Lymphs Abs: 2.2 10*3/uL (ref 0.7–4.0)
MCH: 31.6 pg (ref 26.0–34.0)
MCHC: 33.3 g/dL (ref 30.0–36.0)
MCV: 94.7 fL (ref 80.0–100.0)
Monocytes Absolute: 1 10*3/uL (ref 0.1–1.0)
Monocytes Relative: 13 %
Neutro Abs: 3.9 10*3/uL (ref 1.7–7.7)
Neutrophils Relative %: 53 %
Platelets: 262 10*3/uL (ref 150–400)
RBC: 3.23 MIL/uL — ABNORMAL LOW (ref 3.87–5.11)
RDW: 13.2 % (ref 11.5–15.5)
WBC: 7.4 10*3/uL (ref 4.0–10.5)
nRBC: 0 % (ref 0.0–0.2)

## 2022-10-26 LAB — COMPREHENSIVE METABOLIC PANEL
ALT: 13 U/L (ref 0–44)
AST: 18 U/L (ref 15–41)
Albumin: 4 g/dL (ref 3.5–5.0)
Alkaline Phosphatase: 57 U/L (ref 38–126)
Anion gap: 9 (ref 5–15)
BUN: 16 mg/dL (ref 8–23)
CO2: 29 mmol/L (ref 22–32)
Calcium: 9.9 mg/dL (ref 8.9–10.3)
Chloride: 101 mmol/L (ref 98–111)
Creatinine, Ser: 0.87 mg/dL (ref 0.44–1.00)
GFR, Estimated: >60 mL/min (ref >60)
Glucose, Bld: 122 mg/dL — ABNORMAL HIGH (ref 70–99)
Potassium: 3.5 mmol/L (ref 3.5–5.1)
Sodium: 139 mmol/L (ref 135–145)
Total Bilirubin: 0.7 mg/dL (ref 0.3–1.2)
Total Protein: 7 g/dL (ref 6.5–8.1)

## 2022-10-26 LAB — FERRITIN: Ferritin: 22 ng/mL (ref 11–307)

## 2022-10-26 LAB — IRON AND TIBC
Iron: 50 ug/dL (ref 28–170)
Saturation Ratios: 12 % (ref 10.4–31.8)
TIBC: 420 ug/dL (ref 250–450)
UIBC: 370 ug/dL

## 2022-10-26 LAB — VITAMIN B12: Vitamin B-12: 5149 pg/mL — ABNORMAL HIGH (ref 180–914)

## 2022-10-29 ENCOUNTER — Other Ambulatory Visit: Payer: Medicare HMO

## 2022-10-30 LAB — METHYLMALONIC ACID, SERUM: Methylmalonic Acid, Quantitative: 167 nmol/L (ref 0–378)

## 2022-11-05 ENCOUNTER — Ambulatory Visit: Payer: Medicare HMO | Admitting: Physician Assistant

## 2022-11-07 ENCOUNTER — Ambulatory Visit: Payer: Medicare HMO | Admitting: Physician Assistant

## 2022-11-07 DIAGNOSIS — M75102 Unspecified rotator cuff tear or rupture of left shoulder, not specified as traumatic: Secondary | ICD-10-CM | POA: Diagnosis not present

## 2022-11-07 DIAGNOSIS — G9332 Myalgic encephalomyelitis/chronic fatigue syndrome: Secondary | ICD-10-CM | POA: Diagnosis not present

## 2022-11-07 DIAGNOSIS — M12812 Other specific arthropathies, not elsewhere classified, left shoulder: Secondary | ICD-10-CM | POA: Diagnosis not present

## 2022-11-07 DIAGNOSIS — Z6827 Body mass index (BMI) 27.0-27.9, adult: Secondary | ICD-10-CM | POA: Diagnosis not present

## 2022-11-07 DIAGNOSIS — E663 Overweight: Secondary | ICD-10-CM | POA: Diagnosis not present

## 2022-11-07 DIAGNOSIS — D518 Other vitamin B12 deficiency anemias: Secondary | ICD-10-CM | POA: Diagnosis not present

## 2022-11-07 DIAGNOSIS — Z0001 Encounter for general adult medical examination with abnormal findings: Secondary | ICD-10-CM | POA: Diagnosis not present

## 2022-11-07 DIAGNOSIS — I700 Atherosclerosis of aorta: Secondary | ICD-10-CM | POA: Diagnosis not present

## 2022-11-07 DIAGNOSIS — I10 Essential (primary) hypertension: Secondary | ICD-10-CM | POA: Diagnosis not present

## 2022-11-07 DIAGNOSIS — E782 Mixed hyperlipidemia: Secondary | ICD-10-CM | POA: Diagnosis not present

## 2022-11-07 DIAGNOSIS — E559 Vitamin D deficiency, unspecified: Secondary | ICD-10-CM | POA: Diagnosis not present

## 2022-11-07 DIAGNOSIS — M810 Age-related osteoporosis without current pathological fracture: Secondary | ICD-10-CM | POA: Diagnosis not present

## 2022-11-12 NOTE — Progress Notes (Unsigned)
[facility] [address], [place] [address]   CLINIC:  Medical Oncology/Hematology  PCP:  Elfredia Nevins, MD [address] [place] [address] [phone]   REASON FOR VISIT:  Follow-up for iron deficiency anemia   CURRENT THERAPY: Oral iron tablets and intermittent IV iron   INTERVAL HISTORY:   Ms. Sara Fischer 82 y.o. female returns for routine follow-up of her iron deficiency anemia.  She was last seen by Rojelio Brenner PA-C on 05/07/2022.  Last IV iron was on 12/15/2021.  Recent labs sent by PCP show ***.   At today's visit, she reports feeling ***.   No recent hospitalizations, surgeries, or changes in baseline health status.   ***She denies any frank hematochezia or obvious melena.   ***She denies any fatigue, pica, restless legs, headaches, lightheadedness, syncope.   ***She has not had any chest pain or dyspnea on exertion. *** She takes iron tablet daily.  *** She takes aspirin 81 mg daily.  *** *** She continues to remain active and goes dancing at least 4 nights per week, and also enjoys spending time with her great-grandchildren.   *** She has ***% energy and ***% appetite. She endorses that she is maintaining a stable weight.  ASSESSMENT & PLAN:  1.  Normocytic anemia with iron deficiency - Referred by primary care provider (Dr. Sherwood Gambler) in March 2022 - CBC on 04/28/2019 at Dr. Sharyon Medicus office showed hemoglobin 9.5, MCV of 89. White count was slightly low at 3.4 and platelets were 229. Differential was normal.  Ferritin was 15.  Normal B12 and folic acid. Percent saturation was 9.0, reticulocyte was 2% - Colonoscopy on 02/29/2016 showed many small and large mouth diverticula in the entire colon. Grade 1 internal hemorrhoids. - SPEP negative, LDH normal, stool occult blood normal.  Normal creatinine without evidence of kidney dysfunction. - She takes iron tablet daily.  *** - Most recent IV iron with Venofer on 12/15/2021*** - Most recent  labs (10/26/2022): Hgb 10.2, ferritin 22, iron saturation 12%. Vitamin B12 elevated 5149 mL MMA. Normal creatinine 0.87/GFR >60 Labs from PCP (11/08/2022) showed normal folate 16.3 - Labs from PCP (11/08/2022) show worsening Hgb 8.4/MCV 93.0. - No bright red blood per rectum or melena  *** Stool cards completed PCP last year reportedly normal. - Symptoms *** - PLAN: Repeat CBC + BB sample *** - Stool cards x 3.  *** Referral to GI *** - Venofer 400 mg x 3 *** - Repeat CBC and iron panel with RTC in 6 weeks ***  PLAN SUMMARY: >> *** add for labs today (CBC/BB sample) >> *** GI referral??  *** >> Stool cards x 3 >> Venofer 400 mg x 3 >> Same-day labs (CBC/D, BB sample, ferritin, iron/TIBC) + OFFICE visit in 6 weeks ***    REVIEW OF SYSTEMS: ***  Review of Systems  Constitutional:  Negative for appetite change, chills, diaphoresis, fatigue, fever and unexpected weight change.  HENT:   Negative for lump/mass and nosebleeds.   Eyes:  Negative for eye problems.  Respiratory:  Negative for cough, hemoptysis and shortness of breath.   Cardiovascular:  Negative for chest pain, leg swelling and palpitations.  Gastrointestinal:  Positive for constipation. Negative for abdominal pain, blood in stool, diarrhea, nausea and vomiting.  Genitourinary:  Negative for hematuria.   Skin: Negative.   Neurological:  Positive for numbness (fingertips). Negative for dizziness, headaches and light-headedness.  Hematological:  Does not bruise/bleed easily.     PHYSICAL EXAM:  ECOG  PERFORMANCE STATUS: 0 - Asymptomatic *** There were no vitals filed for this visit. There were no vitals filed for this visit. Physical Exam Constitutional:      Appearance: Normal appearance. She is normal weight.  Cardiovascular:     Heart sounds: Normal heart sounds.  Pulmonary:     Breath sounds: Normal breath sounds.  Neurological:     General: No focal deficit present.     Mental Status: Mental status is at  baseline.  Psychiatric:        Behavior: Behavior normal. Behavior is cooperative.     PAST MEDICAL/SURGICAL HISTORY:  Past Medical History:  Diagnosis Date   High cholesterol    Hypertension    Past Surgical History:  Procedure Laterality Date   ABDOMINAL HYSTERECTOMY     APPENDECTOMY     COLONOSCOPY  2010   Dr. Jena Gauss: pancolonic diverticulosis, sigmoid colon tubular adenoma removed.    COLONOSCOPY N/A 02/29/2016   Procedure: COLONOSCOPY;  Surgeon: Corbin Ade, MD;  [facility];  Service: Endoscopy;  Laterality: N/A;  230   INCISIONAL HERNIA REPAIR N/A 12/28/2016   Procedure: HERNIA REPAIR INCISIONAL;  Surgeon: Franky Macho, MD;  Location: [facility];  Service: General;  Laterality: N/A;   KNEE ARTHROSCOPY     bilateral   LAPAROSCOPIC BILATERAL SALPINGO OOPHERECTOMY  2001   PARTIAL HYSTERECTOMY  1979    SOCIAL HISTORY:  Social History   Socioeconomic History   Marital status: Widowed    Spouse name: Not on file   Number of children: 4   Years of education: Not on file   Highest education level: Not on file  Occupational History   Occupation: Retired  Tobacco Use   Smoking status: Former    Current packs/day: 0.00    Average packs/day: 0.5 packs/day for 5.2 years (2.6 ttl pk-yrs)    Types: Cigarettes    Start date: 08/28/1963    Quit date: 11/02/1968    Years since quitting: 54.0   Smokeless tobacco: Never  Vaping Use   Vaping status: Never Used  Substance and Sexual Activity   Alcohol use: Yes    Alcohol/week: 0.0 standard drinks of alcohol    Comment: wine occas.   Drug use: No   Sexual activity: Not on file  Other Topics Concern   Not on file  Social History Narrative   Right handed    Caffeine use: 1 cup coffee every morning   1 soda per day   Social Determinants of Health   Financial Resource Strain: Not on file  Food Insecurity: Not on file  Transportation Needs: Not on file  Physical Activity: Not on file  Stress: Not on file   Social Connections: Not on file  Intimate Partner Violence: Not on file    FAMILY HISTORY:  Family History  Problem Relation Age of Onset   Diabetes Mother    Heart disease Mother    Heart disease Father 57   Dementia Sister    Heart disease Brother    Diabetes Sister    Heart disease Sister    Heart disease Sister    Diabetes Sister    Arthritis Sister    Heart disease Sister    Heart disease Brother    Healthy Son    Healthy Son    Colon cancer Neg Hx     CURRENT MEDICATIONS:  Outpatient Encounter Medications as of 11/13/2022  Medication Sig   aspirin EC 81 MG tablet Take 81 mg by  mouth daily.   calcium carbonate (OS-CAL - DOSED IN MG OF ELEMENTAL CALCIUM) 1250 (500 Ca) MG tablet Take 1 tablet by mouth every other day.    Cholecalciferol (D3-1000 PO) Take 1 capsule by mouth daily.   ferrous sulfate 325 (65 FE) MG tablet Take 325 mg by mouth at bedtime. Taking every other day   fluticasone (FLONASE) 50 MCG/ACT nasal spray Place 1 spray into both nostrils daily as needed for allergies or rhinitis.   furosemide (LASIX) 40 MG tablet    ketoconazole (NIZORAL) 2 % cream Apply 1 application topically 2 (two) times daily as needed.    Menaquinone-7 (VITAMIN K2 PO) Take 1 tablet by mouth daily.   metoprolol succinate (TOPROL-XL) 100 MG 24 hr tablet Take 100 mg by mouth daily.   Oxcarbazepine (TRILEPTAL) 300 MG tablet TAKE 1 TABLET BY MOUTH EVERY MORNING AND 2 TABLETS EVERY NIGHT AT BEDTIME   Propylene Glycol 0.6 % SOLN Place 1 drop into both eyes 2 (two) times daily as needed (dry eyes).   simvastatin (ZOCOR) 20 MG tablet Take 20 mg by mouth at bedtime.    traMADol (ULTRAM) 50 MG tablet Take 50 mg by mouth every 12 (twelve) hours as needed.    No facility-administered encounter medications on file as of 11/13/2022.    ALLERGIES:  Allergies  Allergen Reactions   Feldene [Piroxicam]     Headaches    Lodine [Etodolac]     headaches    LABORATORY DATA:  I have reviewed the  labs as listed.  CBC    Component Value Date/Time   WBC 7.4 10/26/2022 1046   RBC 3.23 (L) 10/26/2022 1046   HGB 10.2 (L) 10/26/2022 1046   HCT 30.6 (L) 10/26/2022 1046   PLT 262 10/26/2022 1046   MCV 94.7 10/26/2022 1046   MCH 31.6 10/26/2022 1046   MCHC 33.3 10/26/2022 1046   RDW 13.2 10/26/2022 1046   LYMPHSABS 2.2 10/26/2022 1046   MONOABS 1.0 10/26/2022 1046   EOSABS 0.3 10/26/2022 1046   BASOSABS 0.0 10/26/2022 1046      Latest Ref Rng & Units 10/26/2022   10:46 AM 10/18/2020    9:18 AM 07/13/2020   12:37 PM  CMP  Glucose 70 - 99 mg/dL 960  454    BUN 8 - 23 mg/dL 16  18    Creatinine 0.98 - 1.00 mg/dL 1.19  1.47  8.29   Sodium 135 - 145 mmol/L 139  141    Potassium 3.5 - 5.1 mmol/L 3.5  3.8    Chloride 98 - 111 mmol/L 101  103    CO2 22 - 32 mmol/L 29  30    Calcium 8.9 - 10.3 mg/dL 9.9  9.6    Total Protein 6.5 - 8.1 g/dL 7.0  7.0    Total Bilirubin 0.3 - 1.2 mg/dL 0.7  0.7    Alkaline Phos 38 - 126 U/L 57  68    AST 15 - 41 U/L 18  20    ALT 0 - 44 U/L 13  13      DIAGNOSTIC IMAGING:  I have independently reviewed the relevant imaging and discussed with the patient.   WRAP UP:  All questions were answered. The patient knows to call the clinic with any problems, questions or concerns.  Medical decision making: Low  Time spent on visit: I spent 15 minutes counseling the patient face to face. The total time spent in the appointment was 22 minutes and more than  50% was on counseling.  Carnella Guadalajara, PA-C  ***

## 2022-11-13 ENCOUNTER — Encounter: Payer: Self-pay | Admitting: Physician Assistant

## 2022-11-13 ENCOUNTER — Inpatient Hospital Stay: Payer: Medicare HMO | Attending: Physician Assistant | Admitting: Physician Assistant

## 2022-11-13 ENCOUNTER — Other Ambulatory Visit: Payer: Self-pay | Admitting: Physician Assistant

## 2022-11-13 ENCOUNTER — Inpatient Hospital Stay: Payer: Medicare HMO

## 2022-11-13 DIAGNOSIS — D509 Iron deficiency anemia, unspecified: Secondary | ICD-10-CM | POA: Diagnosis not present

## 2022-11-13 DIAGNOSIS — D649 Anemia, unspecified: Secondary | ICD-10-CM | POA: Diagnosis not present

## 2022-11-13 LAB — CBC
HCT: 29.5 % — ABNORMAL LOW (ref 36.0–46.0)
Hemoglobin: 9.5 g/dL — ABNORMAL LOW (ref 12.0–15.0)
MCH: 30.9 pg (ref 26.0–34.0)
MCHC: 32.2 g/dL (ref 30.0–36.0)
MCV: 96.1 fL (ref 80.0–100.0)
Platelets: 305 10*3/uL (ref 150–400)
RBC: 3.07 MIL/uL — ABNORMAL LOW (ref 3.87–5.11)
RDW: 13.4 % (ref 11.5–15.5)
WBC: 6 10*3/uL (ref 4.0–10.5)
nRBC: 0 % (ref 0.0–0.2)

## 2022-11-13 LAB — SAMPLE TO BLOOD BANK

## 2022-11-13 NOTE — Patient Instructions (Signed)
[facility] at [facility] **VISIT SUMMARY & IMPORTANT INSTRUCTIONS **   You were seen today by Rojelio Brenner PA-C for your iron deficiency anemia.   Your blood and iron levels are much lower than what they should be. This is suspicious for possible bleeding from your stomach or intestines. We will check "stool cards" x 3 to see if you have any blood in your bowel movements. We have sent referral for you to see Dr. Jena Gauss (gastroenterology) to discuss possible EGD/colonoscopy to evaluate for internal bleeding. We will schedule you for 3 doses of IV iron. Will recheck labs and see you for follow-up visit in 2 months.   ** Thank you for trusting me with your healthcare!  I strive to provide all of my patients with quality care at each visit.  If you receive a survey for this visit, I would be so grateful to you for taking the time to provide feedback.  Thank you in advance!  ~ Jung Ingerson                   Dr. Doreatha Massed   &   Rojelio Brenner, PA-C   - - - - - - - - - - - - - - - - - -    Thank you for choosing [facility] at [facility] to provide your oncology and hematology care.  To afford each patient quality time with our provider, please arrive at least 15 minutes before your scheduled appointment time.   If you have a lab appointment with the Cancer Center please come in thru the Main Entrance and check in at the main information desk.  You need to re-schedule your appointment should you arrive 10 or more minutes late.  We strive to give you quality time with our providers, and arriving late affects you and other patients whose appointments are after yours.  Also, if you no show three or more times for appointments you may be dismissed from the clinic at the providers discretion.     Again, thank you for choosing [facility].  Our hope is that these requests will decrease the amount of time that you wait before being  seen by our physicians.       _____________________________________________________________  Should you have questions after your visit to [facility], please contact our office at [phone] and follow the prompts.  Our office hours are 8:00 a.m. and 4:30 p.m. Monday - Friday.  Please note that voicemails left after 4:00 p.m. may not be returned until the following business day.  We are closed weekends and major holidays.  You do have access to a nurse 24-7, just call the main number to the clinic [phone] and do not press any options, hold on the line and a nurse will answer the phone.    For prescription refill requests, have your pharmacy contact our office and allow 72 hours.

## 2022-11-15 ENCOUNTER — Inpatient Hospital Stay: Payer: Medicare HMO

## 2022-11-15 VITALS — BP 159/59 | HR 73 | Temp 96.7°F | Resp 18

## 2022-11-15 DIAGNOSIS — D649 Anemia, unspecified: Secondary | ICD-10-CM

## 2022-11-15 DIAGNOSIS — D509 Iron deficiency anemia, unspecified: Secondary | ICD-10-CM | POA: Diagnosis not present

## 2022-11-15 MED ORDER — SODIUM CHLORIDE 0.9 % IV SOLN: Freq: Once | INTRAVENOUS | Status: AC

## 2022-11-15 MED ORDER — SODIUM CHLORIDE 0.9 % IV SOLN
400.0000 mg | Freq: Once | INTRAVENOUS | Status: AC
  Administered 2022-11-15: 400 mg via INTRAVENOUS
  Filled 2022-11-15: qty 400

## 2022-11-15 NOTE — Progress Notes (Signed)
Patient presents today for Venofer 400 mg iron infusion. Vital signs stable. Patient has no complaints of any side effects related to last infusion. MAR reviewed.   Venofer given today per MD orders. Tolerated infusion without adverse affects. Vital signs stable. No complaints at this time. Discharged from clinic ambulatory in stable condition. Alert and oriented x 3. F/U with [facility] as scheduled.

## 2022-11-15 NOTE — Progress Notes (Signed)
Tylenol and Claritin taken at 0955 at home prior to visit.

## 2022-11-15 NOTE — Patient Instructions (Signed)
 [facility]  Discharge Instructions: Thank you for choosing [facility] to provide your oncology and hematology care.  If you have a lab appointment with the Cancer Center - please note that after April 8th, 2024, all labs will be drawn in the cancer center.  You do not have to check in or register with the main entrance as you have in the past but will complete your check-in in the cancer center.  Wear comfortable clothing and clothing appropriate for easy access to any Portacath or PICC line.   We strive to give you quality time with your provider. You may need to reschedule your appointment if you arrive late (15 or more minutes).  Arriving late affects you and other patients whose appointments are after yours.  Also, if you miss three or more appointments without notifying the office, you may be dismissed from the clinic at the provider's discretion.      For prescription refill requests, have your pharmacy contact our office and allow 72 hours for refills to be completed.    Today you received the following chemotherapy and/or immunotherapy agents Venofer. Iron Sucrose Injection What is this medication? IRON SUCROSE (EYE ern SOO krose) treats low levels of iron (iron deficiency anemia) in people with kidney disease. Iron is a mineral that plays an important role in making red blood cells, which carry oxygen from your lungs to the rest of your body. This medicine may be used for other purposes; ask your health care provider or pharmacist if you have questions. COMMON BRAND NAME(S): Venofer What should I tell my care team before I take this medication? They need to know if you have any of these conditions: Anemia not caused by low iron levels Heart disease High levels of iron in the blood Kidney disease Liver disease An unusual or allergic reaction to iron, other medications, foods, dyes, or preservatives Pregnant or trying to get  pregnant Breastfeeding How should I use this medication? This medication is for infusion into a vein. It is given in a hospital or clinic setting. Talk to your care team about the use of this medication in children. While this medication may be prescribed for children as young as 2 years for selected conditions, precautions do apply. Overdosage: If you think you have taken too much of this medicine contact a poison control center or emergency room at once. NOTE: This medicine is only for you. Do not share this medicine with others. What if I miss a dose? Keep appointments for follow-up doses. It is important not to miss your dose. Call your care team if you are unable to keep an appointment. What may interact with this medication? Do not take this medication with any of the following: Deferoxamine Dimercaprol Other iron products This medication may also interact with the following: Chloramphenicol Deferasirox This list may not describe all possible interactions. Give your health care provider a list of all the medicines, herbs, non-prescription drugs, or dietary supplements you use. Also tell them if you smoke, drink alcohol, or use illegal drugs. Some items may interact with your medicine. What should I watch for while using this medication? Visit your care team regularly. Tell your care team if your symptoms do not start to get better or if they get worse. You may need blood work done while you are taking this medication. You may need to follow a special diet. Talk to your care team. Foods that contain iron include: whole grains/cereals, dried  fruits, beans, or peas, leafy green vegetables, and organ meats (liver, kidney). What side effects may I notice from receiving this medication? Side effects that you should report to your care team as soon as possible: Allergic reactions--skin rash, itching, hives, swelling of the face, lips, tongue, or throat Low blood pressure--dizziness, feeling  faint or lightheaded, blurry vision Shortness of breath Side effects that usually do not require medical attention (report to your care team if they continue or are bothersome): Flushing Headache Joint pain Muscle pain Nausea Pain, redness, or irritation at injection site This list may not describe all possible side effects. Call your doctor for medical advice about side effects. You may report side effects to FDA at 1-800-FDA-1088. Where should I keep my medication? This medication is given in a hospital or clinic. It will not be stored at home. NOTE: This sheet is a summary. It may not cover all possible information. If you have questions about this medicine, talk to your doctor, pharmacist, or health care provider.  2024 Elsevier/Gold Standard (2022-08-03 00:00:00)       To help prevent nausea and vomiting after your treatment, we encourage you to take your nausea medication as directed.  BELOW ARE SYMPTOMS THAT SHOULD BE REPORTED IMMEDIATELY: *FEVER GREATER THAN 100.4 F (38 C) OR HIGHER *CHILLS OR SWEATING *NAUSEA AND VOMITING THAT IS NOT CONTROLLED WITH YOUR NAUSEA MEDICATION *UNUSUAL SHORTNESS OF BREATH *UNUSUAL BRUISING OR BLEEDING *URINARY PROBLEMS (pain or burning when urinating, or frequent urination) *BOWEL PROBLEMS (unusual diarrhea, constipation, pain near the anus) TENDERNESS IN MOUTH AND THROAT WITH OR WITHOUT PRESENCE OF ULCERS (sore throat, sores in mouth, or a toothache) UNUSUAL RASH, SWELLING OR PAIN  UNUSUAL VAGINAL DISCHARGE OR ITCHING   Items with * indicate a potential emergency and should be followed up as soon as possible or go to the Emergency Department if any problems should occur.  Please show the CHEMOTHERAPY ALERT CARD or IMMUNOTHERAPY ALERT CARD at check-in to the Emergency Department and triage nurse.  Should you have questions after your visit or need to cancel or reschedule your appointment, please contact [facility] AT [facility]  [phone]  and follow the prompts.  Office hours are 8:00 a.m. to 4:30 p.m. Monday - Friday. Please note that voicemails left after 4:00 p.m. may not be returned until the following business day.  We are closed weekends and major holidays. You have access to a nurse at all times for urgent questions. Please call the main number to the clinic [phone] and follow the prompts.  For any non-urgent questions, you may also contact your provider using MyChart. We now offer e-Visits for anyone 9 and older to request care online for non-urgent symptoms. For details visit [email or web address].   Also download the MyChart app! Go to the app store, search "MyChart", open the app, select [place], and log in with your MyChart username and password.

## 2022-11-20 ENCOUNTER — Other Ambulatory Visit: Payer: Self-pay

## 2022-11-20 ENCOUNTER — Telehealth: Payer: Self-pay | Admitting: *Deleted

## 2022-11-20 DIAGNOSIS — D509 Iron deficiency anemia, unspecified: Secondary | ICD-10-CM | POA: Diagnosis not present

## 2022-11-20 LAB — OCCULT BLOOD X 1 CARD TO LAB, STOOL
Fecal Occult Bld: POSITIVE — AB
Fecal Occult Bld: POSITIVE — AB
Fecal Occult Bld: POSITIVE — AB

## 2022-11-20 NOTE — Telephone Encounter (Signed)
Patient made aware of stool cards + x 3.  She has follow up with GI on Thursday.  Dr. Ellin Saba and Dr. Jena Gauss both made aware.

## 2022-11-20 NOTE — Progress Notes (Signed)
Referring Provider:Pennington, Rushie Goltz, PA-C  Primary Care Physician:  Elfredia Nevins, MD Primary Gastroenterologist:  Dr. Jena Gauss  No chief complaint on file.   HPI:   Sara Fischer is a 82 y.o. female presenting today at the request of Pennington, Rebekah M, PA-C for iron deficiency anemia.   Per chart review, patient has chronic, normocytic anemia with baseline hemoglobin in the 10-11 range.  Reviewed hematology notes.  Patient had low iron saturation of 9% in February 2021 and has been receiving Venofer intermittently.  Hemoglobin declined to 8.4 on 11/08/2022 on labs completed with PCP.  Repeat labs on 11/13/2022 with hemoglobin improved to 9.5.  Recommendation by hematology on 9/3 was to complete 3 stool cards, referred to GI, Venofer 400 mg x 3, repeat CBC and iron panel in 8 weeks.  Received first of 3 Venofer 11/15/2022. Hemoccult positive x 3 on 11/20/2022.  Last colonoscopy 02/29/2016 for heme positive stool with pancolonic diverticulosis, grade 1 internal hemorrhoids.  Today:   Past Medical History:  Diagnosis Date   High cholesterol    Hypertension     Past Surgical History:  Procedure Laterality Date   ABDOMINAL HYSTERECTOMY     APPENDECTOMY     COLONOSCOPY  2010   Dr. Jena Gauss: pancolonic diverticulosis, sigmoid colon tubular adenoma removed.    COLONOSCOPY N/A 02/29/2016   Procedure: COLONOSCOPY;  Surgeon: Corbin Ade, MD;  Location: [facility];  Service: Endoscopy;  Laterality: N/A;  230   INCISIONAL HERNIA REPAIR N/A 12/28/2016   Procedure: HERNIA REPAIR INCISIONAL;  Surgeon: Franky Macho, MD;  Location: [facility] ORS;  Service: General;  Laterality: N/A;   KNEE ARTHROSCOPY     bilateral   LAPAROSCOPIC BILATERAL SALPINGO OOPHERECTOMY  2001   PARTIAL HYSTERECTOMY  1979    Current Outpatient Medications  Medication Sig Dispense Refill   aspirin EC 81 MG tablet Take 81 mg by mouth daily.     calcium carbonate (OS-CAL - DOSED IN MG OF ELEMENTAL CALCIUM) 1250  (500 Ca) MG tablet Take 1 tablet by mouth every other day.      Cholecalciferol (D3-1000 PO) Take 1 capsule by mouth daily.     ferrous sulfate 325 (65 FE) MG tablet Take 325 mg by mouth at bedtime. Taking every other day     fluticasone (FLONASE) 50 MCG/ACT nasal spray Place 1 spray into both nostrils daily as needed for allergies or rhinitis.     furosemide (LASIX) 40 MG tablet      ketoconazole (NIZORAL) 2 % cream Apply 1 application topically 2 (two) times daily as needed.      Menaquinone-7 (VITAMIN K2 PO) Take 1 tablet by mouth daily.     metoprolol succinate (TOPROL-XL) 100 MG 24 hr tablet Take 100 mg by mouth daily.     Oxcarbazepine (TRILEPTAL) 300 MG tablet TAKE 1 TABLET BY MOUTH EVERY MORNING AND 2 TABLETS EVERY NIGHT AT BEDTIME 90 tablet 0   Propylene Glycol 0.6 % SOLN Place 1 drop into both eyes 2 (two) times daily as needed (dry eyes).     simvastatin (ZOCOR) 20 MG tablet Take 20 mg by mouth at bedtime.      traMADol (ULTRAM) 50 MG tablet Take 50 mg by mouth every 12 (twelve) hours as needed.      No current facility-administered medications for this visit.    Allergies as of 11/22/2022 - Review Complete 11/15/2022  Allergen Reaction Noted   Feldene [piroxicam]  11/02/2013   Lodine [etodolac]  11/08/2010  Family History  Problem Relation Age of Onset   Diabetes Mother    Heart disease Mother    Heart disease Father 31   Dementia Sister    Heart disease Brother    Diabetes Sister    Heart disease Sister    Heart disease Sister    Diabetes Sister    Arthritis Sister    Heart disease Sister    Heart disease Brother    Healthy Son    Healthy Son    Colon cancer Neg Hx     Social History   Socioeconomic History   Marital status: Widowed    Spouse name: Not on file   Number of children: 4   Years of education: Not on file   Highest education level: Not on file  Occupational History   Occupation: Retired  Tobacco Use   Smoking status: Former    Current  packs/day: 0.00    Average packs/day: 0.5 packs/day for 5.2 years (2.6 ttl pk-yrs)    Types: Cigarettes    Start date: 08/28/1963    Quit date: 11/02/1968    Years since quitting: 54.0   Smokeless tobacco: Never  Vaping Use   Vaping status: Never Used  Substance and Sexual Activity   Alcohol use: Yes    Alcohol/week: 0.0 standard drinks of alcohol    Comment: wine occas.   Drug use: No   Sexual activity: Not on file  Other Topics Concern   Not on file  Social History Narrative   Right handed    Caffeine use: 1 cup coffee every morning   1 soda per day   Social Determinants of Health   Financial Resource Strain: Not on file  Food Insecurity: Not on file  Transportation Needs: Not on file  Physical Activity: Not on file  Stress: Not on file  Social Connections: Not on file  Intimate Partner Violence: Not on file    Review of Systems: Gen: Denies any fever, chills, fatigue, weight loss, lack of appetite.  CV: Denies chest pain, heart palpitations, peripheral edema, syncope.  Resp: Denies shortness of breath at rest or with exertion. Denies wheezing or cough.  GI: Denies dysphagia or odynophagia. Denies jaundice, hematemesis, fecal incontinence. GU : Denies urinary burning, urinary frequency, urinary hesitancy MS: Denies joint pain, muscle weakness, cramps, or limitation of movement.  Derm: Denies rash, itching, dry skin Psych: Denies depression, anxiety, memory loss, and confusion Heme: Denies bruising, bleeding, and enlarged lymph nodes.  Physical Exam: There were no vitals taken for this visit. General:   Alert and oriented. Pleasant and cooperative. Well-nourished and well-developed.  Head:  Normocephalic and atraumatic. Eyes:  Without icterus, sclera clear and conjunctiva pink.  Ears:  Normal auditory acuity. Lungs:  Clear to auscultation bilaterally. No wheezes, rales, or rhonchi. No distress.  Heart:  S1, S2 present without murmurs appreciated.  Abdomen:  +BS,  soft, non-tender and non-distended. No HSM noted. No guarding or rebound. No masses appreciated.  Rectal:  Deferred  Msk:  Symmetrical without gross deformities. Normal posture. Extremities:  Without edema. Neurologic:  Alert and  oriented x4;  grossly normal neurologically. Skin:  Intact without significant lesions or rashes. Psych:  Alert and cooperative. Normal mood and affect.    Assessment:     Plan:  ***   Ermalinda Memos, PA-C [facility] 11/22/2022

## 2022-11-22 ENCOUNTER — Ambulatory Visit: Payer: Medicare HMO | Admitting: Gastroenterology

## 2022-11-22 ENCOUNTER — Encounter: Payer: Self-pay | Admitting: Gastroenterology

## 2022-11-22 VITALS — BP 142/76 | HR 79 | Temp 97.6°F | Ht 62.0 in | Wt 151.0 lb

## 2022-11-22 DIAGNOSIS — D509 Iron deficiency anemia, unspecified: Secondary | ICD-10-CM

## 2022-11-22 DIAGNOSIS — K5900 Constipation, unspecified: Secondary | ICD-10-CM

## 2022-11-22 NOTE — Patient Instructions (Signed)
We will get you scheduled for an upper endoscopy and colonoscopy in the near future with Dr. Jena Gauss.  For constipation: Stop stool softeners and start MiraLAX 1 capful (17 g) daily in 8 ounces of water or other noncarbonated beverage of your choice. Let me know if you continue to struggle with constipation despite MiraLAX. As we discussed, it is important that your bowels are moving very well prior to your colonoscopy to ensure that you have a good prep.  I will plan to see you back in the office after your procedures.  Do not hesitate to call sooner if you have questions or concerns.  It was very nice to meet you today!  Ermalinda Memos, PA-C [facility]

## 2022-11-23 ENCOUNTER — Inpatient Hospital Stay: Payer: Medicare HMO

## 2022-11-23 VITALS — BP 185/68 | HR 71 | Temp 98.5°F | Resp 16

## 2022-11-23 DIAGNOSIS — D649 Anemia, unspecified: Secondary | ICD-10-CM

## 2022-11-23 DIAGNOSIS — D509 Iron deficiency anemia, unspecified: Secondary | ICD-10-CM | POA: Diagnosis not present

## 2022-11-23 MED ORDER — SODIUM CHLORIDE 0.9 % IV SOLN
400.0000 mg | Freq: Once | INTRAVENOUS | Status: AC
  Administered 2022-11-23: 400 mg via INTRAVENOUS
  Filled 2022-11-23: qty 400

## 2022-11-23 MED ORDER — SODIUM CHLORIDE 0.9 % IV SOLN: Freq: Once | INTRAVENOUS | Status: AC

## 2022-11-23 NOTE — Patient Instructions (Signed)
[facility]  Discharge Instructions: Thank you for choosing [facility] to provide your oncology and hematology care.  If you have a lab appointment with the Cancer Center - please note that after April 8th, 2024, all labs will be drawn in the cancer center.  You do not have to check in or register with the main entrance as you have in the past but will complete your check-in in the cancer center.  Wear comfortable clothing and clothing appropriate for easy access to any Portacath or PICC line.   We strive to give you quality time with your provider. You may need to reschedule your appointment if you arrive late (15 or more minutes).  Arriving late affects you and other patients whose appointments are after yours.  Also, if you miss three or more appointments without notifying the office, you may be dismissed from the clinic at the provider's discretion.      For prescription refill requests, have your pharmacy contact our office and allow 72 hours for refills to be completed.    Today you received the following iron infusion, Venofer   To help prevent nausea and vomiting after your treatment, we encourage you to take your nausea medication as directed.  BELOW ARE SYMPTOMS THAT SHOULD BE REPORTED IMMEDIATELY: *FEVER GREATER THAN 100.4 F (38 C) OR HIGHER *CHILLS OR SWEATING *NAUSEA AND VOMITING THAT IS NOT CONTROLLED WITH YOUR NAUSEA MEDICATION *UNUSUAL SHORTNESS OF BREATH *UNUSUAL BRUISING OR BLEEDING *URINARY PROBLEMS (pain or burning when urinating, or frequent urination) *BOWEL PROBLEMS (unusual diarrhea, constipation, pain near the anus) TENDERNESS IN MOUTH AND THROAT WITH OR WITHOUT PRESENCE OF ULCERS (sore throat, sores in mouth, or a toothache) UNUSUAL RASH, SWELLING OR PAIN  UNUSUAL VAGINAL DISCHARGE OR ITCHING   Items with * indicate a potential emergency and should be followed up as soon as possible or go to the Emergency Department if any  problems should occur.  Please show the CHEMOTHERAPY ALERT CARD or IMMUNOTHERAPY ALERT CARD at check-in to the Emergency Department and triage nurse.  Should you have questions after your visit or need to cancel or reschedule your appointment, please contact [facility]ENTER AT [facility] [phone]  and follow the prompts.  Office hours are 8:00 a.m. to 4:30 p.m. Monday - Friday. Please note that voicemails left after 4:00 p.m. may not be returned until the following business day.  We are closed weekends and major holidays. You have access to a nurse at all times for urgent questions. Please call the main number to the clinic [phone] and follow the prompts.  For any non-urgent questions, you may also contact your provider using MyChart. We now offer e-Visits for anyone 56 and older to request care online for non-urgent symptoms. For details visit [email or web address].   Also download the MyChart app! Go to the app store, search "MyChart", open the app, select [place], and log in with your MyChart username and password.

## 2022-11-23 NOTE — Progress Notes (Signed)
Patient took her tylenol and zyrtec this am.   Venofer 400 mg infusion given per orders. Patient tolerated it well without problems. Vitals stable and discharged home from clinic ambulatory. Follow up as scheduled.

## 2022-11-26 ENCOUNTER — Telehealth: Payer: Self-pay | Admitting: *Deleted

## 2022-11-26 DIAGNOSIS — E538 Deficiency of other specified B group vitamins: Secondary | ICD-10-CM | POA: Diagnosis not present

## 2022-11-26 DIAGNOSIS — Z23 Encounter for immunization: Secondary | ICD-10-CM | POA: Diagnosis not present

## 2022-11-26 NOTE — Telephone Encounter (Signed)
Patient left a message that she was returning your call.

## 2022-11-26 NOTE — Telephone Encounter (Signed)
LMOVM to call back to schedule TCS/EGD with Dr. Jena Gauss ASA 3, Linzess x 4 days prior to prep, wants smallest prep possible. Will need to make sure she has linzess sample and also will need to give clenpiq sample

## 2022-11-27 ENCOUNTER — Encounter: Payer: Self-pay | Admitting: *Deleted

## 2022-11-27 NOTE — Telephone Encounter (Signed)
Spoke with pt. She has been schedled for 10/16. She will come by office to pick up linzess and prep sample with instructions/pre-op appt.  PA approved via cohere. Authorization #[id number]. DOS: 12/26/2022 - 02/25/2023

## 2022-11-30 ENCOUNTER — Inpatient Hospital Stay: Payer: Medicare HMO

## 2022-11-30 VITALS — BP 153/48 | HR 63 | Temp 97.1°F | Resp 18

## 2022-11-30 DIAGNOSIS — D649 Anemia, unspecified: Secondary | ICD-10-CM

## 2022-11-30 DIAGNOSIS — D509 Iron deficiency anemia, unspecified: Secondary | ICD-10-CM | POA: Diagnosis not present

## 2022-11-30 MED ORDER — SODIUM CHLORIDE 0.9 % IV SOLN: Freq: Once | INTRAVENOUS | Status: AC

## 2022-11-30 MED ORDER — SODIUM CHLORIDE 0.9 % IV SOLN
400.0000 mg | Freq: Once | INTRAVENOUS | Status: AC
  Administered 2022-11-30: 400 mg via INTRAVENOUS
  Filled 2022-11-30: qty 400

## 2022-11-30 NOTE — Patient Instructions (Signed)
[facility]  Discharge Instructions: Thank you for choosing [facility] to provide your oncology and hematology care.  If you have a lab appointment with the Cancer Center - please note that after April 8th, 2024, all labs will be drawn in the cancer center.  You do not have to check in or register with the main entrance as you have in the past but will complete your check-in in the cancer center.  Wear comfortable clothing and clothing appropriate for easy access to any Portacath or PICC line.   We strive to give you quality time with your provider. You may need to reschedule your appointment if you arrive late (15 or more minutes).  Arriving late affects you and other patients whose appointments are after yours.  Also, if you miss three or more appointments without notifying the office, you may be dismissed from the clinic at the provider's discretion.      For prescription refill requests, have your pharmacy contact our office and allow 72 hours for refills to be completed.    Today you received the following Venofer.  Iron Sucrose Injection What is this medication? IRON SUCROSE (EYE ern SOO krose) treats low levels of iron (iron deficiency anemia) in people with kidney disease. Iron is a mineral that plays an important role in making red blood cells, which carry oxygen from your lungs to the rest of your body. This medicine may be used for other purposes; ask your health care provider or pharmacist if you have questions. COMMON BRAND NAME(S): Venofer What should I tell my care team before I take this medication? They need to know if you have any of these conditions: Anemia not caused by low iron levels Heart disease High levels of iron in the blood Kidney disease Liver disease An unusual or allergic reaction to iron, other medications, foods, dyes, or preservatives Pregnant or trying to get pregnant Breastfeeding How should I use this  medication? This medication is for infusion into a vein. It is given in a hospital or clinic setting. Talk to your care team about the use of this medication in children. While this medication may be prescribed for children as young as 2 years for selected conditions, precautions do apply. Overdosage: If you think you have taken too much of this medicine contact a poison control center or emergency room at once. NOTE: This medicine is only for you. Do not share this medicine with others. What if I miss a dose? Keep appointments for follow-up doses. It is important not to miss your dose. Call your care team if you are unable to keep an appointment. What may interact with this medication? Do not take this medication with any of the following: Deferoxamine Dimercaprol Other iron products This medication may also interact with the following: Chloramphenicol Deferasirox This list may not describe all possible interactions. Give your health care provider a list of all the medicines, herbs, non-prescription drugs, or dietary supplements you use. Also tell them if you smoke, drink alcohol, or use illegal drugs. Some items may interact with your medicine. What should I watch for while using this medication? Visit your care team regularly. Tell your care team if your symptoms do not start to get better or if they get worse. You may need blood work done while you are taking this medication. You may need to follow a special diet. Talk to your care team. Foods that contain iron include: whole grains/cereals, dried fruits, beans, or  peas, leafy green vegetables, and organ meats (liver, kidney). What side effects may I notice from receiving this medication? Side effects that you should report to your care team as soon as possible: Allergic reactions--skin rash, itching, hives, swelling of the face, lips, tongue, or throat Low blood pressure--dizziness, feeling faint or lightheaded, blurry vision Shortness of  breath Side effects that usually do not require medical attention (report to your care team if they continue or are bothersome): Flushing Headache Joint pain Muscle pain Nausea Pain, redness, or irritation at injection site This list may not describe all possible side effects. Call your doctor for medical advice about side effects. You may report side effects to FDA at 1-800-FDA-1088. Where should I keep my medication? This medication is given in a hospital or clinic. It will not be stored at home. NOTE: This sheet is a summary. It may not cover all possible information. If you have questions about this medicine, talk to your doctor, pharmacist, or health care provider.  2024 Elsevier/Gold Standard (2022-08-03 00:00:00)    To help prevent nausea and vomiting after your treatment, we encourage you to take your nausea medication as directed.  BELOW ARE SYMPTOMS THAT SHOULD BE REPORTED IMMEDIATELY: *FEVER GREATER THAN 100.4 F (38 C) OR HIGHER *CHILLS OR SWEATING *NAUSEA AND VOMITING THAT IS NOT CONTROLLED WITH YOUR NAUSEA MEDICATION *UNUSUAL SHORTNESS OF BREATH *UNUSUAL BRUISING OR BLEEDING *URINARY PROBLEMS (pain or burning when urinating, or frequent urination) *BOWEL PROBLEMS (unusual diarrhea, constipation, pain near the anus) TENDERNESS IN MOUTH AND THROAT WITH OR WITHOUT PRESENCE OF ULCERS (sore throat, sores in mouth, or a toothache) UNUSUAL RASH, SWELLING OR PAIN  UNUSUAL VAGINAL DISCHARGE OR ITCHING   Items with * indicate a potential emergency and should be followed up as soon as possible or go to the Emergency Department if any problems should occur.  Please show the CHEMOTHERAPY ALERT CARD or IMMUNOTHERAPY ALERT CARD at check-in to the Emergency Department and triage nurse.  Should you have questions after your visit or need to cancel or reschedule your appointment, please contact [facility] [phone]  and follow the prompts.  Office hours are  8:00 a.m. to 4:30 p.m. Monday - Friday. Please note that voicemails left after 4:00 p.m. may not be returned until the following business day.  We are closed weekends and major holidays. You have access to a nurse at all times for urgent questions. Please call the main number to the clinic [phone] and follow the prompts.  For any non-urgent questions, you may also contact your provider using MyChart. We now offer e-Visits for anyone 2 and older to request care online for non-urgent symptoms. For details visit [email or web address].   Also download the MyChart app! Go to the app store, search "MyChart", open the app, select [facility], and log in with your MyChart username and password.

## 2022-11-30 NOTE — Progress Notes (Signed)
Patient presents today for iron infusion.  Patient is in satisfactory condition with no new complaints voiced.  BP elevated at arrival and recheck.  Dr. Anders Simmonds made aware.  All other vitals are stable.  IV placed in L arm.  IV flushed well with good blood return noted.  Tylenol and Zyrtec taken at 0905 prior to visit. We will proceed with infusion per provider orders.    Patient tolerated treatment well with no complaints voiced.  Patient left ambulatory with sister in stable condition.  Vital signs stable at discharge.  Follow up as scheduled.

## 2022-12-04 DIAGNOSIS — H6123 Impacted cerumen, bilateral: Secondary | ICD-10-CM | POA: Diagnosis not present

## 2022-12-04 DIAGNOSIS — H9201 Otalgia, right ear: Secondary | ICD-10-CM | POA: Diagnosis not present

## 2022-12-11 DIAGNOSIS — K279 Peptic ulcer, site unspecified, unspecified as acute or chronic, without hemorrhage or perforation: Secondary | ICD-10-CM

## 2022-12-11 HISTORY — DX: Peptic ulcer, site unspecified, unspecified as acute or chronic, without hemorrhage or perforation: K27.9

## 2022-12-21 NOTE — Patient Instructions (Signed)
Sara Fischer  12/21/2022     @PREFPERIOPPHARMACY @   Your procedure is scheduled on  12/26/2022.   Report to [facility] at  1030  A.M.   Call this number if you have problems the morning of surgery:  [phone]  If you experience any cold or flu symptoms such as cough, fever, chills, shortness of breath, etc. between now and your scheduled surgery, please notify us at the above number.   Remember:  Follow the diet and prep instructions given to you by the office.    You may drink clear liquids until 0830 am on 12/26/2022.    Clear liquids allowed are:                    Water, Black Coffee Only (No creamer, milk or cream, including half & half and powdered creamer), and Clear Sports drink (No red color; diabetics please choose diet or no sugar options)     Take these medicines the morning of surgery with A SIP OF WATER                                          metoprolol.    Do not wear jewelry, make-up or nail polish, including gel polish,  artificial nails, or any other type of covering on natural nails (fingers and  toes).  Do not wear lotions, powders, or perfumes, or deodorant.  Do not shave 48 hours prior to surgery.  Men may shave face and neck.  Do not bring valuables to the hospital.  [facility] is not responsible for any belongings or valuables.  Contacts, dentures or bridgework may not be worn into surgery.  Leave your suitcase in the car.  After surgery it may be brought to your room.  For patients admitted to the hospital, discharge time will be determined by your treatment team.  Patients discharged the day of surgery will not be allowed to drive home and must have someone with them for 24 hours.    Special instructions:   DO NOT smoke tobacco or vape for 24 hours before your procedure.  Please read over the following fact sheets that you were given. Anesthesia Post-op Instructions and Care and Recovery After Surgery      Upper  Endoscopy, Adult, Care After After the procedure, it is common to have a sore throat. It is also common to have: Mild stomach pain or discomfort. Bloating. Nausea. Follow these instructions at home: The instructions below may help you care for yourself at home. Your health care provider may give you more instructions. If you have questions, ask your health care provider. If you were given a sedative during the procedure, it can affect you for several hours. Do not drive or operate machinery until your health care provider says that it is safe. If you will be going home right after the procedure, plan to have a responsible adult: Take you home from the hospital or clinic. You will not be allowed to drive. Care for you for the time you are told. Follow instructions from your health care provider about what you may eat and drink. Return to your normal activities as told by your health care provider. Ask your health care provider what activities are safe for you. Take over-the-counter and prescription medicines only as told by your health care  provider. Contact a health care provider if you: Have a sore throat that lasts longer than one day. Have trouble swallowing. Have a fever. Get help right away if you: Vomit blood or your vomit looks like coffee grounds. Have bloody, black, or tarry stools. Have a very bad sore throat or you cannot swallow. Have difficulty breathing or very bad pain in your chest or abdomen. These symptoms may be an emergency. Get help right away. Call 911. Do not wait to see if the symptoms will go away. Do not drive yourself to the hospital. Summary After the procedure, it is common to have a sore throat, mild stomach discomfort, bloating, and nausea. If you were given a sedative during the procedure, it can affect you for several hours. Do not drive until your health care provider says that it is safe. Follow instructions from your health care provider about what you  may eat and drink. Return to your normal activities as told by your health care provider. This information is not intended to replace advice given to you by your health care provider. Make sure you discuss any questions you have with your health care provider. Document Revised: 06/07/2021 Document Reviewed: 06/07/2021 Elsevier Patient Education  2024 Elsevier Inc. Colonoscopy, Adult, Care After The following information offers guidance on how to care for yourself after your procedure. Your health care provider may also give you more specific instructions. If you have problems or questions, contact your health care provider. What can I expect after the procedure? After the procedure, it is common to have: A small amount of blood in your stool for 24 hours after the procedure. Some gas. Mild cramping or bloating of your abdomen. Follow these instructions at home: Eating and drinking  Drink enough fluid to keep your urine pale yellow. Follow instructions from your health care provider about eating or drinking restrictions. Resume your normal diet as told by your health care provider. Avoid heavy or fried foods that are hard to digest. Activity Rest as told by your health care provider. Avoid sitting for a long time without moving. Get up to take short walks every 1-2 hours. This is important to improve blood flow and breathing. Ask for help if you feel weak or unsteady. Return to your normal activities as told by your health care provider. Ask your health care provider what activities are safe for you. Managing cramping and bloating  Try walking around when you have cramps or feel bloated. If directed, apply heat to your abdomen as told by your health care provider. Use the heat source that your health care provider recommends, such as a moist heat pack or a heating pad. Place a towel between your skin and the heat source. Leave the heat on for 20-30 minutes. Remove the heat if your skin  turns bright red. This is especially important if you are unable to feel pain, heat, or cold. You have a greater risk of getting burned. General instructions If you were given a sedative during the procedure, it can affect you for several hours. Do not drive or operate machinery until your health care provider says that it is safe. For the first 24 hours after the procedure: Do not sign important documents. Do not drink alcohol. Do your regular daily activities at a slower pace than normal. Eat soft foods that are easy to digest. Take over-the-counter and prescription medicines only as told by your health care provider. Keep all follow-up visits. This is important. Contact a health  care provider if: You have blood in your stool 2-3 days after the procedure. Get help right away if: You have more than a small spotting of blood in your stool. You have large blood clots in your stool. You have swelling of your abdomen. You have nausea or vomiting. You have a fever. You have increasing pain in your abdomen that is not relieved with medicine. These symptoms may be an emergency. Get help right away. Call 911. Do not wait to see if the symptoms will go away. Do not drive yourself to the hospital. Summary After the procedure, it is common to have a small amount of blood in your stool. You may also have mild cramping and bloating of your abdomen. If you were given a sedative during the procedure, it can affect you for several hours. Do not drive or operate machinery until your health care provider says that it is safe. Get help right away if you have a lot of blood in your stool, nausea or vomiting, a fever, or increased pain in your abdomen. This information is not intended to replace advice given to you by your health care provider. Make sure you discuss any questions you have with your health care provider. Document Revised: 04/10/2022 Document Reviewed: 10/19/2020 Elsevier Patient Education   2024 Elsevier Inc. Monitored Anesthesia Care, Care After The following information offers guidance on how to care for yourself after your procedure. Your health care provider may also give you more specific instructions. If you have problems or questions, contact your health care provider. What can I expect after the procedure? After the procedure, it is common to have: Tiredness. Little or no memory about what happened during or after the procedure. Impaired judgment when it comes to making decisions. Nausea or vomiting. Some trouble with balance. Follow these instructions at home: For the time period you were told by your health care provider:  Rest. Do not participate in activities where you could fall or become injured. Do not drive or use machinery. Do not drink alcohol. Do not take sleeping pills or medicines that cause drowsiness. Do not make important decisions or sign legal documents. Do not take care of children on your own. Medicines Take over-the-counter and prescription medicines only as told by your health care provider. If you were prescribed antibiotics, take them as told by your health care provider. Do not stop using the antibiotic even if you start to feel better. Eating and drinking Follow instructions from your health care provider about what you may eat and drink. Drink enough fluid to keep your urine pale yellow. If you vomit: Drink clear fluids slowly and in small amounts as you are able. Clear fluids include water, ice chips, low-calorie sports drinks, and fruit juice that has water added to it (diluted fruit juice). Eat light and bland foods in small amounts as you are able. These foods include bananas, applesauce, rice, lean meats, toast, and crackers. General instructions  Have a responsible adult stay with you for the time you are told. It is important to have someone help care for you until you are awake and alert. If you have sleep apnea, surgery and  some medicines can increase your risk for breathing problems. Follow instructions from your health care provider about wearing your sleep device: When you are sleeping. This includes during daytime naps. While taking prescription pain medicines, sleeping medicines, or medicines that make you drowsy. Do not use any products that contain nicotine or tobacco. These products include  cigarettes, chewing tobacco, and vaping devices, such as e-cigarettes. If you need help quitting, ask your health care provider. Contact a health care provider if: You feel nauseous or vomit every time you eat or drink. You feel light-headed. You are still sleepy or having trouble with balance after 24 hours. You get a rash. You have a fever. You have redness or swelling around the IV site. Get help right away if: You have trouble breathing. You have new confusion after you get home. These symptoms may be an emergency. Get help right away. Call 911. Do not wait to see if the symptoms will go away. Do not drive yourself to the hospital. This information is not intended to replace advice given to you by your health care provider. Make sure you discuss any questions you have with your health care provider. Document Revised: 07/24/2021 Document Reviewed: 07/24/2021 Elsevier Patient Education  2024 [employer].

## 2022-12-24 ENCOUNTER — Encounter (HOSPITAL_COMMUNITY)
Admission: RE | Admit: 2022-12-24 | Discharge: 2022-12-24 | Disposition: A | Discharge status: Home / Self Care | Payer: Medicare HMO | Source: Ambulatory Visit | Attending: Internal Medicine

## 2022-12-24 ENCOUNTER — Encounter (HOSPITAL_COMMUNITY): Payer: Self-pay

## 2022-12-24 VITALS — BP 149/67 | HR 93 | Temp 98.6°F | Resp 18 | Ht 61.0 in | Wt 147.0 lb

## 2022-12-24 DIAGNOSIS — I10 Essential (primary) hypertension: Secondary | ICD-10-CM | POA: Insufficient documentation

## 2022-12-24 DIAGNOSIS — Z01818 Encounter for other preprocedural examination: Secondary | ICD-10-CM | POA: Diagnosis not present

## 2022-12-24 DIAGNOSIS — Z79899 Other long term (current) drug therapy: Secondary | ICD-10-CM | POA: Insufficient documentation

## 2022-12-24 DIAGNOSIS — D649 Anemia, unspecified: Secondary | ICD-10-CM | POA: Diagnosis not present

## 2022-12-24 LAB — CBC WITH DIFFERENTIAL/PLATELET
Abs Immature Granulocytes: 0.02 10*3/uL (ref 0.00–0.07)
Basophils Absolute: 0 10*3/uL (ref 0.0–0.1)
Basophils Relative: 0 %
Eosinophils Absolute: 0.2 10*3/uL (ref 0.0–0.5)
Eosinophils Relative: 3 %
HCT: 30.4 % — ABNORMAL LOW (ref 36.0–46.0)
Hemoglobin: 9.6 g/dL — ABNORMAL LOW (ref 12.0–15.0)
Immature Granulocytes: 0 %
Lymphocytes Relative: 17 %
Lymphs Abs: 1.1 10*3/uL (ref 0.7–4.0)
MCH: 30.3 pg (ref 26.0–34.0)
MCHC: 31.6 g/dL (ref 30.0–36.0)
MCV: 95.9 fL (ref 80.0–100.0)
Monocytes Absolute: 0.8 10*3/uL (ref 0.1–1.0)
Monocytes Relative: 13 %
Neutro Abs: 4.2 10*3/uL (ref 1.7–7.7)
Neutrophils Relative %: 67 %
Platelets: 206 10*3/uL (ref 150–400)
RBC: 3.17 MIL/uL — ABNORMAL LOW (ref 3.87–5.11)
RDW: 13.7 % (ref 11.5–15.5)
WBC: 6.3 10*3/uL (ref 4.0–10.5)
nRBC: 0 % (ref 0.0–0.2)

## 2022-12-24 LAB — BASIC METABOLIC PANEL
Anion gap: 8 (ref 5–15)
BUN: 17 mg/dL (ref 8–23)
CO2: 27 mmol/L (ref 22–32)
Calcium: 9.2 mg/dL (ref 8.9–10.3)
Chloride: 104 mmol/L (ref 98–111)
Creatinine, Ser: 0.84 mg/dL (ref 0.44–1.00)
GFR, Estimated: >60 mL/min (ref >60)
Glucose, Bld: 98 mg/dL (ref 70–99)
Potassium: 3.3 mmol/L — ABNORMAL LOW (ref 3.5–5.1)
Sodium: 139 mmol/L (ref 135–145)

## 2022-12-26 ENCOUNTER — Ambulatory Visit (HOSPITAL_COMMUNITY): Payer: Medicare HMO | Admitting: Anesthesiology

## 2022-12-26 ENCOUNTER — Encounter (HOSPITAL_COMMUNITY)
Admission: RE | Disposition: A | Discharge status: Home / Self Care | Payer: Self-pay | Source: Home / Self Care | Attending: Internal Medicine

## 2022-12-26 ENCOUNTER — Encounter (HOSPITAL_COMMUNITY): Payer: Self-pay | Admitting: Internal Medicine

## 2022-12-26 ENCOUNTER — Ambulatory Visit (HOSPITAL_COMMUNITY)
Admission: RE | Admit: 2022-12-26 | Discharge: 2022-12-26 | Disposition: A | Discharge status: Home / Self Care | Payer: Medicare HMO | Attending: Internal Medicine | Admitting: Internal Medicine

## 2022-12-26 ENCOUNTER — Telehealth: Payer: Self-pay

## 2022-12-26 DIAGNOSIS — K259 Gastric ulcer, unspecified as acute or chronic, without hemorrhage or perforation: Secondary | ICD-10-CM | POA: Insufficient documentation

## 2022-12-26 DIAGNOSIS — Z79899 Other long term (current) drug therapy: Secondary | ICD-10-CM | POA: Diagnosis not present

## 2022-12-26 DIAGNOSIS — K3189 Other diseases of stomach and duodenum: Secondary | ICD-10-CM | POA: Diagnosis not present

## 2022-12-26 DIAGNOSIS — K5730 Diverticulosis of large intestine without perforation or abscess without bleeding: Secondary | ICD-10-CM | POA: Diagnosis not present

## 2022-12-26 DIAGNOSIS — I10 Essential (primary) hypertension: Secondary | ICD-10-CM | POA: Insufficient documentation

## 2022-12-26 DIAGNOSIS — D509 Iron deficiency anemia, unspecified: Secondary | ICD-10-CM | POA: Diagnosis not present

## 2022-12-26 DIAGNOSIS — K449 Diaphragmatic hernia without obstruction or gangrene: Secondary | ICD-10-CM

## 2022-12-26 DIAGNOSIS — Z1211 Encounter for screening for malignant neoplasm of colon: Secondary | ICD-10-CM

## 2022-12-26 DIAGNOSIS — Z87891 Personal history of nicotine dependence: Secondary | ICD-10-CM | POA: Insufficient documentation

## 2022-12-26 HISTORY — PX: BIOPSY: SHX5522

## 2022-12-26 HISTORY — PX: COLONOSCOPY WITH PROPOFOL: SHX5780

## 2022-12-26 HISTORY — PX: ESOPHAGOGASTRODUODENOSCOPY (EGD) WITH PROPOFOL: SHX5813

## 2022-12-26 SURGERY — COLONOSCOPY WITH PROPOFOL
Anesthesia: General

## 2022-12-26 MED ORDER — LACTATED RINGERS IV SOLN: INTRAVENOUS | Status: DC | PRN

## 2022-12-26 MED ORDER — LIDOCAINE HCL (CARDIAC) PF 100 MG/5ML IV SOSY
PREFILLED_SYRINGE | INTRAVENOUS | Status: DC | PRN
Start: 2022-12-26 — End: 2022-12-26
  Administered 2022-12-26: 60 mg via INTRATRACHEAL

## 2022-12-26 MED ORDER — EPHEDRINE 5 MG/ML INJ
INTRAVENOUS | Status: AC
  Filled 2022-12-26: qty 10

## 2022-12-26 MED ORDER — PHENYLEPHRINE HCL (PRESSORS) 10 MG/ML IV SOLN
INTRAVENOUS | Status: DC | PRN
Start: 2022-12-26 — End: 2022-12-26
  Administered 2022-12-26: 160 ug via INTRAVENOUS

## 2022-12-26 MED ORDER — PHENYLEPHRINE 80 MCG/ML (10ML) SYRINGE FOR IV PUSH (FOR BLOOD PRESSURE SUPPORT)
PREFILLED_SYRINGE | INTRAVENOUS | Status: AC
  Filled 2022-12-26: qty 30

## 2022-12-26 MED ORDER — PROPOFOL 10 MG/ML IV BOLUS
INTRAVENOUS | Status: DC | PRN
  Administered 2022-12-26: 60 mg via INTRAVENOUS

## 2022-12-26 MED ORDER — PANTOPRAZOLE SODIUM 40 MG PO TBEC: 40.0000 mg | DELAYED_RELEASE_TABLET | Freq: Two times a day (BID) | ORAL | 5 refills | Status: DC

## 2022-12-26 MED ORDER — PROPOFOL 500 MG/50ML IV EMUL
INTRAVENOUS | Status: DC | PRN
  Administered 2022-12-26: 150 ug/kg/min via INTRAVENOUS

## 2022-12-26 NOTE — H&P (Signed)
@LOGO @   Primary Care Physician:  Elfredia Nevins, MD Primary Gastroenterologist:  Dr. Jena Gauss  Pre-Procedure History & Physical: HPI:  Sara Fischer is a 82 y.o. female here for For diagnostic evaluation for iron deficiency anemia and occult blood positive stool.  Really not much in the way of any focal GI symptoms.   constipation management laxatives.  Here  for a diagnostic EGD and colonoscopy.  Denies dysphagia.  Past Medical History:  Diagnosis Date   High cholesterol    Hypertension    IDA (iron deficiency anemia)     Past Surgical History:  Procedure Laterality Date   ABDOMINAL HYSTERECTOMY     APPENDECTOMY     COLONOSCOPY  2010   Dr. Jena Gauss: pancolonic diverticulosis, sigmoid colon tubular adenoma removed.    COLONOSCOPY N/A 02/29/2016   Procedure: COLONOSCOPY;  Surgeon: Corbin Ade, MD;  Location: [facility];  Service: Endoscopy;  Laterality: N/A;  230   INCISIONAL HERNIA REPAIR N/A 12/28/2016   Procedure: HERNIA REPAIR INCISIONAL;  Surgeon: Franky Macho, MD;  Location: [facility]S;  Service: General;  Laterality: N/A;   KNEE ARTHROSCOPY     bilateral   LAPAROSCOPIC BILATERAL SALPINGO OOPHERECTOMY  2001   PARTIAL HYSTERECTOMY  1979    Prior to Admission medications   Medication Sig Start Date End Date Taking? Authorizing Provider  aspirin EC 81 MG tablet Take 81 mg by mouth daily.   Yes [provider]  Cholecalciferol (D3-1000 PO) Take 1 capsule by mouth daily.   Yes [provider]  Cyanocobalamin (VITAMIN B 12) 250 MCG LOZG Take by mouth.   Yes [provider]  fluticasone (FLONASE) 50 MCG/ACT nasal spray Place 1 spray into both nostrils daily as needed for allergies or rhinitis.   Yes [provider]  furosemide (LASIX) 40 MG tablet  12/08/19  Yes [provider]  ketoconazole (NIZORAL) 2 % cream Apply 1 application topically 2 (two) times daily as needed.  06/04/19  Yes [provider]  Menaquinone-7 (VITAMIN  K2 PO) Take 1 tablet by mouth daily.   Yes [provider]  metoprolol succinate (TOPROL-XL) 100 MG 24 hr tablet Take 100 mg by mouth daily. 09/10/17  Yes [provider]  Propylene Glycol 0.6 % SOLN Place 1 drop into both eyes 2 (two) times daily as needed (dry eyes).   Yes [provider]  simvastatin (ZOCOR) 20 MG tablet Take 20 mg by mouth at bedtime.    Yes [provider]    Allergies as of 11/27/2022 - Review Complete 11/22/2022  Allergen Reaction Noted   Feldene [piroxicam]  11/02/2013   Lodine [etodolac]  11/08/2010    Family History  Problem Relation Age of Onset   Diabetes Mother    Heart disease Mother    Heart disease Father 83   Dementia Sister    Heart disease Brother    Diabetes Sister    Heart disease Sister    Heart disease Sister    Diabetes Sister    Arthritis Sister    Heart disease Sister    Heart disease Brother    Healthy Son    Healthy Son    Colon cancer Neg Hx     Social History   Socioeconomic History   Marital status: Widowed    Spouse name: Not on file   Number of children: 4   Years of education: Not on file   Highest education level: Not on file  Occupational History  Occupation: Retired  Tobacco Use   Smoking status: Former    Current packs/day: 0.00    Average packs/day: 0.5 packs/day for 5.2 years (2.6 ttl pk-yrs)    Types: Cigarettes    Start date: 08/28/1963    Quit date: 11/02/1968    Years since quitting: 54.1   Smokeless tobacco: Never  Vaping Use   Vaping status: Never Used  Substance and Sexual Activity   Alcohol use: Yes    Alcohol/week: 0.0 standard drinks of alcohol    Comment: wine occas.   Drug use: No   Sexual activity: Not on file  Other Topics Concern   Not on file  Social History Narrative   Right handed    Caffeine use: 1 cup coffee every morning   1 soda per day   Social Determinants of Health   Financial Resource Strain: Not on file  Food Insecurity: Not on file   Transportation Needs: Not on file  Physical Activity: Not on file  Stress: Not on file  Social Connections: Not on file  Intimate Partner Violence: Not on file    Review of Systems: See HPI, otherwise negative ROS  Physical Exam: BP (!) 157/74   Pulse 72   Temp 98.3 F (36.8 C) (Oral)   Resp 16   SpO2 98%  General:   Alert,  Well-developed, well-nourished, pleasant and cooperative in NAD Neck:  Supple; no masses or thyromegaly. No significant cervical adenopathy. Lungs:  Clear throughout to auscultation.   No wheezes, crackles, or rhonchi. No acute distress. Heart:  Regular rate and rhythm; no murmurs, clicks, rubs,  or gallops. Abdomen: Non-distended, normal bowel sounds.  Soft and nontender without appreciable mass or hepatosplenomegaly.   Impression/Plan:    82 year old lady with iron deficiency anemia occult blood positive stool.  She is here for EGD and colonoscopy per plan.  The risks, benefits, limitations, imponderables and alternatives regarding both EGD and colonoscopy have been reviewed with the patient. Questions have been answered. All parties agreeable.       Notice: This dictation was prepared with Dragon dictation along with smaller phrase technology. Any transcriptional errors that result from this process are unintentional and may not be corrected upon review.

## 2022-12-26 NOTE — Anesthesia Preprocedure Evaluation (Signed)
Anesthesia Evaluation  Patient identified by MRN, date of birth, ID band Patient awake    Reviewed: Allergy & Precautions, H&P , NPO status , Patient's Chart, lab work & pertinent test results, reviewed documented beta blocker date and time   Airway Mallampati: II  TM Distance: >3 FB Neck ROM: full    Dental no notable dental hx.    Pulmonary neg pulmonary ROS, former smoker   Pulmonary exam normal breath sounds clear to auscultation       Cardiovascular Exercise Tolerance: Good hypertension, negative cardio ROS  Rhythm:regular Rate:Normal     Neuro/Psych negative neurological ROS  negative psych ROS   GI/Hepatic negative GI ROS, Neg liver ROS,,,  Endo/Other  negative endocrine ROS    Renal/GU negative Renal ROS  negative genitourinary   Musculoskeletal   Abdominal   Peds  Hematology negative hematology ROS (+) Blood dyscrasia, anemia   Anesthesia Other Findings   Reproductive/Obstetrics negative OB ROS                             Anesthesia Physical Anesthesia Plan  ASA: 2  Anesthesia Plan: General   Post-op Pain Management:    Induction:   PONV Risk Score and Plan: Propofol infusion  Airway Management Planned:   Additional Equipment:   Intra-op Plan:   Post-operative Plan:   Informed Consent: I have reviewed the patients History and Physical, chart, labs and discussed the procedure including the risks, benefits and alternatives for the proposed anesthesia with the patient or authorized representative who has indicated his/her understanding and acceptance.     Dental Advisory Given  Plan Discussed with: CRNA  Anesthesia Plan Comments:        Anesthesia Quick Evaluation

## 2022-12-26 NOTE — Op Note (Signed)
[facility] Patient Name: Sara Fischer Procedure Date: 12/26/2022 11:35 AM MRN: [id number] Date of Birth: Oct 26, 1940 Attending MD: Gennette Pac , MD, [id number] CSN: [id number] Age: 82 Admit Type: Outpatient Procedure:                Upper GI endoscopy Indications:              Iron deficiency anemia Providers:                Gennette Pac, MD, Crystal Page, Lennice Sites                            Technician, Technician Referring MD:              Medicines:                Propofol per Anesthesia Complications:            No immediate complications. Estimated Blood Loss:     Estimated blood loss was minimal. Procedure:                Pre-Anesthesia Assessment:                           - Prior to the procedure, a History and Physical                            was performed, and patient medications and                            allergies were reviewed. The patient's tolerance of                            previous anesthesia was also reviewed. The risks                            and benefits of the procedure and the sedation                            options and risks were discussed with the patient.                            All questions were answered, and informed consent                            was obtained. Prior Anticoagulants: The patient has                            taken no anticoagulant or antiplatelet agents. ASA                            Grade Assessment: III - A patient with severe                            systemic disease. After reviewing the risks and  benefits, the patient was deemed in satisfactory                            condition to undergo the procedure.                           After obtaining informed consent, the endoscope was                            passed under direct vision. Throughout the                            procedure, the patient's blood pressure, pulse, and                             oxygen saturations were monitored continuously. The                            [id number]-[id number] ([id number]) scope was introduced through the                            mouth, and advanced to the second part of duodenum.                            The upper GI endoscopy was accomplished without                            difficulty. The patient tolerated the procedure                            well. Scope In: 12:33:20 PM Scope Out: 12:38:06 PM Total Procedure Duration: 0 hours 4 minutes 46 seconds  Findings:      The examined esophagus was normal. Stomach empty.      A large hiatal hernia was present. 1 cm clean-based antral prepyloric       ulcer present with satellite erosions. Please see photos. Patent pylorus.      The duodenal bulb and second portion of the duodenum were normal.       Biopsies of the gastric ulcer taken for histologic study. Impression:               - Normal esophagus.                           - Large hiatal hernia.                           -Antral ulcer with satellite erosions. Status post                            biopsy Moderate Sedation:      Moderate (conscious) sedation was personally administered by an       anesthesia professional. The following parameters were monitored: oxygen       saturation, heart rate, blood pressure, respiratory rate, EKG, adequacy       of pulmonary ventilation, and response to care. Recommendation:           -  Patient has a contact number available for                            emergencies. The signs and symptoms of potential                            delayed complications were discussed with the                            patient. Return to normal activities tomorrow.                            Written discharge instructions were provided to the                            patient.                           - Advance diet as tolerated.                           - Continue present medications. Avoid all NSAIDs                             aside from low-dose aspirin. Begin Protonix 40 mg                            twice daily 30 minutes for breakfast and supper                           - Return to my office in 3 months. Procedure Code(s):        --- Professional ---                           [phone], Esophagogastroduodenoscopy, flexible,                            transoral; diagnostic, including collection of                            specimen(s) by brushing or washing, when performed                            (separate procedure) Diagnosis Code(s):        --- Professional ---                           K44.9, Diaphragmatic hernia without obstruction or                            gangrene                           D50.9, Iron deficiency anemia, unspecified CPT copyright 2022 American [employer] Association. All rights reserved. The codes documented in this report are preliminary and upon coder review may  be revised to meet current compliance requirements. Gerrit Friends. Bethanee Redondo, MD Gennette Pac, MD 12/26/2022 1:11:42 PM This report has been signed electronically. Number of Addenda: 0

## 2022-12-26 NOTE — Transfer of Care (Signed)
Immediate Anesthesia Transfer of Care Note  Patient: BLINDA TUREK  Procedure(s) Performed: COLONOSCOPY WITH PROPOFOL ESOPHAGOGASTRODUODENOSCOPY (EGD) WITH PROPOFOL BIOPSY  Patient Location: Endoscopy Unit  Anesthesia Type:General  Level of Consciousness: drowsy  Airway & Oxygen Therapy: Patient Spontanous Breathing  Post-op Assessment: Report given to RN and Post -op Vital signs reviewed and stable  Post vital signs: Reviewed and stable  Last Vitals:  Vitals Value Taken Time  BP 110/50   Temp 98   Pulse 69   Resp 16   SpO2 96     Last Pain:  Vitals:   12/26/22 1228  TempSrc:   PainSc: 0-No pain      Patients Stated Pain Goal: 5 (12/26/22 1111)  Complications: No notable events documented.

## 2022-12-26 NOTE — Discharge Instructions (Addendum)
EGD Discharge instructions Please read the instructions outlined below and refer to this sheet in the next few weeks. These discharge instructions provide you with general information on caring for yourself after you leave the hospital. Your doctor may also give you specific instructions. While your treatment has been planned according to the most current medical practices available, unavoidable complications occasionally occur. If you have any problems or questions after discharge, please call your doctor. ACTIVITY You may resume your regular activity but move at a slower pace for the next 24 hours.  Take frequent rest periods for the next 24 hours.  Walking will help expel (get rid of) the air and reduce the bloated feeling in your abdomen.  No driving for 24 hours (because of the anesthesia (medicine) used during the test).  You may shower.  Do not sign any important legal documents or operate any machinery for 24 hours (because of the anesthesia used during the test).  NUTRITION Drink plenty of fluids.  You may resume your normal diet.  Begin with a light meal and progress to your normal diet.  Avoid alcoholic beverages for 24 hours or as instructed by your caregiver.  MEDICATIONS You may resume your normal medications unless your caregiver tells you otherwise.  WHAT YOU CAN EXPECT TODAY You may experience abdominal discomfort such as a feeling of fullness or "gas" pains.  FOLLOW-UP Your doctor will discuss the results of your test with you.  SEEK IMMEDIATE MEDICAL ATTENTION IF ANY OF THE FOLLOWING OCCUR: Excessive nausea (feeling sick to your stomach) and/or vomiting.  Severe abdominal pain and distention (swelling).  Trouble swallowing.  Temperature over 101 F (37.8 C).  Rectal bleeding or vomiting of blood.     Colonoscopy Discharge Instructions  Read the instructions outlined below and refer to this sheet in the next few weeks. These discharge instructions provide you with  general information on caring for yourself after you leave the hospital. Your doctor may also give you specific instructions. While your treatment has been planned according to the most current medical practices available, unavoidable complications occasionally occur. If you have any problems or questions after discharge, call Dr. Jena Gauss at [phone]. ACTIVITY You may resume your regular activity, but move at a slower pace for the next 24 hours.  Take frequent rest periods for the next 24 hours.  Walking will help get rid of the air and reduce the bloated feeling in your belly (abdomen).  No driving for 24 hours (because of the medicine (anesthesia) used during the test).   Do not sign any important legal documents or operate any machinery for 24 hours (because of the anesthesia used during the test).  NUTRITION Drink plenty of fluids.  You may resume your normal diet as instructed by your doctor.  Begin with a light meal and progress to your normal diet. Heavy or fried foods are harder to digest and may make you feel sick to your stomach (nauseated).  Avoid alcoholic beverages for 24 hours or as instructed.  MEDICATIONS You may resume your normal medications unless your doctor tells you otherwise.  WHAT YOU CAN EXPECT TODAY Some feelings of bloating in the abdomen.  Passage of more gas than usual.  Spotting of blood in your stool or on the toilet paper.  IF YOU HAD POLYPS REMOVED DURING THE COLONOSCOPY: No aspirin products for 7 days or as instructed.  No alcohol for 7 days or as instructed.  Eat a soft diet for the next 24 hours.  FINDING OUT THE RESULTS OF YOUR TEST Not all test results are available during your visit. If your test results are not back during the visit, make an appointment with your caregiver to find out the results. Do not assume everything is normal if you have not heard from your caregiver or the medical facility. It is important for you to follow up on all of your test  results.  SEEK IMMEDIATE MEDICAL ATTENTION IF: You have more than a spotting of blood in your stool.  Your belly is swollen (abdominal distention).  You are nauseated or vomiting.  You have a temperature over 101.  You have abdominal pain or discomfort that is severe or gets worse throughout the day.      You have a stomach ulcer.  Biopsies taken.   further recommendations to follow pending review of pathology report  Except for a daily baby aspirin avoid all NSAIDs like Goody powders BC's Advil Aleve and Motrin   begin Protonix 40 mg pill 1 twice daily 30 minutes for breakfast and supper.  New prescription  to cure your ulcer   diverticulosis only found in your colon.    A future colonoscopy is not recommended unless new symptoms develop   at patient request, I called Sara Fischer at [phone] -  rolled to voicemail.  Left a message.    Office visit with Ermalinda Memos in 3 months

## 2022-12-26 NOTE — Op Note (Signed)
[facility] Patient Name: Sara Fischer Procedure Date: 12/26/2022 11:34 AM MRN: [id number] Date of Birth: Jan 23, 1941 Attending MD: Gennette Pac , MD, [id number] CSN: [id number] Age: 82 Admit Type: Outpatient Procedure:                Colonoscopy Indications:              Iron deficiency anemia Providers:                Gennette Pac, MD, Crystal Page, Lennice Sites                            Technician, Technician Referring MD:              Medicines:                Propofol per Anesthesia Complications:            No immediate complications. Estimated Blood Loss:     Estimated blood loss: none. Procedure:                Pre-Anesthesia Assessment:                           - Prior to the procedure, a History and Physical                            was performed, and patient medications and                            allergies were reviewed. The patient's tolerance of                            previous anesthesia was also reviewed. The risks                            and benefits of the procedure and the sedation                            options and risks were discussed with the patient.                            All questions were answered, and informed consent                            was obtained. Prior Anticoagulants: The patient has                            taken no anticoagulant or antiplatelet agents. ASA                            Grade Assessment: III - A patient with severe                            systemic disease. After reviewing the risks and  benefits, the patient was deemed in satisfactory                            condition to undergo the procedure.                           After obtaining informed consent, the colonoscope                            was passed under direct vision. Throughout the                            procedure, the patient's blood pressure, pulse, and                            oxygen  saturations were monitored continuously. The                            [id number]) scope was introduced through the                            anus and advanced to the 5 cm into the ileum. The                            colonoscopy was performed without difficulty. The                            patient tolerated the procedure well. The quality                            of the bowel preparation was adequate. The terminal                            ileum, ileocecal valve, appendiceal orifice, and                            rectum were photographed. The colonoscopy was                            performed without difficulty. The patient tolerated                            the procedure well. The quality of the bowel                            preparation was adequate. The terminal ileum,                            ileocecal valve, appendiceal orifice, and rectum                            were photographed. Scope In: 12:42:58 PM Scope Out: 12:56:54 PM Scope Withdrawal Time: 0 hours 6 minutes 1 second  Total Procedure Duration: 0 hours 13 minutes  56 seconds  Findings:      The perianal and digital rectal examinations were normal.      Scattered medium-mouthed diverticula were found in the entire colon.      The exam was otherwise without abnormality on direct and retroflexion       views. Distal 5 cm of terminal ileum appeared normal. Impression:               - Diverticulosis in the entire examined colon.                           - The examination was otherwise normal on direct                            and retroflexion views. Normal-appearing terminal                            ileum.                           - No specimens collected. Moderate Sedation:      Moderate (conscious) sedation was personally administered by an       anesthesia professional. The following parameters were monitored: oxygen       saturation, heart rate, blood pressure, respiratory rate, EKG, adequacy        of pulmonary ventilation, and response to care. Recommendation:           - Patient has a contact number available for                            emergencies. The signs and symptoms of potential                            delayed complications were discussed with the                            patient. Return to normal activities tomorrow.                            Written discharge instructions were provided to the                            patient.                           - Advance diet as tolerated.                           - Continue present medications.                           - No repeat colonoscopy due to age. See EGD report.                            - Return to GI clinic in 3 months. Procedure Code(s):        --- Professional ---  16109, Colonoscopy, flexible; diagnostic, including                            collection of specimen(s) by brushing or washing,                            when performed (separate procedure) Diagnosis Code(s):        --- Professional ---                           D50.9, Iron deficiency anemia, unspecified                           K57.30, Diverticulosis of large intestine without                            perforation or abscess without bleeding CPT copyright 2022 American Medical Association. All rights reserved. The codes documented in this report are preliminary and upon coder review may  be revised to meet current compliance requirements. Gerrit Friends. Emelina Hinch, MD Gennette Pac, MD 12/26/2022 1:57:36 PM This report has been signed electronically. Number of Addenda: 0

## 2022-12-26 NOTE — Telephone Encounter (Signed)
-----   Message from Eula Listen sent at 12/26/2022  1:16 PM EDT -----  not sure if  previous note went through.  On this patient.  New prescription Protonix 40 mg twice daily dispense 60 pills with 5 refills.  Take 130 minutes for breakfast and supper.  She should be getting an office visit in 3 months.  Thanks.

## 2022-12-26 NOTE — Telephone Encounter (Signed)
Rx sent to pharmacy on file, routing to [place] to schedule f/u

## 2022-12-27 ENCOUNTER — Encounter: Payer: Self-pay | Admitting: Internal Medicine

## 2022-12-27 LAB — SURGICAL PATHOLOGY

## 2022-12-28 DIAGNOSIS — D518 Other vitamin B12 deficiency anemias: Secondary | ICD-10-CM | POA: Diagnosis not present

## 2022-12-30 NOTE — Anesthesia Postprocedure Evaluation (Signed)
Anesthesia Post Note  Patient: DEVANN TROXLER  Procedure(s) Performed: COLONOSCOPY WITH PROPOFOL ESOPHAGOGASTRODUODENOSCOPY (EGD) WITH PROPOFOL BIOPSY  Patient location during evaluation: Phase II Anesthesia Type: General Level of consciousness: awake Pain management: pain level controlled Vital Signs Assessment: post-procedure vital signs reviewed and stable Respiratory status: spontaneous breathing and respiratory function stable Cardiovascular status: blood pressure returned to baseline and stable Postop Assessment: no headache and no apparent nausea or vomiting Anesthetic complications: no Comments: Late entry   No notable events documented.   Last Vitals:  Vitals:   12/26/22 1111 12/26/22 1303  BP: (!) 157/74 (!) 118/40  Pulse: 72 68  Resp: 16 15  Temp: 36.8 C 36.6 C  SpO2: 98% 96%    Last Pain:  Vitals:   12/26/22 1303  TempSrc: Axillary  PainSc: 0-No pain                 [name]

## 2023-01-02 ENCOUNTER — Encounter (HOSPITAL_COMMUNITY): Payer: Self-pay | Admitting: Internal Medicine

## 2023-01-04 ENCOUNTER — Encounter: Payer: Self-pay | Admitting: Gastroenterology

## 2023-01-04 NOTE — Telephone Encounter (Signed)
Called patient but was told "she didn't live at the number" we called.  Mailed her a letter asking her to get in touch with the office so that we could schedule her for an OV.

## 2023-01-11 ENCOUNTER — Encounter (HOSPITAL_COMMUNITY): Payer: Self-pay | Admitting: *Deleted

## 2023-01-11 ENCOUNTER — Emergency Department (HOSPITAL_COMMUNITY): Payer: Medicare HMO

## 2023-01-11 ENCOUNTER — Other Ambulatory Visit: Payer: Self-pay

## 2023-01-11 ENCOUNTER — Emergency Department (HOSPITAL_COMMUNITY)
Admission: EM | Admit: 2023-01-11 | Discharge: 2023-01-11 | Disposition: A | Discharge status: Home / Self Care | Payer: Medicare HMO | Attending: Emergency Medicine | Admitting: Emergency Medicine

## 2023-01-11 DIAGNOSIS — R8281 Pyuria: Secondary | ICD-10-CM | POA: Diagnosis not present

## 2023-01-11 DIAGNOSIS — K429 Umbilical hernia without obstruction or gangrene: Secondary | ICD-10-CM | POA: Diagnosis not present

## 2023-01-11 DIAGNOSIS — Z79899 Other long term (current) drug therapy: Secondary | ICD-10-CM | POA: Insufficient documentation

## 2023-01-11 DIAGNOSIS — D649 Anemia, unspecified: Secondary | ICD-10-CM | POA: Diagnosis not present

## 2023-01-11 DIAGNOSIS — R1031 Right lower quadrant pain: Secondary | ICD-10-CM | POA: Diagnosis present

## 2023-01-11 DIAGNOSIS — I10 Essential (primary) hypertension: Secondary | ICD-10-CM | POA: Diagnosis not present

## 2023-01-11 DIAGNOSIS — Z7982 Long term (current) use of aspirin: Secondary | ICD-10-CM | POA: Insufficient documentation

## 2023-01-11 DIAGNOSIS — D72829 Elevated white blood cell count, unspecified: Secondary | ICD-10-CM | POA: Insufficient documentation

## 2023-01-11 DIAGNOSIS — N23 Unspecified renal colic: Secondary | ICD-10-CM

## 2023-01-11 DIAGNOSIS — E876 Hypokalemia: Secondary | ICD-10-CM | POA: Diagnosis not present

## 2023-01-11 DIAGNOSIS — N132 Hydronephrosis with renal and ureteral calculous obstruction: Secondary | ICD-10-CM | POA: Diagnosis not present

## 2023-01-11 DIAGNOSIS — K5730 Diverticulosis of large intestine without perforation or abscess without bleeding: Secondary | ICD-10-CM | POA: Diagnosis not present

## 2023-01-11 DIAGNOSIS — N201 Calculus of ureter: Secondary | ICD-10-CM | POA: Diagnosis not present

## 2023-01-11 DIAGNOSIS — K449 Diaphragmatic hernia without obstruction or gangrene: Secondary | ICD-10-CM | POA: Diagnosis not present

## 2023-01-11 DIAGNOSIS — M549 Dorsalgia, unspecified: Secondary | ICD-10-CM | POA: Diagnosis not present

## 2023-01-11 LAB — COMPREHENSIVE METABOLIC PANEL
ALT: 12 U/L (ref 0–44)
AST: 20 U/L (ref 15–41)
Albumin: 3.7 g/dL (ref 3.5–5.0)
Alkaline Phosphatase: 62 U/L (ref 38–126)
Anion gap: 13 (ref 5–15)
BUN: 16 mg/dL (ref 8–23)
CO2: 30 mmol/L (ref 22–32)
Calcium: 9.6 mg/dL (ref 8.9–10.3)
Chloride: 98 mmol/L (ref 98–111)
Creatinine, Ser: 0.94 mg/dL (ref 0.44–1.00)
GFR, Estimated: 60 mL/min — ABNORMAL LOW (ref >60)
Glucose, Bld: 125 mg/dL — ABNORMAL HIGH (ref 70–99)
Potassium: 2.8 mmol/L — ABNORMAL LOW (ref 3.5–5.1)
Sodium: 141 mmol/L (ref 135–145)
Total Bilirubin: 0.6 mg/dL (ref 0.3–1.2)
Total Protein: 6.5 g/dL (ref 6.5–8.1)

## 2023-01-11 LAB — URINALYSIS, ROUTINE W REFLEX MICROSCOPIC
Bilirubin Urine: NEGATIVE
Glucose, UA: NEGATIVE mg/dL
Ketones, ur: NEGATIVE mg/dL
Nitrite: NEGATIVE
Protein, ur: 100 mg/dL — AB
RBC / HPF: >50 RBC/hpf (ref 0–5)
Specific Gravity, Urine: 1.016 (ref 1.005–1.030)
WBC, UA: >50 WBC/hpf (ref 0–5)
pH: 7 (ref 5.0–8.0)

## 2023-01-11 LAB — CBC WITH DIFFERENTIAL/PLATELET
Abs Immature Granulocytes: 0.02 10*3/uL (ref 0.00–0.07)
Basophils Absolute: 0 10*3/uL (ref 0.0–0.1)
Basophils Relative: 0 %
Eosinophils Absolute: 0.2 10*3/uL (ref 0.0–0.5)
Eosinophils Relative: 3 %
HCT: 32.2 % — ABNORMAL LOW (ref 36.0–46.0)
Hemoglobin: 10.1 g/dL — ABNORMAL LOW (ref 12.0–15.0)
Immature Granulocytes: 0 %
Lymphocytes Relative: 22 %
Lymphs Abs: 1.1 10*3/uL (ref 0.7–4.0)
MCH: 29.6 pg (ref 26.0–34.0)
MCHC: 31.4 g/dL (ref 30.0–36.0)
MCV: 94.4 fL (ref 80.0–100.0)
Monocytes Absolute: 0.6 10*3/uL (ref 0.1–1.0)
Monocytes Relative: 12 %
Neutro Abs: 3.3 10*3/uL (ref 1.7–7.7)
Neutrophils Relative %: 63 %
Platelets: 211 10*3/uL (ref 150–400)
RBC: 3.41 MIL/uL — ABNORMAL LOW (ref 3.87–5.11)
RDW: 13.8 % (ref 11.5–15.5)
WBC: 5.2 10*3/uL (ref 4.0–10.5)
nRBC: 0 % (ref 0.0–0.2)

## 2023-01-11 LAB — LIPASE, BLOOD: Lipase: 28 U/L (ref 11–51)

## 2023-01-11 MED ORDER — FENTANYL CITRATE PF 50 MCG/ML IJ SOSY
50.0000 ug | PREFILLED_SYRINGE | Freq: Once | INTRAMUSCULAR | Status: AC
  Administered 2023-01-11: 50 ug via INTRAVENOUS
  Filled 2023-01-11: qty 1

## 2023-01-11 MED ORDER — OXYCODONE HCL 5 MG PO TABS
2.5000 mg | ORAL_TABLET | Freq: Four times a day (QID) | ORAL | 0 refills | Status: DC | PRN
Start: 2023-01-11 — End: 2023-02-15

## 2023-01-11 MED ORDER — CEPHALEXIN 500 MG PO CAPS: 500.0000 mg | ORAL_CAPSULE | Freq: Four times a day (QID) | ORAL | 0 refills | Status: DC

## 2023-01-11 MED ORDER — CEPHALEXIN 500 MG PO CAPS
500.0000 mg | ORAL_CAPSULE | Freq: Once | ORAL | Status: AC
  Administered 2023-01-11: 500 mg via ORAL
  Filled 2023-01-11: qty 1

## 2023-01-11 MED ORDER — POTASSIUM CHLORIDE CRYS ER 20 MEQ PO TBCR
40.0000 meq | EXTENDED_RELEASE_TABLET | Freq: Once | ORAL | Status: AC
  Administered 2023-01-11: 40 meq via ORAL
  Filled 2023-01-11: qty 2

## 2023-01-11 MED ORDER — POTASSIUM CHLORIDE CRYS ER 20 MEQ PO TBCR: 20.0000 meq | EXTENDED_RELEASE_TABLET | Freq: Two times a day (BID) | ORAL | 0 refills | Status: DC

## 2023-01-11 MED ORDER — KETOROLAC TROMETHAMINE 15 MG/ML IJ SOLN
15.0000 mg | Freq: Once | INTRAMUSCULAR | Status: AC
  Administered 2023-01-11: 15 mg via INTRAVENOUS
  Filled 2023-01-11: qty 1

## 2023-01-11 MED ORDER — ONDANSETRON HCL 4 MG/2ML IJ SOLN
4.0000 mg | Freq: Once | INTRAMUSCULAR | Status: AC
  Administered 2023-01-11: 4 mg via INTRAVENOUS
  Filled 2023-01-11: qty 2

## 2023-01-11 NOTE — ED Provider Notes (Signed)
[place] EMERGENCY DEPARTMENT AT [facility] Provider Note   CSN: [id number] Arrival date & time: 01/11/23  4098     History  No chief complaint on file.   Sara Fischer is a 82 y.o. female.  Patient with history of small bowel obstruction, hernia, kidney stone, diverticulitis, colonoscopy 2 weeks ago without abnormality other than diverticulosis throughout entire colon, normocytic anemia, hypertension--presents to the emergency department today for evaluation of right-sided flank and abdominal pain.  Symptoms started around 7:30 AM.  No falls or injuries.  No associated nausea, vomiting, or diarrhea.  Patient did have a little in bowel movement.  She urinated and urine appeared normal.  Pain reminds her somewhat of remote kidney stones.  No fevers.  Blood pressure elevated with EMS and then on arrival.  Patient reports not having taken her blood pressure medications this morning.       Home Medications Prior to Admission medications   Medication Sig Start Date End Date Taking? Authorizing Provider  pantoprazole (PROTONIX) 40 MG tablet Take 1 tablet (40 mg total) by mouth 2 (two) times daily before a meal. 12/26/22   Rourk, Gerrit Friends, MD  aspirin EC 81 MG tablet Take 81 mg by mouth daily.    [provider]  Cholecalciferol (D3-1000 PO) Take 1 capsule by mouth daily.    [provider]  Cyanocobalamin (VITAMIN B 12) 250 MCG LOZG Take by mouth.    [provider]  fluticasone (FLONASE) 50 MCG/ACT nasal spray Place 1 spray into both nostrils daily as needed for allergies or rhinitis.    [provider]  furosemide (LASIX) 40 MG tablet  12/08/19   [provider]  ketoconazole (NIZORAL) 2 % cream Apply 1 application topically 2 (two) times daily as needed.  06/04/19   [provider]  Menaquinone-7 (VITAMIN K2 PO) Take 1 tablet by mouth daily.    [provider]  metoprolol succinate (TOPROL-XL) 100 MG 24 hr tablet  Take 100 mg by mouth daily. 09/10/17   [provider]  Propylene Glycol 0.6 % SOLN Place 1 drop into both eyes 2 (two) times daily as needed (dry eyes).    [provider]  simvastatin (ZOCOR) 20 MG tablet Take 20 mg by mouth at bedtime.     [provider]      Allergies    Feldene [piroxicam] and Lodine [etodolac]    Review of Systems   Review of Systems  Physical Exam Updated Vital Signs BP (!) 187/66 (BP Location: Left Arm)   Pulse 77   Temp 97.6 F (36.4 C) (Oral)   Resp 18   Ht 5\' 2"  (1.575 m)   Wt 66.7 kg   SpO2 98%   BMI 26.89 kg/m  Physical Exam Vitals and nursing note reviewed.  Constitutional:      General: She is not in acute distress.    Appearance: She is well-developed.  HENT:     Head: Normocephalic and atraumatic.     Right Ear: External ear normal.     Left Ear: External ear normal.     Nose: Nose normal.  Eyes:     Conjunctiva/sclera: Conjunctivae normal.  Cardiovascular:     Rate and Rhythm: Normal rate and regular rhythm.     Heart sounds: No murmur heard. Pulmonary:     Effort: No respiratory distress.     Breath sounds: No wheezing, rhonchi or rales.  Abdominal:     Palpations: Abdomen is soft.  Tenderness: There is abdominal tenderness in the right lower quadrant. There is no guarding or rebound.    Musculoskeletal:     Cervical back: Normal range of motion and neck supple.     Right lower leg: No edema.     Left lower leg: No edema.  Skin:    General: Skin is warm and dry.     Findings: No rash.  Neurological:     General: No focal deficit present.     Mental Status: She is alert. Mental status is at baseline.     Motor: No weakness.  Psychiatric:        Mood and Affect: Mood normal.     ED Results / Procedures / Treatments   Labs (all labs ordered are listed, but only abnormal results are displayed) Labs Reviewed  URINALYSIS, ROUTINE W REFLEX MICROSCOPIC - Abnormal; Notable for the following  components:      Result Value   APPearance HAZY (*)    Hgb urine dipstick LARGE (*)    Protein, ur 100 (*)    Leukocytes,Ua MODERATE (*)    Bacteria, UA RARE (*)    All other components within normal limits  CBC WITH DIFFERENTIAL/PLATELET - Abnormal; Notable for the following components:   RBC 3.41 (*)    Hemoglobin 10.1 (*)    HCT 32.2 (*)    All other components within normal limits  COMPREHENSIVE METABOLIC PANEL - Abnormal; Notable for the following components:   Potassium 2.8 (*)    Glucose, Bld 125 (*)    GFR, Estimated 60 (*)    All other components within normal limits  URINE CULTURE  LIPASE, BLOOD    EKG None  Radiology CT Renal Stone Study  Result Date: 01/11/2023 CLINICAL DATA:  Right-sided flank pain EXAM: CT ABDOMEN AND PELVIS WITHOUT CONTRAST TECHNIQUE: Multidetector CT imaging of the abdomen and pelvis was performed following the standard protocol without IV contrast. RADIATION DOSE REDUCTION: This exam was performed according to the departmental dose-optimization program which includes automated exposure control, adjustment of the mA and/or kV according to patient size and/or use of iterative reconstruction technique. COMPARISON:  Contrast CT 07/13/2020. FINDINGS: Lower chest: There is slight linear opacity lung bases likely scar or atelectasis. No pleural effusion. Moderate hiatal hernia. Hepatobiliary: No focal liver abnormality is seen. No gallstones, gallbladder wall thickening, or biliary dilatation. Pancreas: Unremarkable. No pancreatic ductal dilatation or surrounding inflammatory changes. Spleen: Normal in size without focal abnormality. Adrenals/Urinary Tract: Adrenal glands are preserved. No abnormal calcification seen in the left kidney nor along the course of the left ureter. No right-sided renal collecting system dilatation. Exophytic from the medial aspect of the left kidney is a 15 mm low-attenuation structure with Hounsfield units of 10 consistent with a  Bosniak 1 cyst. No specific imaging follow-up. There is moderate perinephric stranding on the right with mild collecting system dilatation. An ectatic ureter seen down to the UVJ where there is punctate stone. Separate punctate lower pole stone in the right kidney best seen on coronal imaging. Preserved contours of the urinary bladder. Stomach/Bowel: On this non oral contrast exam, the large bowel has a normal course and caliber with scattered stool left-sided colonic diverticulosis. Small bowel is nondilated. No free air or free fluid. Once again there is a moderate hiatal hernia. Vascular/Lymphatic: Diffuse vascular calcifications identified including along the branch vessels. Potential areas of significant stenosis are suggested. Please correlate with any particular symptoms. Normal caliber aorta. No specific abnormal lymph node enlargement  identified in the abdomen and pelvis. Reproductive: Status post hysterectomy. No adnexal masses. Other: Previous anterior pelvic wall hernia repair. Small umbilical hernia. No free air. No significant free fluid. Musculoskeletal: Osteopenia. Scattered degenerative changes of the spine. 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 IMPRESSION: Right-sided renal collecting system dilatation with perinephric stranding and a punctate right UVJ stone. Colonic diverticulosis. Hiatal hernia. Electronically Signed   By: Karen Kays M.D.   On: 01/11/2023 11:54    Procedures Procedures    Medications Ordered in ED Medications  fentaNYL (SUBLIMAZE) injection 50 mcg (50 mcg Intravenous Given 01/11/23 0947)  ondansetron (ZOFRAN) injection 4 mg (4 mg Intravenous Given 01/11/23 0946)  fentaNYL (SUBLIMAZE) injection 50 mcg (50 mcg Intravenous Given 01/11/23 1051)  ketorolac (TORADOL) 15 MG/ML injection 15 mg (15 mg Intravenous Given 01/11/23 1228)  cephALEXin (KEFLEX) capsule 500 mg (500 mg Oral Given 01/11/23 1346)  potassium chloride SA (KLOR-CON M) CR tablet 40 mEq (40 mEq Oral Given  01/11/23 1346)    ED Course/ Medical Decision Making/ A&P    Patient seen and examined. History obtained directly from patient and family member at bedside.  I reviewed recent colonoscopy results.  Labs/EKG: Ordered CBC, CMP, UA.  Imaging: Ordered CT renal protocol.  Medications/Fluids: Ordered: Fentanyl 50 mcg, Zofran 4 mg.   Most recent vital signs reviewed and are as follows: BP (!) 187/66 (BP Location: Left Arm)   Pulse 77   Temp 97.6 F (36.4 C) (Oral)   Resp 18   Ht 5\' 2"  (1.575 m)   Wt 66.7 kg   SpO2 98%   BMI 26.89 kg/m   Initial impression: Right-sided abdominal pain  2:03 PM Reassessment performed. Patient appears improved on several rechecks in the interim.  Best relief after IV Toradol.  She states that she is hungry and is looking forward to getting something to eat.  I did discuss patient with Dr. Suezanne Jacquet. I spoke with Dr. Ronne Binning of urology in regards to the UA findings.  Discussed no fevers, elevated white blood cell count, vomiting to suggest pyelonephritis.  Recommends oral Keflex or Bactrim, return to the emergency department for fever, call urology office for an appointment.  Labs personally reviewed and interpreted including: CBC with normal white blood cell count, mild anemia with hemoglobin 10.1; CMP potassium 2.8; lipase normal at 28.  Urinalysis is a clean-catch with rare bacteria, greater than 50 red cells, greater than 50 white cells and moderate leukocytes, negative nitrite.  Imaging personally visualized and interpreted including: CT renal protocol, agree punctate stone noted right UVJ with dilated ureter and hydronephrosis.  Reviewed pertinent lab work and imaging with patient at bedside. Questions answered.   Most current vital signs reviewed and are as follows: BP (!) 146/62   Pulse (!) 59   Temp 98.4 F (36.9 C) (Axillary)   Resp 14   Ht 5\' 2"  (1.575 m)   Wt 66.7 kg   SpO2 98%   BMI 26.89 kg/m   Plan: Discharge to home.    Prescriptions written for: Keflex, potassium supplementation   Patient counseled on kidney stone treatment. Urged patient to strain urine and save any stones. Urged urology follow-up and return to [facility] with any complications. Counseled patient to maintain good fluid intake.   Patient counseled on use of narcotic pain medications. Counseled not to combine these medications with others containing tylenol. Urged not to drink alcohol, drive, or perform any other activities that requires focus while taking these medications. The patient verbalizes understanding and agrees  with the plan.                                Medical Decision Making Amount and/or Complexity of Data Reviewed Labs: ordered. Radiology: ordered.  Risk Prescription drug management.   Patient with abrupt onset of right-sided abdominal pain, likely due to distal ureteral stone noted on CT.  Patient does have pyuria without other hard signs of UTI or pyelonephritis.  Recommendations obtained from urology.  Feel that these are reasonable.  Patient will be on oral antibiotics and monitor for worsening infectious symptoms at home.  Encouraged outpatient follow-up.  Patient's pain is controlled during ED stay.  She has not vomited.  She does have some hypokalemia likely due to her diuretic use.  She will be placed on several days of potassium supplementation.  The patient's vital signs, pertinent lab work and imaging were reviewed and interpreted as discussed in the ED course. Hospitalization was considered for further testing, treatments, or serial exams/observation. However as patient is well-appearing, has a stable exam, and reassuring studies today, I do not feel that they warrant admission at this time. This plan was discussed with the patient who verbalizes agreement and comfort with this plan and seems reliable and able to return to the Emergency Department with worsening or changing symptoms.          Final Clinical  Impression(s) / ED Diagnoses Final diagnoses:  Ureteral colic  Pyuria  Hypokalemia    Rx / DC Orders ED Discharge Orders          Ordered    oxyCODONE (OXY IR/ROXICODONE) 5 MG immediate release tablet  Every 6 hours PRN        01/11/23 1400    cephALEXin (KEFLEX) 500 MG capsule  4 times daily        01/11/23 1400    potassium chloride SA (KLOR-CON M) 20 MEQ tablet  2 times daily        01/11/23 1400              Renne Crigler, PA-C 01/11/23 1409    Lonell Grandchild, MD 01/11/23 1502

## 2023-01-11 NOTE — ED Triage Notes (Signed)
Pt brought in by [facility] from home with c/o stabbing pain from right lower flank to right inguinal area that started at 0730 this morning. Reports pain feels similar to when she's had kidney stones in the past. BP 202/72, HR 91, O2 sat 99% RA, temp 97.3 for EMS. Denies hematuria. Reports the pain is worse with sitting.

## 2023-01-11 NOTE — Discharge Instructions (Signed)
Please read and follow all provided instructions.  Your diagnoses today include:  1. Ureteral colic   2. Pyuria   3. Hypokalemia     Tests performed today include: Urine test that showed blood in your urine and some white blood cells which suggests infection CT scan which showed a very small kidney stone on the right side Blood test that showed normal kidney function Vital signs. See below for your results today.   Medications prescribed:  Oxycodone - narcotic pain medication  DO NOT drive or perform any activities that require you to be awake and alert because this medicine can make you drowsy.   Use pain medication only under direct supervision at the lowest possible dose needed to control your pain.   Keflex (cephalexin) - antibiotic  You have been prescribed an antibiotic medicine: take the entire course of medicine even if you are feeling better. Stopping early can cause the antibiotic not to work.  Potassium supplement  Take any prescribed medications only as directed.  Home care instructions:  Follow any educational materials contained in this packet.  Please double your fluid intake for the next several days. Strain your urine and save any stones that may pass.   BE VERY CAREFUL not to take multiple medicines containing Tylenol (also called acetaminophen). Doing so can lead to an overdose which can damage your liver and cause liver failure and possibly death.   Follow-up instructions: Please follow-up with your urologist or the urologist referral (provided on front page) in the next 1 week for further evaluation of your symptoms.  Return instructions:  Please return to the Emergency Department if you experience worsening symptoms.  Please return if you develop fever or uncontrolled pain or vomiting. Please return if you have any other emergent concerns.  Additional Information:  Your vital signs today were: BP (!) 146/62   Pulse (!) 59   Temp 98.4 F (36.9 C)  (Axillary)   Resp 14   Ht 5\' 2"  (1.575 m)   Wt 66.7 kg   SpO2 98%   BMI 26.89 kg/m  If your blood pressure (BP) was elevated above 135/85 this visit, please have this repeated by your doctor within one month. --------------

## 2023-01-12 LAB — URINE CULTURE: Culture: <10000 — AB

## 2023-01-15 NOTE — Progress Notes (Unsigned)
[facility] [address], [place] [address]   CLINIC:  Medical Oncology/Hematology  PCP:  Elfredia Nevins, MD [address] [place] [address] [phone]   REASON FOR VISIT:  Follow-up for iron deficiency anemia   CURRENT THERAPY: Oral iron tablets and intermittent IV iron   INTERVAL HISTORY:   Sara Fischer 82 y.o. female returns for routine follow-up of her iron deficiency anemia.  She was last seen by Rojelio Brenner PA-C on 11/13/2022.     At today's visit, she reports feeling fairly well.   She reports feeling well after IV iron (Venofer 400 mg x 3 in September 2024).    She denies any fatigue, pica, restless legs, headaches, lightheadedness, syncope.  She has not had any chest pain or dyspnea on exertion.  She denies any frank hematochezia or obvious melena.    She takes B12 tablet daily and getting B12 shots monthly from Dr. Sherwood Gambler.  She stopped taking iron due to side effects.  She takes aspirin 81 mg daily.    She continues to remain active and goes dancing at least 4 nights per week, and also enjoys spending time with her great-grandchildren.   She has 100% energy and 100% appetite. She endorses that she is maintaining a stable weight.  ASSESSMENT & PLAN:  1.  Normocytic anemia with iron deficiency - Referred by primary care provider (Dr. Sherwood Gambler) in March 2022 - CBC on 04/28/2019 at Dr. Sharyon Medicus office showed hemoglobin 9.5, MCV of 89. White count was slightly low at 3.4 and platelets were 229. Differential was normal.  Ferritin was 15.  Normal B12 and folic acid. Percent saturation was 9.0, reticulocyte was 2% - Hemoccult POSITIVE 3/3 in September 2024 - EGD (12/26/2022): Normal esophagus, large hiatal hernia, antral ulcer with satellite erosions - Colonoscopy (12/26/2022): Diverticulosis in entire colon, otherwise normal exam - SPEP negative, LDH normal, stool occult blood normal.  Normal creatinine without evidence of kidney dysfunction. - She  was unable to tolerate oral iron due to constipation. - Vitamin B12 is being managed by her PCP, currently taking daily supplement and receiving monthly B12 injections. - Most recent IV iron with Venofer 1200 mg in September 2024 - Most recent labs (01/16/2023): Hgb 11.4/MCV 97.6, ferritin 121, iron saturation 15% - No bright red blood per rectum or melena.   - PLAN: Recommend IV Venofer 400 mg x 1. - Labs (CBC/D, ferritin, iron/TIBC) and RTC in 3 months  PLAN SUMMARY:  >> Venofer 400 mg x 1 >> Same-day labs (CBC/D, ferritin, iron/TIBC) + OFFICE visit in 3 months    REVIEW OF SYSTEMS:   Review of Systems  Constitutional:  Negative for appetite change, chills, diaphoresis, fatigue, fever and unexpected weight change.  HENT:   Negative for lump/mass and nosebleeds.   Eyes:  Negative for eye problems.  Respiratory:  Negative for cough, hemoptysis and shortness of breath.   Cardiovascular:  Negative for chest pain, leg swelling and palpitations.  Gastrointestinal:  Positive for constipation. Negative for abdominal pain, blood in stool, diarrhea, nausea and vomiting.  Genitourinary:  Positive for difficulty urinating (when passing kidney stone). Negative for hematuria.   Skin: Negative.   Neurological:  Positive for numbness (fingertips). Negative for dizziness, headaches and light-headedness.  Hematological:  Does not bruise/bleed easily.     PHYSICAL EXAM:  ECOG PERFORMANCE STATUS: 0 - Asymptomatic  Vitals:   01/16/23 1001  BP: (!) 160/70  Pulse: 71  Resp: 17  Temp: 98.5 F (36.9  C)  SpO2: 99%   Filed Weights   01/16/23 1001  Weight: 149 lb 3.2 oz (67.7 kg)   Physical Exam Constitutional:      Appearance: Normal appearance. She is normal weight.  Cardiovascular:     Heart sounds: Normal heart sounds.  Pulmonary:     Breath sounds: Normal breath sounds.  Neurological:     General: No focal deficit present.     Mental Status: Mental status is at baseline.  Psychiatric:         Behavior: Behavior normal. Behavior is cooperative.    PAST MEDICAL/SURGICAL HISTORY:  Past Medical History:  Diagnosis Date   High cholesterol    Hypertension    IDA (iron deficiency anemia)    Iron deficiency anemia due to chronic blood loss 01/16/2023   Past Surgical History:  Procedure Laterality Date   ABDOMINAL HYSTERECTOMY     APPENDECTOMY     BIOPSY  12/26/2022   Procedure: BIOPSY;  Surgeon: Corbin Ade, MD;  [facility];  Service: Endoscopy;;   COLONOSCOPY  2010   Dr. Jena Gauss: pancolonic diverticulosis, sigmoid colon tubular adenoma removed.    COLONOSCOPY N/A 02/29/2016   Procedure: COLONOSCOPY;  Surgeon: Corbin Ade, MD;  Location: [facility];  Service: Endoscopy;  Laterality: N/A;  230   COLONOSCOPY WITH PROPOFOL N/A 12/26/2022   Procedure: COLONOSCOPY WITH PROPOFOL;  Surgeon: Corbin Ade, MD;  [facility];  Service: Endoscopy;  Laterality: N/A;  1230pm, asa 3   ESOPHAGOGASTRODUODENOSCOPY (EGD) WITH PROPOFOL N/A 12/26/2022   Procedure: ESOPHAGOGASTRODUODENOSCOPY (EGD) WITH PROPOFOL;  Surgeon: Corbin Ade, MD;  [facility];  Service: Endoscopy;  Laterality: N/A;   INCISIONAL HERNIA REPAIR N/A 12/28/2016   Procedure: HERNIA REPAIR INCISIONAL;  Surgeon: Franky Macho, MD;  Location: [facility];  Service: General;  Laterality: N/A;   KNEE ARTHROSCOPY     bilateral   LAPAROSCOPIC BILATERAL SALPINGO OOPHERECTOMY  2001   PARTIAL HYSTERECTOMY  1979    SOCIAL HISTORY:  Social History   Socioeconomic History   Marital status: Widowed    Spouse name: Not on file   Number of children: 4   Years of education: Not on file   Highest education level: Not on file  Occupational History   Occupation: Retired  Tobacco Use   Smoking status: Former    Current packs/day: 0.00    Average packs/day: 0.5 packs/day for 5.2 years (2.6 ttl pk-yrs)    Types: Cigarettes    Start date: 08/28/1963    Quit date: 11/02/1968    Years  since quitting: 54.2   Smokeless tobacco: Never  Vaping Use   Vaping status: Never Used  Substance and Sexual Activity   Alcohol use: Not Currently    Comment: wine occas.   Drug use: No   Sexual activity: Not on file  Other Topics Concern   Not on file  Social History Narrative   Right handed    Caffeine use: 1 cup coffee every morning   1 soda per day   Social Determinants of Health   Financial Resource Strain: Not on file  Food Insecurity: Not on file  Transportation Needs: Not on file  Physical Activity: Not on file  Stress: Not on file  Social Connections: Not on file  Intimate Partner Violence: Not on file    FAMILY HISTORY:  Family History  Problem Relation Age of Onset   Diabetes Mother    Heart disease Mother  Heart disease Father 14   Dementia Sister    Heart disease Brother    Diabetes Sister    Heart disease Sister    Heart disease Sister    Diabetes Sister    Arthritis Sister    Heart disease Sister    Heart disease Brother    Healthy Son    Healthy Son    Colon cancer Neg Hx     CURRENT MEDICATIONS:  Outpatient Encounter Medications as of 01/16/2023  Medication Sig   aspirin EC 81 MG tablet Take 81 mg by mouth daily.   cephALEXin (KEFLEX) 500 MG capsule Take 1 capsule (500 mg total) by mouth 4 (four) times daily.   Cholecalciferol (D3-1000 PO) Take 1 capsule by mouth daily.   Cyanocobalamin (VITAMIN B 12) 250 MCG LOZG Take by mouth.   fluticasone (FLONASE) 50 MCG/ACT nasal spray Place 1 spray into both nostrils daily as needed for allergies or rhinitis.   furosemide (LASIX) 40 MG tablet    ketoconazole (NIZORAL) 2 % cream Apply 1 application topically 2 (two) times daily as needed.    Menaquinone-7 (VITAMIN K2 PO) Take 1 tablet by mouth daily.   metoprolol succinate (TOPROL-XL) 100 MG 24 hr tablet Take 100 mg by mouth daily.   oxyCODONE (OXY IR/ROXICODONE) 5 MG immediate release tablet Take 0.5 tablets (2.5 mg total) by mouth every 6 (six)  hours as needed for severe pain (pain score 7-10).   pantoprazole (PROTONIX) 40 MG tablet Take 1 tablet (40 mg total) by mouth 2 (two) times daily before a meal.   potassium chloride SA (KLOR-CON M) 20 MEQ tablet Take 1 tablet (20 mEq total) by mouth 2 (two) times daily.   Propylene Glycol 0.6 % SOLN Place 1 drop into both eyes 2 (two) times daily as needed (dry eyes).   simvastatin (ZOCOR) 20 MG tablet Take 20 mg by mouth at bedtime.    No facility-administered encounter medications on file as of 01/16/2023.    ALLERGIES:  Allergies  Allergen Reactions   Feldene [Piroxicam]     Headaches    Lodine [Etodolac]     headaches    LABORATORY DATA:  I have reviewed the labs as listed.  CBC    Component Value Date/Time   WBC 5.1 01/16/2023 0916   RBC 3.73 (L) 01/16/2023 0916   HGB 11.4 (L) 01/16/2023 0916   HCT 36.4 01/16/2023 0916   PLT 238 01/16/2023 0916   MCV 97.6 01/16/2023 0916   MCH 30.6 01/16/2023 0916   MCHC 31.3 01/16/2023 0916   RDW 14.0 01/16/2023 0916   LYMPHSABS 1.7 01/16/2023 0916   MONOABS 0.7 01/16/2023 0916   EOSABS 0.3 01/16/2023 0916   BASOSABS 0.0 01/16/2023 0916      Latest Ref Rng & Units 01/11/2023    9:43 AM 12/24/2022   10:33 AM 10/26/2022   10:46 AM  CMP  Glucose 70 - 99 mg/dL 401  98  027   BUN 8 - 23 mg/dL 16  17  16    Creatinine 0.44 - 1.00 mg/dL 2.53  6.64  4.03   Sodium 135 - 145 mmol/L 141  139  139   Potassium 3.5 - 5.1 mmol/L 2.8  3.3  3.5   Chloride 98 - 111 mmol/L 98  104  101   CO2 22 - 32 mmol/L 30  27  29    Calcium 8.9 - 10.3 mg/dL 9.6  9.2  9.9   Total Protein 6.5 - 8.1 g/dL 6.5  7.0   Total Bilirubin 0.3 - 1.2 mg/dL 0.6   0.7   Alkaline Phos 38 - 126 U/L 62   57   AST 15 - 41 U/L 20   18   ALT 0 - 44 U/L 12   13     DIAGNOSTIC IMAGING:  I have independently reviewed the relevant imaging and discussed with the patient.   WRAP UP:  All questions were answered. The patient knows to call the clinic with any problems,  questions or concerns.  Medical decision making: Low  Time spent on visit: I spent 15 minutes counseling the patient face to face. The total time spent in the appointment was 22 minutes and more than 50% was on counseling.  Carnella Guadalajara, PA-C  01/16/23 10:40 AM

## 2023-01-16 ENCOUNTER — Inpatient Hospital Stay: Payer: Medicare HMO | Attending: Physician Assistant | Admitting: Physician Assistant

## 2023-01-16 ENCOUNTER — Inpatient Hospital Stay: Payer: Medicare HMO

## 2023-01-16 ENCOUNTER — Encounter: Payer: Self-pay | Admitting: Physician Assistant

## 2023-01-16 VITALS — BP 160/70 | HR 71 | Temp 98.5°F | Resp 17 | Wt 149.2 lb

## 2023-01-16 DIAGNOSIS — D509 Iron deficiency anemia, unspecified: Secondary | ICD-10-CM

## 2023-01-16 DIAGNOSIS — Z87891 Personal history of nicotine dependence: Secondary | ICD-10-CM | POA: Diagnosis not present

## 2023-01-16 DIAGNOSIS — D500 Iron deficiency anemia secondary to blood loss (chronic): Secondary | ICD-10-CM | POA: Insufficient documentation

## 2023-01-16 HISTORY — DX: Iron deficiency anemia secondary to blood loss (chronic): D50.0

## 2023-01-16 LAB — CBC WITH DIFFERENTIAL/PLATELET
Abs Immature Granulocytes: 0.02 10*3/uL (ref 0.00–0.07)
Basophils Absolute: 0 10*3/uL (ref 0.0–0.1)
Basophils Relative: 1 %
Eosinophils Absolute: 0.3 10*3/uL (ref 0.0–0.5)
Eosinophils Relative: 5 %
HCT: 36.4 % (ref 36.0–46.0)
Hemoglobin: 11.4 g/dL — ABNORMAL LOW (ref 12.0–15.0)
Immature Granulocytes: 0 %
Lymphocytes Relative: 34 %
Lymphs Abs: 1.7 10*3/uL (ref 0.7–4.0)
MCH: 30.6 pg (ref 26.0–34.0)
MCHC: 31.3 g/dL (ref 30.0–36.0)
MCV: 97.6 fL (ref 80.0–100.0)
Monocytes Absolute: 0.7 10*3/uL (ref 0.1–1.0)
Monocytes Relative: 13 %
Neutro Abs: 2.4 10*3/uL (ref 1.7–7.7)
Neutrophils Relative %: 47 %
Platelets: 238 10*3/uL (ref 150–400)
RBC: 3.73 MIL/uL — ABNORMAL LOW (ref 3.87–5.11)
RDW: 14 % (ref 11.5–15.5)
WBC: 5.1 10*3/uL (ref 4.0–10.5)
nRBC: 0 % (ref 0.0–0.2)

## 2023-01-16 LAB — FERRITIN: Ferritin: 121 ng/mL (ref 11–307)

## 2023-01-16 LAB — IRON AND TIBC
Iron: 56 ug/dL (ref 28–170)
Saturation Ratios: 15 % (ref 10.4–31.8)
TIBC: 375 ug/dL (ref 250–450)
UIBC: 319 ug/dL

## 2023-01-16 LAB — SAMPLE TO BLOOD BANK

## 2023-01-16 NOTE — Patient Instructions (Signed)
[facility] at [facility] **VISIT SUMMARY & IMPORTANT INSTRUCTIONS **   You were seen today by Rojelio Brenner PA-C for your iron deficiency anemia.   Your blood and iron levels look better, but are still lower than what they should be. We will schedule you for 1 dose of IV iron. We will see you for follow-up visit in 3 months. Continue follow-up with Dr. Jena Gauss (gastroenterology) regarding the ulcer found on your recent endoscopy.  His office should be contacting you to schedule an appointment for January 2025.  ** Thank you for trusting me with your healthcare!  I strive to provide all of my patients with quality care at each visit.  If you receive a survey for this visit, I would be so grateful to you for taking the time to provide feedback.  Thank you in advance!  ~ Keldric Poyer                   Dr. Doreatha Massed   &   Rojelio Brenner, PA-C   - - - - - - - - - - - - - - - - - -    Thank you for choosing [facility] at [facility] to provide your oncology and hematology care.  To afford each patient quality time with our provider, please arrive at least 15 minutes before your scheduled appointment time.   If you have a lab appointment with the Cancer Center please come in thru the Main Entrance and check in at the main information desk.  You need to re-schedule your appointment should you arrive 10 or more minutes late.  We strive to give you quality time with our providers, and arriving late affects you and other patients whose appointments are after yours.  Also, if you no show three or more times for appointments you may be dismissed from the clinic at the providers discretion.     Again, thank you for choosing [facility].  Our hope is that these requests will decrease the amount of time that you wait before being seen by our physicians.       _____________________________________________________________  Should you have  questions after your visit to [facility], please contact our office at [phone] and follow the prompts.  Our office hours are 8:00 a.m. and 4:30 p.m. Monday - Friday.  Please note that voicemails left after 4:00 p.m. may not be returned until the following business day.  We are closed weekends and major holidays.  You do have access to a nurse 24-7, just call the main number to the clinic [phone] and do not press any options, hold on the line and a nurse will answer the phone.    For prescription refill requests, have your pharmacy contact our office and allow 72 hours.

## 2023-01-22 ENCOUNTER — Inpatient Hospital Stay: Payer: Medicare HMO

## 2023-01-22 VITALS — BP 146/51 | HR 72 | Temp 97.5°F | Resp 18

## 2023-01-22 DIAGNOSIS — D500 Iron deficiency anemia secondary to blood loss (chronic): Secondary | ICD-10-CM

## 2023-01-22 DIAGNOSIS — D509 Iron deficiency anemia, unspecified: Secondary | ICD-10-CM | POA: Diagnosis not present

## 2023-01-22 DIAGNOSIS — Z87891 Personal history of nicotine dependence: Secondary | ICD-10-CM | POA: Diagnosis not present

## 2023-01-22 DIAGNOSIS — D649 Anemia, unspecified: Secondary | ICD-10-CM

## 2023-01-22 MED ORDER — SODIUM CHLORIDE 0.9 % IV SOLN
INTRAVENOUS | Status: DC
Start: 2023-01-22 — End: 2023-01-22

## 2023-01-22 MED ORDER — ACETAMINOPHEN 325 MG PO TABS: 650.0000 mg | ORAL_TABLET | Freq: Once | ORAL | Status: DC

## 2023-01-22 MED ORDER — CETIRIZINE HCL 10 MG PO TABS: 10.0000 mg | ORAL_TABLET | Freq: Once | ORAL | Status: DC

## 2023-01-22 MED ORDER — SODIUM CHLORIDE 0.9 % IV SOLN
400.0000 mg | Freq: Once | INTRAVENOUS | Status: AC
  Administered 2023-01-22: 400 mg via INTRAVENOUS
  Filled 2023-01-22: qty 400

## 2023-01-22 NOTE — Progress Notes (Signed)
Patient tolerated iron infusion with no complaints voiced.  Peripheral IV site clean and dry with good blood return noted before and after infusion.  Band aid applied.  VSS with discharge and left in satisfactory condition with no s/s of distress noted. All follow ups as scheduled.  Kristal Perl [employer]

## 2023-01-22 NOTE — Patient Instructions (Signed)
[facility] - A DEPT OF [facility]  Discharge Instructions: Thank you for choosing [facility] to provide your oncology and hematology care.  If you have a lab appointment with the Cancer Center - please note that after April 8th, 2024, all labs will be drawn in the cancer center.  You do not have to check in or register with the main entrance as you have in the past but will complete your check-in in the cancer center.  Wear comfortable clothing and clothing appropriate for easy access to any Portacath or PICC line.   We strive to give you quality time with your provider. You may need to reschedule your appointment if you arrive late (15 or more minutes).  Arriving late affects you and other patients whose appointments are after yours.  Also, if you miss three or more appointments without notifying the office, you may be dismissed from the clinic at the provider's discretion.      For prescription refill requests, have your pharmacy contact our office and allow 72 hours for refills to be completed.    Today you received the following chemotherapy and/or immunotherapy agents Venofer      To help prevent nausea and vomiting after your treatment, we encourage you to take your nausea medication as directed.  BELOW ARE SYMPTOMS THAT SHOULD BE REPORTED IMMEDIATELY: *FEVER GREATER THAN 100.4 F (38 C) OR HIGHER *CHILLS OR SWEATING *NAUSEA AND VOMITING THAT IS NOT CONTROLLED WITH YOUR NAUSEA MEDICATION *UNUSUAL SHORTNESS OF BREATH *UNUSUAL BRUISING OR BLEEDING *URINARY PROBLEMS (pain or burning when urinating, or frequent urination) *BOWEL PROBLEMS (unusual diarrhea, constipation, pain near the anus) TENDERNESS IN MOUTH AND THROAT WITH OR WITHOUT PRESENCE OF ULCERS (sore throat, sores in mouth, or a toothache) UNUSUAL RASH, SWELLING OR PAIN  UNUSUAL VAGINAL DISCHARGE OR ITCHING   Items with * indicate a potential emergency and should be followed up  as soon as possible or go to the Emergency Department if any problems should occur.  Please show the CHEMOTHERAPY ALERT CARD or IMMUNOTHERAPY ALERT CARD at check-in to the Emergency Department and triage nurse.  Should you have questions after your visit or need to cancel or reschedule your appointment, please contact [facility] - A DEPT OF [facility] [phone]  and follow the prompts.  Office hours are 8:00 a.m. to 4:30 p.m. Monday - Friday. Please note that voicemails left after 4:00 p.m. may not be returned until the following business day.  We are closed weekends and major holidays. You have access to a nurse at all times for urgent questions. Please call the main number to the clinic [phone] and follow the prompts.  For any non-urgent questions, you may also contact your provider using MyChart. We now offer e-Visits for anyone 58 and older to request care online for non-urgent symptoms. For details visit [email or web address].   Also download the MyChart app! Go to the app store, search "MyChart", open the app, select [place], and log in with your MyChart username and password.

## 2023-01-23 ENCOUNTER — Telehealth: Payer: Self-pay | Admitting: *Deleted

## 2023-01-23 NOTE — Telephone Encounter (Signed)
Pt called and states that she received a letter stated she needs to make a office visit 3 months after her procedure. She states her number is [phone].

## 2023-01-24 NOTE — Telephone Encounter (Signed)
Noted  

## 2023-01-25 DIAGNOSIS — D518 Other vitamin B12 deficiency anemias: Secondary | ICD-10-CM | POA: Diagnosis not present

## 2023-02-05 IMAGING — DX DG ANKLE COMPLETE 3+V*R*
3 series · 3 of 3 positions shown · non-contrast
Comparison: None.

CLINICAL DATA: Pain and swelling

EXAM:
RIGHT ANKLE - COMPLETE 3 VIEW

[ankle ap]
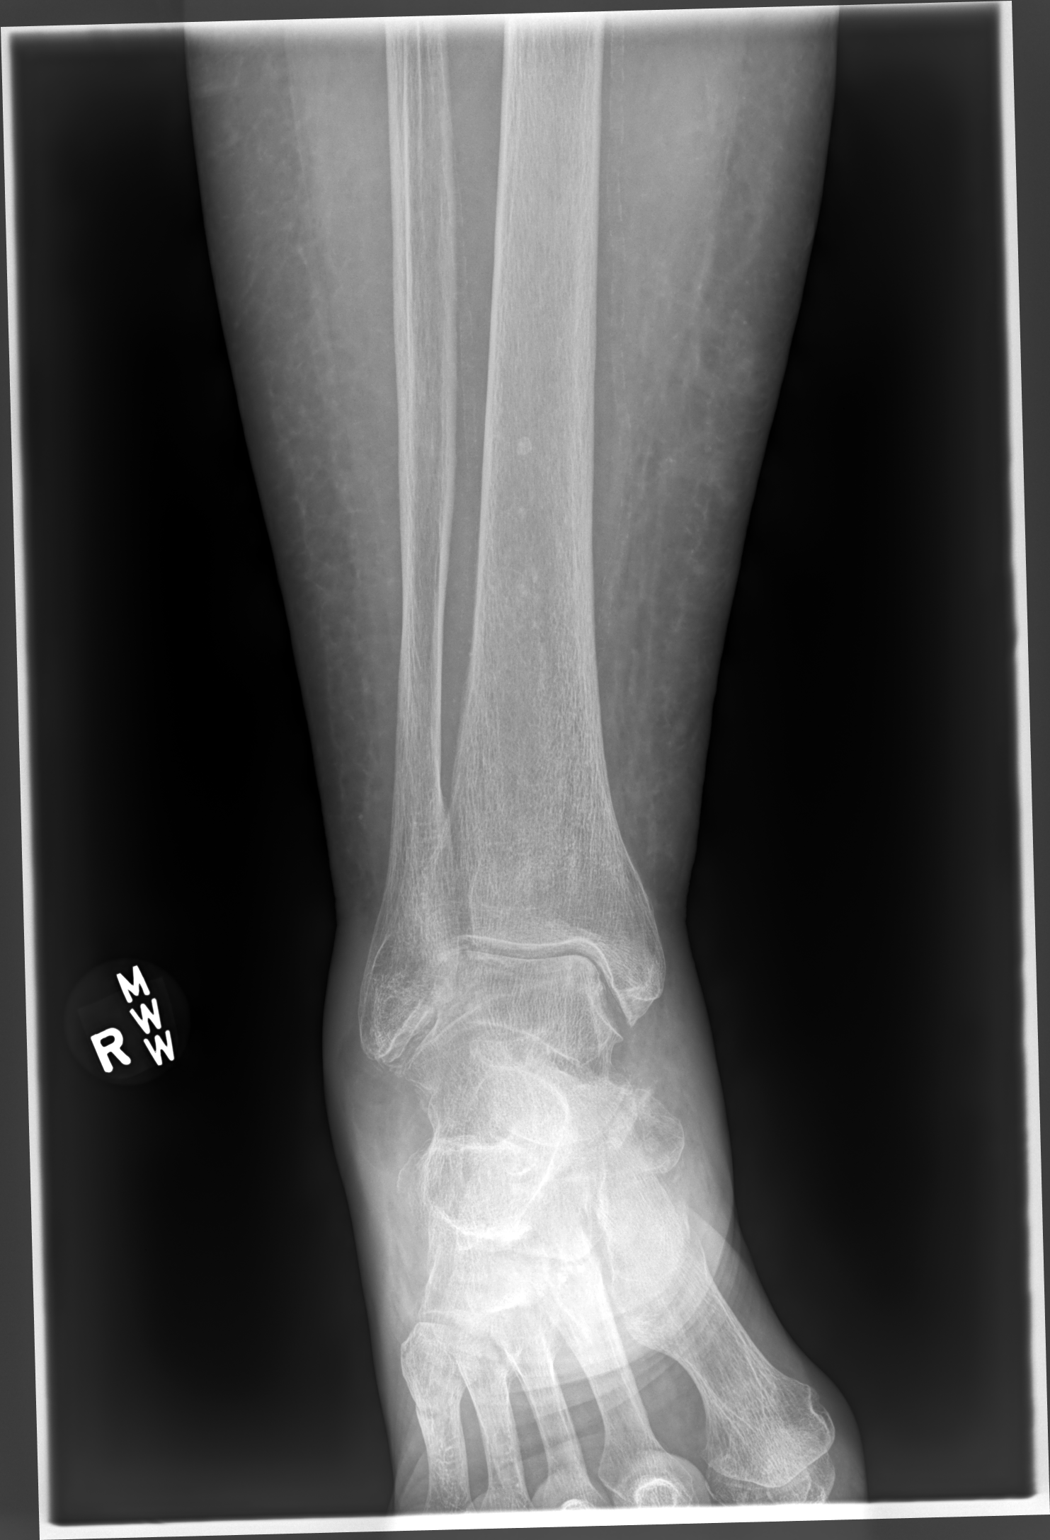

[ankle mlo]
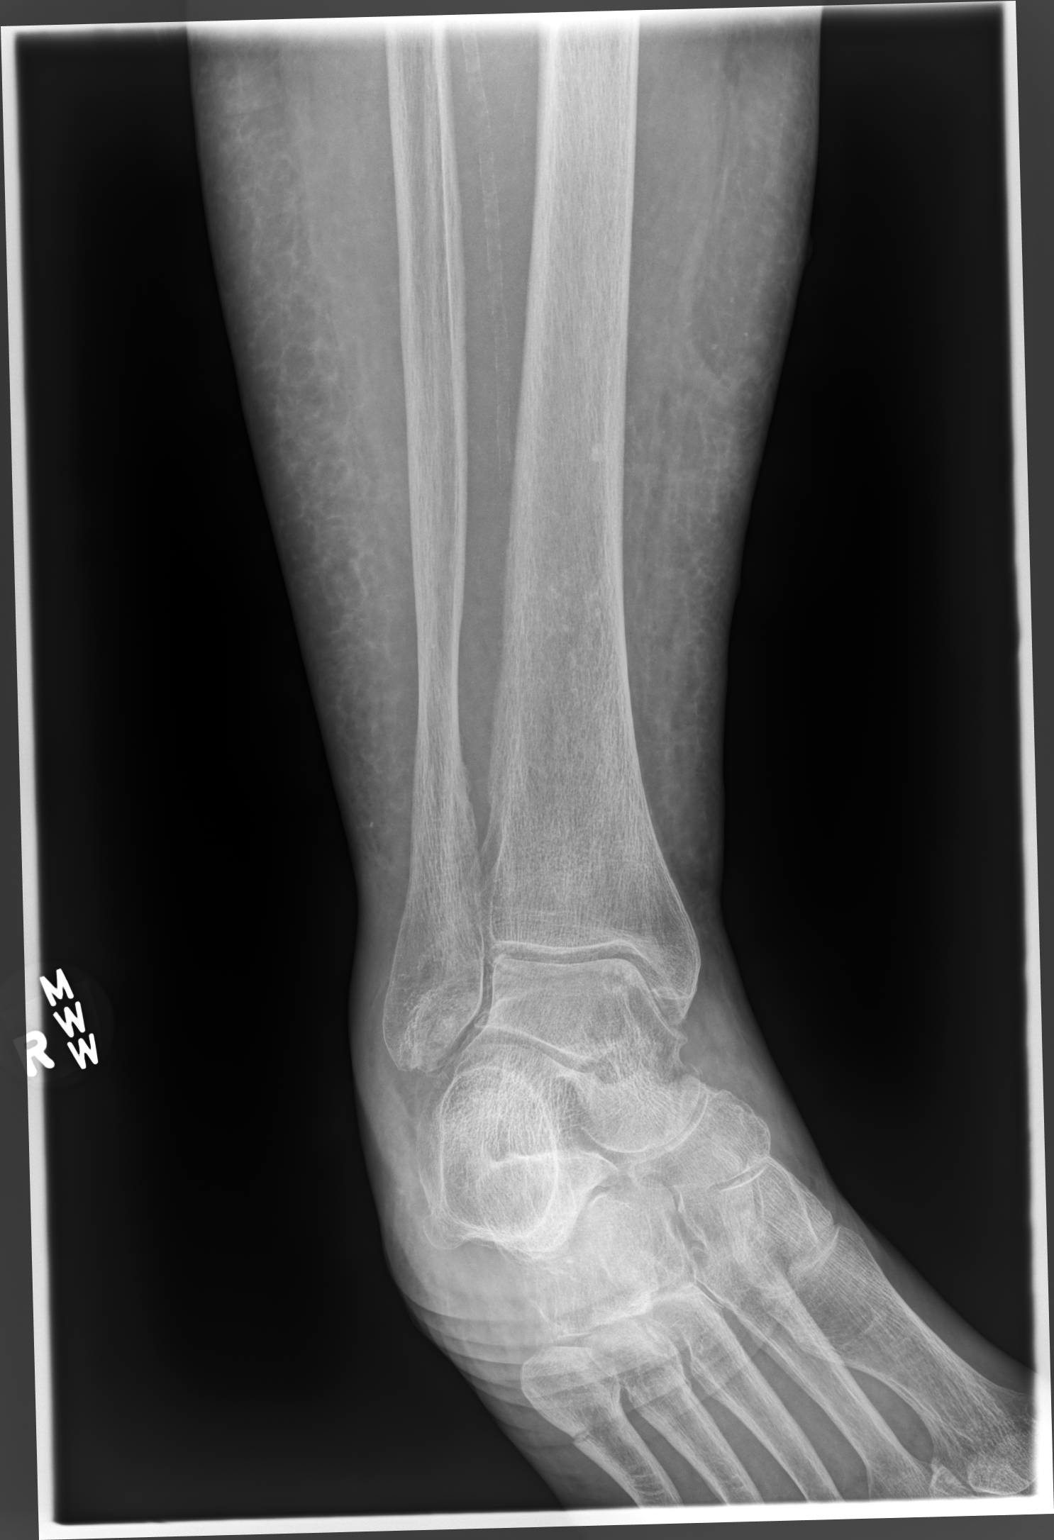

[ankle lat]
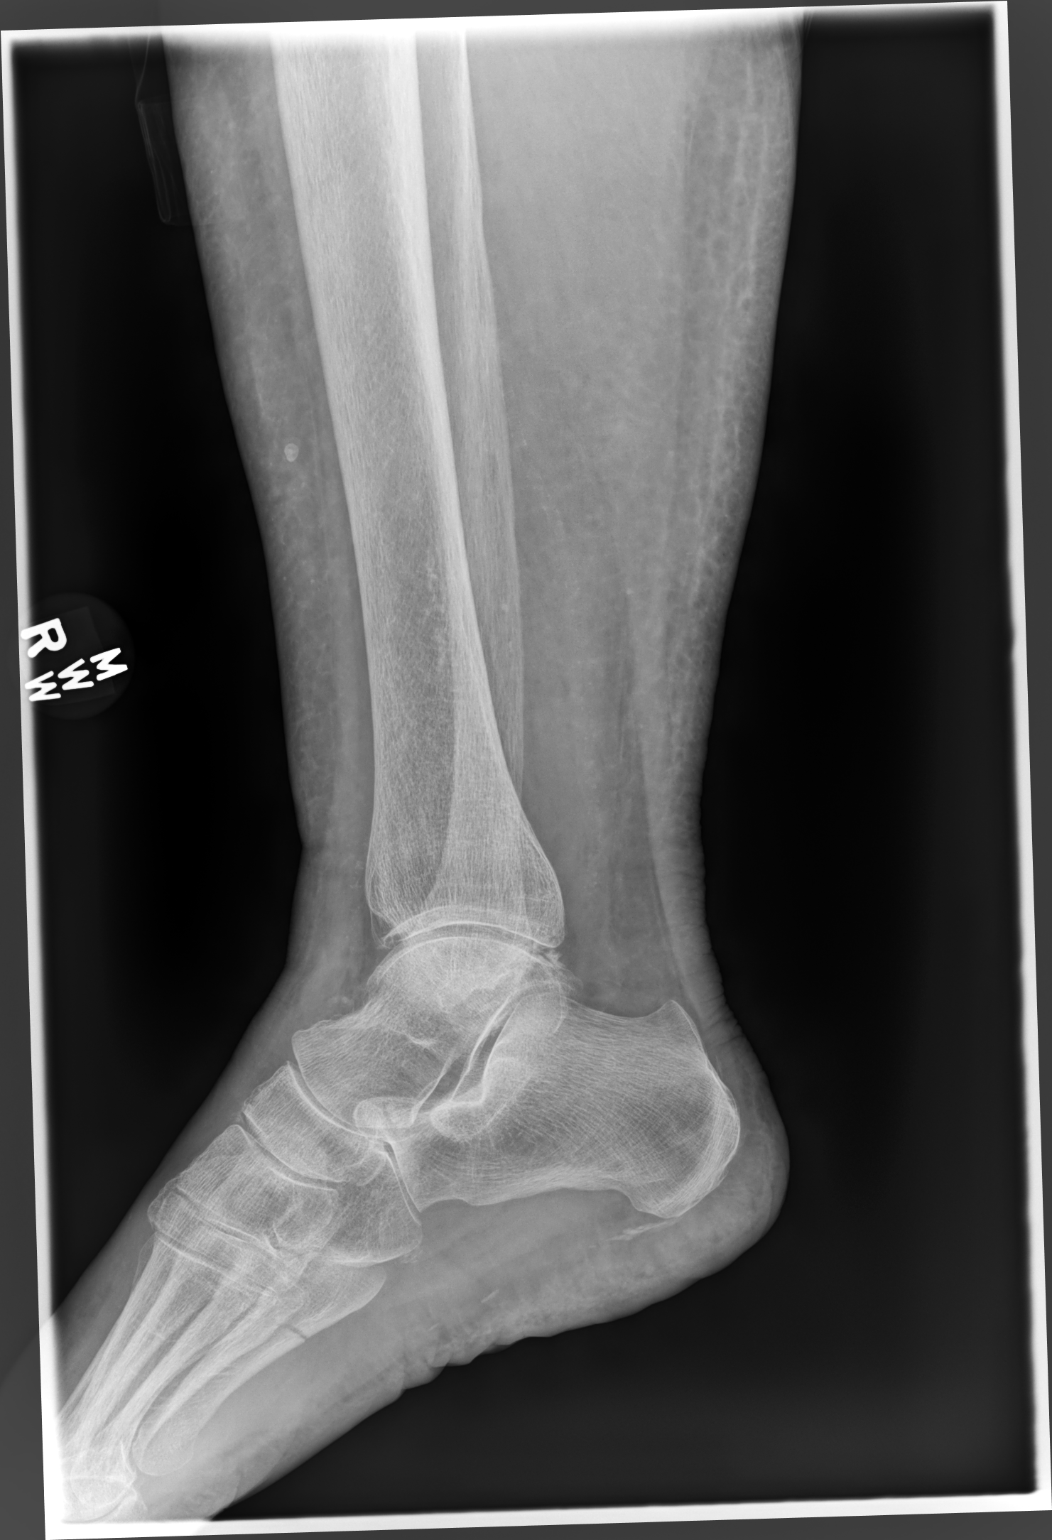

[3 of 3 positions shown; findings below may reference images not displayed]

FINDINGS: Partially visualized fracture of the proximal fifth metatarsal. Mild
degenerative changes of the tibiotalar joint. Diffuse soft tissue
edema.
IMPRESSION: Partially visualized fracture of the proximal fifth metatarsal.
Recommend dedicated foot radiograph for further evaluation.

## 2023-02-15 ENCOUNTER — Encounter: Payer: Self-pay | Admitting: Urology

## 2023-02-15 ENCOUNTER — Ambulatory Visit: Payer: Medicare HMO | Admitting: Urology

## 2023-02-15 ENCOUNTER — Ambulatory Visit (HOSPITAL_COMMUNITY)
Admission: RE | Admit: 2023-02-15 | Discharge: 2023-02-15 | Disposition: A | Discharge status: Home / Self Care | Payer: Medicare HMO | Source: Ambulatory Visit | Attending: Urology | Admitting: Urology

## 2023-02-15 VITALS — BP 119/72 | HR 92

## 2023-02-15 DIAGNOSIS — Z09 Encounter for follow-up examination after completed treatment for conditions other than malignant neoplasm: Secondary | ICD-10-CM

## 2023-02-15 DIAGNOSIS — Z87442 Personal history of urinary calculi: Secondary | ICD-10-CM

## 2023-02-15 DIAGNOSIS — I878 Other specified disorders of veins: Secondary | ICD-10-CM | POA: Diagnosis not present

## 2023-02-15 DIAGNOSIS — N23 Unspecified renal colic: Secondary | ICD-10-CM

## 2023-02-15 LAB — MICROSCOPIC EXAMINATION

## 2023-02-15 LAB — URINALYSIS, ROUTINE W REFLEX MICROSCOPIC
Bilirubin, UA: NEGATIVE
Glucose, UA: NEGATIVE
Nitrite, UA: NEGATIVE
Protein,UA: NEGATIVE
Specific Gravity, UA: 1.025 (ref 1.005–1.030)
Urobilinogen, Ur: 1 mg/dL (ref 0.2–1.0)
pH, UA: 6 (ref 5.0–7.5)

## 2023-02-15 NOTE — Patient Instructions (Signed)

## 2023-02-15 NOTE — Progress Notes (Unsigned)
02/15/2023 11:57 AM   Sara Fischer 12/05/1940 [id number]  Referring provider: Renne Crigler, PA-C [address] [place],  [place] [address]  nephrolithiasis   HPI: Sara Fischer is a 82yo here for evaluation of nephrolithiasis. She was diagnosed with a 1mm right UVJ calculus 11/1. She passed the calculus and her pain has resolved. KUb from today shows no calculi. This is her second stone event. She drinks 80oz of water daily.    PMH: Past Medical History:  Diagnosis Date   High cholesterol    Hypertension    IDA (iron deficiency anemia)    Iron deficiency anemia due to chronic blood loss 01/16/2023    Surgical History: Past Surgical History:  Procedure Laterality Date   ABDOMINAL HYSTERECTOMY     APPENDECTOMY     BIOPSY  12/26/2022   Procedure: BIOPSY;  Surgeon: Corbin Ade, MD;  [facility];  Service: Endoscopy;;   COLONOSCOPY  2010   Dr. Jena Gauss: pancolonic diverticulosis, sigmoid colon tubular adenoma removed.    COLONOSCOPY N/A 02/29/2016   Procedure: COLONOSCOPY;  Surgeon: Corbin Ade, MD;  [facility];  Service: Endoscopy;  Laterality: N/A;  230   COLONOSCOPY WITH PROPOFOL N/A 12/26/2022   Procedure: COLONOSCOPY WITH PROPOFOL;  Surgeon: Corbin Ade, MD;  [facility];  Service: Endoscopy;  Laterality: N/A;  1230pm, asa 3   ESOPHAGOGASTRODUODENOSCOPY (EGD) WITH PROPOFOL N/A 12/26/2022   Procedure: ESOPHAGOGASTRODUODENOSCOPY (EGD) WITH PROPOFOL;  Surgeon: Corbin Ade, MD;  [facility];  Service: Endoscopy;  Laterality: N/A;   INCISIONAL HERNIA REPAIR N/A 12/28/2016   Procedure: HERNIA REPAIR INCISIONAL;  Surgeon: Franky Macho, MD;  Location: [facility];  Service: General;  Laterality: N/A;   KNEE ARTHROSCOPY     bilateral   LAPAROSCOPIC BILATERAL SALPINGO OOPHERECTOMY  2001   PARTIAL HYSTERECTOMY  1979    Home Medications:  Allergies as of 02/15/2023       Reactions   Feldene [piroxicam]     Headaches    Lodine [etodolac]    headaches        Medication List        Accurate as of February 15, 2023 11:57 AM. If you have any questions, ask your nurse or doctor.          STOP taking these medications    oxyCODONE 5 MG immediate release tablet Commonly known as: Oxy IR/ROXICODONE   pantoprazole 40 MG tablet Commonly known as: PROTONIX       TAKE these medications    aspirin EC 81 MG tablet Take 81 mg by mouth daily.   cephALEXin 500 MG capsule Commonly known as: KEFLEX Take 1 capsule (500 mg total) by mouth 4 (four) times daily.   D3-1000 PO Take 1 capsule by mouth daily.   fluticasone 50 MCG/ACT nasal spray Commonly known as: FLONASE Place 1 spray into both nostrils daily as needed for allergies or rhinitis.   furosemide 40 MG tablet Commonly known as: LASIX   ketoconazole 2 % cream Commonly known as: NIZORAL Apply 1 application topically 2 (two) times daily as needed.   metoprolol succinate 100 MG 24 hr tablet Commonly known as: TOPROL-XL Take 100 mg by mouth daily.   potassium chloride SA 20 MEQ tablet Commonly known as: KLOR-CON M Take 1 tablet (20 mEq total) by mouth 2 (two) times daily.   Propylene Glycol 0.6 % Soln Place 1 drop into both eyes 2 (two) times daily as needed (  dry eyes).   simvastatin 20 MG tablet Commonly known as: ZOCOR Take 20 mg by mouth at bedtime.   Vitamin B 12 250 MCG Lozg Take by mouth.   VITAMIN K2 PO Take 1 tablet by mouth daily.        Allergies:  Allergies  Allergen Reactions   Feldene [Piroxicam]     Headaches    Lodine [Etodolac]     headaches    Family History: Family History  Problem Relation Age of Onset   Diabetes Mother    Heart disease Mother    Heart disease Father 22   Dementia Sister    Heart disease Brother    Diabetes Sister    Heart disease Sister    Heart disease Sister    Diabetes Sister    Arthritis Sister    Heart disease Sister    Heart disease Brother     Healthy Son    Healthy Son    Colon cancer Neg Hx     Social History:  reports that she quit smoking about 54 years ago. Her smoking use included cigarettes. She started smoking about 59 years ago. She has a 2.6 pack-year smoking history. She has never used smokeless tobacco. She reports that she does not currently use alcohol. She reports that she does not use drugs.  ROS: All other review of systems were reviewed and are negative except what is noted above in HPI  Physical Exam: BP 119/72   Pulse 92   Constitutional:  Alert and oriented, No acute distress. HEENT: [place] AT, moist mucus membranes.  Trachea midline, no masses. Cardiovascular: No clubbing, cyanosis, or edema. Respiratory: Normal respiratory effort, no increased work of breathing. GI: Abdomen is soft, nontender, nondistended, no abdominal masses GU: No CVA tenderness.  Lymph: No cervical or inguinal lymphadenopathy. Skin: No rashes, bruises or suspicious lesions. Neurologic: Grossly intact, no focal deficits, moving all 4 extremities. Psychiatric: Normal mood and affect.  Laboratory Data: Lab Results  Component Value Date   WBC 5.1 01/16/2023   HGB 11.4 (L) 01/16/2023   HCT 36.4 01/16/2023   MCV 97.6 01/16/2023   PLT 238 01/16/2023    Lab Results  Component Value Date   CREATININE 0.94 01/11/2023    No results found for: "PSA"  No results found for: "TESTOSTERONE"  No results found for: "HGBA1C"  Urinalysis    Component Value Date/Time   COLORURINE YELLOW 01/11/2023 0913   APPEARANCEUR HAZY (A) 01/11/2023 0913   LABSPEC 1.016 01/11/2023 0913   PHURINE 7.0 01/11/2023 0913   GLUCOSEU NEGATIVE 01/11/2023 0913   HGBUR LARGE (A) 01/11/2023 0913   BILIRUBINUR NEGATIVE 01/11/2023 0913   KETONESUR NEGATIVE 01/11/2023 0913   PROTEINUR 100 (A) 01/11/2023 0913   NITRITE NEGATIVE 01/11/2023 0913   LEUKOCYTESUR MODERATE (A) 01/11/2023 0913    Lab Results  Component Value Date   BACTERIA RARE (A) 01/11/2023     Pertinent Imaging: KUB today: IMages reviewed and discussed with the patient  No results found for this or any previous visit.  No results found for this or any previous visit.  No results found for this or any previous visit.  No results found for this or any previous visit.  No results found for this or any previous visit.  No valid procedures specified. No results found for this or any previous visit.  Results for orders placed during the hospital encounter of 01/11/23  CT Renal Stone Study  Narrative CLINICAL DATA:  Right-sided flank pain  EXAM: CT ABDOMEN AND PELVIS WITHOUT CONTRAST  TECHNIQUE: Multidetector CT imaging of the abdomen and pelvis was performed following the standard protocol without IV contrast.  RADIATION DOSE REDUCTION: This exam was performed according to the departmental dose-optimization program which includes automated exposure control, adjustment of the mA and/or kV according to patient size and/or use of iterative reconstruction technique.  COMPARISON:  Contrast CT 07/13/2020.  FINDINGS: Lower chest: There is slight linear opacity lung bases likely scar or atelectasis. No pleural effusion. Moderate hiatal hernia.  Hepatobiliary: No focal liver abnormality is seen. No gallstones, gallbladder wall thickening, or biliary dilatation.  Pancreas: Unremarkable. No pancreatic ductal dilatation or surrounding inflammatory changes.  Spleen: Normal in size without focal abnormality.  Adrenals/Urinary Tract: Adrenal glands are preserved. No abnormal calcification seen in the left kidney nor along the course of the left ureter. No right-sided renal collecting system dilatation. Exophytic from the medial aspect of the left kidney is a 15 mm low-attenuation structure with Hounsfield units of 10 consistent with a Bosniak 1 cyst. No specific imaging follow-up.  There is moderate perinephric stranding on the right with mild collecting system  dilatation. An ectatic ureter seen down to the UVJ where there is punctate stone. Separate punctate lower pole stone in the right kidney best seen on coronal imaging. Preserved contours of the urinary bladder.  Stomach/Bowel: On this non oral contrast exam, the large bowel has a normal course and caliber with scattered stool left-sided colonic diverticulosis. Small bowel is nondilated. No free air or free fluid. Once again there is a moderate hiatal hernia.  Vascular/Lymphatic: Diffuse vascular calcifications identified including along the branch vessels. Potential areas of significant stenosis are suggested. Please correlate with any particular symptoms. Normal caliber aorta. No specific abnormal lymph node enlargement identified in the abdomen and pelvis.  Reproductive: Status post hysterectomy. No adnexal masses.  Other: Previous anterior pelvic wall hernia repair. Small umbilical hernia. No free air. No significant free fluid.  Musculoskeletal: Osteopenia. Scattered degenerative changes of the spine. 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  IMPRESSION: Right-sided renal collecting system dilatation with perinephric stranding and a punctate right UVJ stone.  Colonic diverticulosis.  Hiatal hernia.   Electronically Signed By: Karen Kays M.D. On: 01/11/2023 11:54   Assessment & Plan:    1. Nephrolithiasis Followup 6 months with a renal US - DG Abd 1 View - Urinalysis, Routine w reflex microscopic   No follow-ups on file.  Wilkie Aye, MD  [facility] [place]

## 2023-02-18 DIAGNOSIS — Z85820 Personal history of malignant melanoma of skin: Secondary | ICD-10-CM | POA: Diagnosis not present

## 2023-02-18 DIAGNOSIS — Z85828 Personal history of other malignant neoplasm of skin: Secondary | ICD-10-CM | POA: Diagnosis not present

## 2023-02-18 DIAGNOSIS — L218 Other seborrheic dermatitis: Secondary | ICD-10-CM | POA: Diagnosis not present

## 2023-02-18 DIAGNOSIS — L570 Actinic keratosis: Secondary | ICD-10-CM | POA: Diagnosis not present

## 2023-02-25 DIAGNOSIS — D518 Other vitamin B12 deficiency anemias: Secondary | ICD-10-CM | POA: Diagnosis not present

## 2023-03-21 ENCOUNTER — Other Ambulatory Visit (HOSPITAL_COMMUNITY): Payer: Self-pay | Admitting: Internal Medicine

## 2023-03-21 DIAGNOSIS — Z1231 Encounter for screening mammogram for malignant neoplasm of breast: Secondary | ICD-10-CM

## 2023-03-22 NOTE — Progress Notes (Deleted)
 Referring Provider: Bertell Satterfield, MD Primary Care Physician:  Bertell Satterfield, MD Primary GI Physician: Dr. PIERRETTE Johns chief complaint on file.   HPI:   Sara Fischer is a 83 y.o. female presenting today for follow-up of IDA and constipation.  Patient has chronic normocytic anemia with baseline hemoglobin in the 10-11 range.  She is found to have low iron  saturation in February 2021 and started to receive Venofer  intermittently per hematology.  Hemoglobin declined to 8.4. on 11/08/2022 on labs completed with PCP.  Stool cards were positive x 3 in September 2024 with hematology.  Hematology referred to GI for further evaluation of iron  deficiency anemia.  I saw her in the office 11/22/2022.  She denied overt GI bleeding.  Reported chronic constipation skipping couple of days between bowel movements.  She was taking 2 stool softeners daily.  No other significant GI symptoms.  She is taking 81 mg aspirin, Aleve as needed for headaches.  She was scheduled for EGD and colonoscopy, advised to stop stool softeners and start MiraLAX.  Colonoscopy 12/26/2022 pancolonic diverticulosis, otherwise normal exam.  EGD 12/26/2022: Normal esophagus, large hiatal hernia, antral ulcer with satellite erosions s/p biopsy.  Pathology was benign.  No H. pylori, metaplasia, dysplasia.  Recommended PPI twice daily, avoid NSAIDs aside from 81 mg aspirin, and repeat EGD in 3 months.   Today:    Last labs on file 01/16/2023: Hemoglobin 11.4 (L), normocytic indices, iron  56, iron  saturation 15%, ferritin 121.  Last iron  infusion 01/22/2023.   Past Medical History:  Diagnosis Date   High cholesterol    Hypertension    IDA (iron  deficiency anemia)    Iron  deficiency anemia due to chronic blood loss 01/16/2023    Past Surgical History:  Procedure Laterality Date   ABDOMINAL HYSTERECTOMY     APPENDECTOMY     BIOPSY  12/26/2022   Procedure: BIOPSY;  Surgeon: Shaaron Lamar HERO, MD;  [facility];   Service: Endoscopy;;   COLONOSCOPY  2010   Dr. Shaaron: pancolonic diverticulosis, sigmoid colon tubular adenoma removed.    COLONOSCOPY N/A 02/29/2016   Procedure: COLONOSCOPY;  Surgeon: Lamar HERO Shaaron, MD;  [facility];  Service: Endoscopy;  Laterality: N/A;  230   COLONOSCOPY WITH PROPOFOL  N/A 12/26/2022   Procedure: COLONOSCOPY WITH PROPOFOL ;  Surgeon: Shaaron Lamar HERO, MD;  [facility];  Service: Endoscopy;  Laterality: N/A;  1230pm, asa 3   ESOPHAGOGASTRODUODENOSCOPY (EGD) WITH PROPOFOL  N/A 12/26/2022   Procedure: ESOPHAGOGASTRODUODENOSCOPY (EGD) WITH PROPOFOL ;  Surgeon: Shaaron Lamar HERO, MD;  [facility];  Service: Endoscopy;  Laterality: N/A;   INCISIONAL HERNIA REPAIR N/A 12/28/2016   Procedure: HERNIA REPAIR INCISIONAL;  Surgeon: Mavis Anes, MD;  Location: [facility];  Service: General;  Laterality: N/A;   KNEE ARTHROSCOPY     bilateral   LAPAROSCOPIC BILATERAL SALPINGO OOPHERECTOMY  2001   PARTIAL HYSTERECTOMY  1979    Current Outpatient Medications  Medication Sig Dispense Refill   aspirin EC 81 MG tablet Take 81 mg by mouth daily.     cephALEXin  (KEFLEX ) 500 MG capsule Take 1 capsule (500 mg total) by mouth 4 (four) times daily. 28 capsule 0   Cholecalciferol (D3-1000 PO) Take 1 capsule by mouth daily.     Cyanocobalamin  (VITAMIN B 12) 250 MCG LOZG Take by mouth.     fluticasone  (FLONASE ) 50 MCG/ACT nasal spray Place 1 spray into both nostrils daily as needed for allergies or rhinitis.  furosemide  (LASIX ) 40 MG tablet      ketoconazole (NIZORAL) 2 % cream Apply 1 application topically 2 (two) times daily as needed.      Menaquinone-7 (VITAMIN K2 PO) Take 1 tablet by mouth daily.     metoprolol  succinate (TOPROL -XL) 100 MG 24 hr tablet Take 100 mg by mouth daily.     potassium chloride  SA (KLOR-CON  M) 20 MEQ tablet Take 1 tablet (20 mEq total) by mouth 2 (two) times daily. 10 tablet 0   Propylene Glycol 0.6 % SOLN Place 1 drop into both eyes  2 (two) times daily as needed (dry eyes).     simvastatin  (ZOCOR ) 20 MG tablet Take 20 mg by mouth at bedtime.      No current facility-administered medications for this visit.    Allergies as of 03/25/2023 - Review Complete 02/15/2023  Allergen Reaction Noted   Feldene [piroxicam]  11/02/2013   Lodine [etodolac]  11/08/2010    Family History  Problem Relation Age of Onset   Diabetes Mother    Heart disease Mother    Heart disease Father 41   Dementia Sister    Heart disease Brother    Diabetes Sister    Heart disease Sister    Heart disease Sister    Diabetes Sister    Arthritis Sister    Heart disease Sister    Heart disease Brother    Healthy Son    Healthy Son    Colon cancer Neg Hx     Social History   Socioeconomic History   Marital status: Widowed    Spouse name: Not on file   Number of children: 4   Years of education: Not on file   Highest education level: Not on file  Occupational History   Occupation: Retired  Tobacco Use   Smoking status: Former    Current packs/day: 0.00    Average packs/day: 0.5 packs/day for 5.2 years (2.6 ttl pk-yrs)    Types: Cigarettes    Start date: 08/28/1963    Quit date: 11/02/1968    Years since quitting: 54.4   Smokeless tobacco: Never  Vaping Use   Vaping status: Never Used  Substance and Sexual Activity   Alcohol use: Not Currently    Comment: wine occas.   Drug use: No   Sexual activity: Not on file  Other Topics Concern   Not on file  Social History Narrative   Right handed    Caffeine use: 1 cup coffee every morning   1 soda per day   Social Drivers of Health   Financial Resource Strain: Not on file  Food Insecurity: Not on file  Transportation Needs: Not on file  Physical Activity: Not on file  Stress: Not on file  Social Connections: Not on file    Review of Systems: Gen: Denies fever, chills, anorexia. Denies fatigue, weakness, weight loss.  CV: Denies chest pain, palpitations, syncope,  peripheral edema, and claudication. Resp: Denies dyspnea at rest, cough, wheezing, coughing up blood, and pleurisy. GI: Denies vomiting blood, jaundice, and fecal incontinence.   Denies dysphagia or odynophagia. Derm: Denies rash, itching, dry skin Psych: Denies depression, anxiety, memory loss, confusion. No homicidal or suicidal ideation.  Heme: Denies bruising, bleeding, and enlarged lymph nodes.  Physical Exam: There were no vitals taken for this visit. General:   Alert and oriented. No distress noted. Pleasant and cooperative.  Head:  Normocephalic and atraumatic. Eyes:  Conjuctiva clear without scleral icterus. Heart:  S1, S2 present  without murmurs appreciated. Lungs:  Clear to auscultation bilaterally. No wheezes, rales, or rhonchi. No distress.  Abdomen:  +BS, soft, non-tender and non-distended. No rebound or guarding. No HSM or masses noted. Msk:  Symmetrical without gross deformities. Normal posture. Extremities:  Without edema. Neurologic:  Alert and  oriented x4 Psych:  Normal mood and affect.    Assessment:     Plan:  ***   Josette Centers, PA-C [facility] 03/25/2023

## 2023-03-25 ENCOUNTER — Ambulatory Visit: Payer: Medicare HMO | Admitting: Gastroenterology

## 2023-03-27 DIAGNOSIS — D518 Other vitamin B12 deficiency anemias: Secondary | ICD-10-CM | POA: Diagnosis not present

## 2023-04-01 NOTE — Progress Notes (Unsigned)
Referring Provider: Elfredia Nevins, MD Primary Care Physician:  Elfredia Nevins, MD Primary GI Physician: Dr. Jena Gauss  No chief complaint on file.   HPI:   Sara Fischer is a 83 y.o. female presenting today  for follow-up of IDA and constipation.   Patient has chronic normocytic anemia with baseline hemoglobin in the 10-11 range.  She is found to have low iron saturation in February 2021 and started to receive Venofer intermittently per hematology.  Hemoglobin declined to 8.4. on 11/08/2022 on labs completed with PCP.  Stool cards were positive x 3 in September 2024 with hematology.  Hematology referred to GI for further evaluation of iron deficiency anemia.   I saw her in the office 11/22/2022.  She denied overt GI bleeding.  Reported chronic constipation skipping couple of days between bowel movements.  She was taking 2 stool softeners daily.  No other significant GI symptoms.  She is taking 81 mg aspirin, Aleve as needed for headaches.  She was scheduled for EGD and colonoscopy, advised to stop stool softeners and start MiraLAX.   Colonoscopy 12/26/2022 pancolonic diverticulosis, otherwise normal exam.   EGD 12/26/2022: Normal esophagus, large hiatal hernia, antral ulcer with satellite erosions s/p biopsy.  Pathology was benign.  No H. pylori, metaplasia, dysplasia.  Recommended PPI twice daily, avoid NSAIDs aside from 81 mg aspirin, and repeat EGD in 3 months.     Today:       Last labs on file 01/16/2023: Hemoglobin 11.4 (L), normocytic indices, iron 56, iron saturation 15%, ferritin 121.   Last iron infusion 01/22/2023.     Past Medical History:  Diagnosis Date   High cholesterol    Hypertension    IDA (iron deficiency anemia)    Iron deficiency anemia due to chronic blood loss 01/16/2023    Past Surgical History:  Procedure Laterality Date   ABDOMINAL HYSTERECTOMY     APPENDECTOMY     BIOPSY  12/26/2022   Procedure: BIOPSY;  Surgeon: Corbin Ade, MD;  [facility];  Service: Endoscopy;;   COLONOSCOPY  2010   Dr. Jena Gauss: pancolonic diverticulosis, sigmoid colon tubular adenoma removed.    COLONOSCOPY N/A 02/29/2016   Procedure: COLONOSCOPY;  Surgeon: Corbin Ade, MD;  [facility];  Service: Endoscopy;  Laterality: N/A;  230   COLONOSCOPY WITH PROPOFOL N/A 12/26/2022   Procedure: COLONOSCOPY WITH PROPOFOL;  Surgeon: Corbin Ade, MD;  [facility];  Service: Endoscopy;  Laterality: N/A;  1230pm, asa 3   ESOPHAGOGASTRODUODENOSCOPY (EGD) WITH PROPOFOL N/A 12/26/2022   Procedure: ESOPHAGOGASTRODUODENOSCOPY (EGD) WITH PROPOFOL;  Surgeon: Corbin Ade, MD;  [facility];  Service: Endoscopy;  Laterality: N/A;   INCISIONAL HERNIA REPAIR N/A 12/28/2016   Procedure: HERNIA REPAIR INCISIONAL;  Surgeon: Franky Macho, MD;  [facility];  Service: General;  Laterality: N/A;   KNEE ARTHROSCOPY     bilateral   LAPAROSCOPIC BILATERAL SALPINGO OOPHERECTOMY  2001   PARTIAL HYSTERECTOMY  1979    Current Outpatient Medications  Medication Sig Dispense Refill   aspirin EC 81 MG tablet Take 81 mg by mouth daily.     cephALEXin (KEFLEX) 500 MG capsule Take 1 capsule (500 mg total) by mouth 4 (four) times daily. 28 capsule 0   Cholecalciferol (D3-1000 PO) Take 1 capsule by mouth daily.     Cyanocobalamin (VITAMIN B 12) 250 MCG LOZG Take by mouth.     fluticasone (FLONASE) 50 MCG/ACT nasal spray Place  1 spray into both nostrils daily as needed for allergies or rhinitis.     furosemide (LASIX) 40 MG tablet      ketoconazole (NIZORAL) 2 % cream Apply 1 application topically 2 (two) times daily as needed.      Menaquinone-7 (VITAMIN K2 PO) Take 1 tablet by mouth daily.     metoprolol succinate (TOPROL-XL) 100 MG 24 hr tablet Take 100 mg by mouth daily.     potassium chloride SA (KLOR-CON M) 20 MEQ tablet Take 1 tablet (20 mEq total) by mouth 2 (two) times daily. 10 tablet 0   Propylene Glycol 0.6 % SOLN Place 1 drop  into both eyes 2 (two) times daily as needed (dry eyes).     simvastatin (ZOCOR) 20 MG tablet Take 20 mg by mouth at bedtime.      No current facility-administered medications for this visit.    Allergies as of 04/03/2023 - Review Complete 02/15/2023  Allergen Reaction Noted   Feldene [piroxicam]  11/02/2013   Lodine [etodolac]  11/08/2010    Family History  Problem Relation Age of Onset   Diabetes Mother    Heart disease Mother    Heart disease Father 83   Dementia Sister    Heart disease Brother    Diabetes Sister    Heart disease Sister    Heart disease Sister    Diabetes Sister    Arthritis Sister    Heart disease Sister    Heart disease Brother    Healthy Son    Healthy Son    Colon cancer Neg Hx     Social History   Socioeconomic History   Marital status: Widowed    Spouse name: Not on file   Number of children: 4   Years of education: Not on file   Highest education level: Not on file  Occupational History   Occupation: Retired  Tobacco Use   Smoking status: Former    Current packs/day: 0.00    Average packs/day: 0.5 packs/day for 5.2 years (2.6 ttl pk-yrs)    Types: Cigarettes    Start date: 08/28/1963    Quit date: 11/02/1968    Years since quitting: 54.4   Smokeless tobacco: Never  Vaping Use   Vaping status: Never Used  Substance and Sexual Activity   Alcohol use: Not Currently    Comment: wine occas.   Drug use: No   Sexual activity: Not on file  Other Topics Concern   Not on file  Social History Narrative   Right handed    Caffeine use: 1 cup coffee every morning   1 soda per day   Social Drivers of Health   Financial Resource Strain: Not on file  Food Insecurity: Not on file  Transportation Needs: Not on file  Physical Activity: Not on file  Stress: Not on file  Social Connections: Not on file    Review of Systems: Gen: Denies fever, chills, anorexia. Denies fatigue, weakness, weight loss.  CV: Denies chest pain, palpitations,  syncope, peripheral edema, and claudication. Resp: Denies dyspnea at rest, cough, wheezing, coughing up blood, and pleurisy. GI: Denies vomiting blood, jaundice, and fecal incontinence.   Denies dysphagia or odynophagia. Derm: Denies rash, itching, dry skin Psych: Denies depression, anxiety, memory loss, confusion. No homicidal or suicidal ideation.  Heme: Denies bruising, bleeding, and enlarged lymph nodes.  Physical Exam: There were no vitals taken for this visit. General:   Alert and oriented. No distress noted. Pleasant and cooperative.  Head:  Normocephalic and atraumatic. Eyes:  Conjuctiva clear without scleral icterus. Heart:  S1, S2 present without murmurs appreciated. Lungs:  Clear to auscultation bilaterally. No wheezes, rales, or rhonchi. No distress.  Abdomen:  +BS, soft, non-tender and non-distended. No rebound or guarding. No HSM or masses noted. Msk:  Symmetrical without gross deformities. Normal posture. Extremities:  Without edema. Neurologic:  Alert and  oriented x4 Psych:  Normal mood and affect.    Assessment:     Plan:  ***   Ermalinda Memos, PA-C [facility] 04/03/2023

## 2023-04-03 ENCOUNTER — Encounter: Payer: Self-pay | Admitting: Gastroenterology

## 2023-04-03 ENCOUNTER — Ambulatory Visit (INDEPENDENT_AMBULATORY_CARE_PROVIDER_SITE_OTHER): Payer: Medicare HMO | Admitting: Gastroenterology

## 2023-04-03 ENCOUNTER — Telehealth: Payer: Self-pay | Admitting: *Deleted

## 2023-04-03 VITALS — BP 136/69 | HR 105 | Temp 97.6°F | Ht 62.0 in | Wt 143.8 lb

## 2023-04-03 DIAGNOSIS — K279 Peptic ulcer, site unspecified, unspecified as acute or chronic, without hemorrhage or perforation: Secondary | ICD-10-CM | POA: Diagnosis not present

## 2023-04-03 DIAGNOSIS — K5900 Constipation, unspecified: Secondary | ICD-10-CM

## 2023-04-03 DIAGNOSIS — D509 Iron deficiency anemia, unspecified: Secondary | ICD-10-CM

## 2023-04-03 DIAGNOSIS — Z8719 Personal history of other diseases of the digestive system: Secondary | ICD-10-CM

## 2023-04-03 NOTE — Telephone Encounter (Signed)
LMOVM to return call  EGD w/Dr.Rourk, asa 2 (wants March)

## 2023-04-03 NOTE — Patient Instructions (Signed)
We will get you scheduled for a repeat upper endoscopy in March as you requested to hold off until then.  For now, continue pantoprazole 40 mg twice a day.  Continue to avoid all NSAID products aside from your 81 mg aspirin.  Continue MiraLAX a couple times a week as this is working well for you.  Continue following with hematology for monitoring of your hemoglobin and iron levels.  We will see you back after your upper endoscopy.  Ermalinda Memos, PA-C [facility]

## 2023-04-05 ENCOUNTER — Other Ambulatory Visit: Payer: Self-pay | Admitting: *Deleted

## 2023-04-05 ENCOUNTER — Encounter: Payer: Self-pay | Admitting: *Deleted

## 2023-04-05 DIAGNOSIS — K279 Peptic ulcer, site unspecified, unspecified as acute or chronic, without hemorrhage or perforation: Secondary | ICD-10-CM

## 2023-04-05 NOTE — Telephone Encounter (Signed)
Pt has been scheduled for 05/16/23. Instructions mailed. Pt informed needed to have lab work done prior to procedure. She states she will be going on 04/30/23 to have labs done and will have it done on that day.

## 2023-04-05 NOTE — Telephone Encounter (Signed)
[employer] PA: Approved Authorization #[id number]  Tracking #[id number] Dates of service 05/16/2023 - 08/15/2023

## 2023-04-22 ENCOUNTER — Encounter (HOSPITAL_COMMUNITY): Payer: Self-pay

## 2023-04-22 ENCOUNTER — Ambulatory Visit (HOSPITAL_COMMUNITY)
Admission: RE | Admit: 2023-04-22 | Discharge: 2023-04-22 | Disposition: A | Discharge status: Home / Self Care | Payer: Medicare HMO | Source: Ambulatory Visit | Attending: Internal Medicine | Admitting: Internal Medicine

## 2023-04-22 DIAGNOSIS — Z1231 Encounter for screening mammogram for malignant neoplasm of breast: Secondary | ICD-10-CM | POA: Insufficient documentation

## 2023-04-22 NOTE — Progress Notes (Addendum)
[facility] [address], [place] [address]   CLINIC:  Medical Oncology/Hematology  PCP:  Elfredia Nevins, MD [address] [place] [address] [phone]   REASON FOR VISIT:  Follow-up for iron deficiency anemia   CURRENT THERAPY: Oral iron tablets and intermittent IV iron   INTERVAL HISTORY:   Sara Fischer 83 y.o. female returns for routine follow-up of her iron deficiency anemia.  She was last seen by Rojelio Brenner PA-C on 01/16/2023.     At today's visit, she reports feeling well.   She reports feeling well after Venofer 400 mg x 1 on 01/22/2023.   She denies any fatigue, pica, restless legs, headaches, lightheadedness, syncope.  She has not had any chest pain or dyspnea on exertion.  She denies any frank hematochezia or melena. She takes B12 tablet daily and is also getting B12 shots monthly from Dr. Sherwood Gambler.  She stopped taking iron due to side effects.  She takes aspirin 81 mg daily.   She continues to remain active and goes dancing at least 4 nights per week, and also enjoys spending time with her great-grandchildren.  She has 85% energy and 100% appetite. She endorses that she is maintaining a stable weight.  ASSESSMENT & PLAN:  1.  Normocytic anemia with iron deficiency - Referred by primary care provider (Dr. Sherwood Gambler) in March 2022 - CBC on 04/28/2019 at Dr. Sharyon Medicus office showed hemoglobin 9.5, MCV of 89. White count was slightly low at 3.4 and platelets were 229. Differential was normal.  Ferritin was 15.  Normal B12 and folic acid. Percent saturation was 9.0, reticulocyte was 2% - Hemoccult POSITIVE 3/3 in September 2024 - EGD (12/26/2022): Normal esophagus, large hiatal hernia, antral ulcer with satellite erosions - Colonoscopy (12/26/2022): Diverticulosis in entire colon, otherwise normal exam - SPEP negative, LDH normal, stool occult blood normal.  Normal creatinine without evidence of kidney dysfunction. - She was unable to tolerate  oral iron due to constipation. - Vitamin B12 is being managed by her PCP, currently taking daily supplement and receiving monthly B12 injections. - Most recent IV iron with Venofer 400 mg x 1 on 01/22/2023 - Most recent labs (04/23/2023): Hgb 12.4, ferritin 238, iron saturation 33% - No bright red blood per rectum or melena. - She is scheduled for upcoming EGD on 05/16/2023  - PLAN: No indication for IV iron at this time. - Labs (CBC/D, ferritin, iron/TIBC) and RTC in 4 months  PLAN SUMMARY:  >> Same-day labs (CBC/D, ferritin, iron/TIBC) + OFFICE visit in 4 months    REVIEW OF SYSTEMS:   Review of Systems  Constitutional:  Negative for appetite change, chills, diaphoresis, fatigue, fever and unexpected weight change.  HENT:   Negative for lump/mass and nosebleeds.   Eyes:  Negative for eye problems.  Respiratory:  Negative for cough, hemoptysis and shortness of breath.   Cardiovascular:  Negative for chest pain, leg swelling and palpitations.  Gastrointestinal:  Negative for abdominal pain, blood in stool, constipation, diarrhea, nausea and vomiting.  Genitourinary:  Negative for difficulty urinating and hematuria.   Skin: Negative.   Neurological:  Positive for numbness (fingertips). Negative for dizziness, headaches and light-headedness.  Hematological:  Does not bruise/bleed easily.     PHYSICAL EXAM:  ECOG PERFORMANCE STATUS: 0 - Asymptomatic  Vitals:   04/23/23 1009  BP: 137/77  Pulse: 71  Resp: 18  Temp: 97.9 F (36.6 C)  SpO2: 99%    Filed Weights   04/23/23 1009  Weight: 146 lb (66.2 kg)    Physical Exam Constitutional:      Appearance: Normal appearance. She is normal weight.  Cardiovascular:     Heart sounds: Normal heart sounds.  Pulmonary:     Breath sounds: Normal breath sounds.  Neurological:     General: No focal deficit present.     Mental Status: Mental status is at baseline.  Psychiatric:        Behavior: Behavior normal. Behavior is cooperative.     PAST MEDICAL/SURGICAL HISTORY:  Past Medical History:  Diagnosis Date   High cholesterol    Hypertension    IDA (iron deficiency anemia)    Iron deficiency anemia due to chronic blood loss 01/16/2023   PUD (peptic ulcer disease) 12/2022   Past Surgical History:  Procedure Laterality Date   ABDOMINAL HYSTERECTOMY     APPENDECTOMY     BIOPSY  12/26/2022   Procedure: BIOPSY;  Surgeon: Corbin Ade, MD;  Location: [facility];  Service: Endoscopy;;   COLONOSCOPY  2010   Dr. Jena Gauss: pancolonic diverticulosis, sigmoid colon tubular adenoma removed.    COLONOSCOPY N/A 02/29/2016   Procedure: COLONOSCOPY;  Surgeon: Corbin Ade, MD;  Location: [facility];  Service: Endoscopy;  Laterality: N/A;  230   COLONOSCOPY WITH PROPOFOL N/A 12/26/2022   Surgeon: Corbin Ade, MD;   pancolonic diverticulosis, otherwise normal exam.   ESOPHAGOGASTRODUODENOSCOPY (EGD) WITH PROPOFOL N/A 12/26/2022   Surgeon: Corbin Ade, MD;  Normal esophagus, large hiatal hernia, antral ulcer with satellite erosions s/p biopsy.  Pathology was benign.  No H. pylori, metaplasia, dysplasia.   INCISIONAL HERNIA REPAIR N/A 12/28/2016   Procedure: HERNIA REPAIR INCISIONAL;  Surgeon: Franky Macho, MD;  Location: [facility];  Service: General;  Laterality: N/A;   KNEE ARTHROSCOPY     bilateral   LAPAROSCOPIC BILATERAL SALPINGO OOPHERECTOMY  2001   PARTIAL HYSTERECTOMY  1979    SOCIAL HISTORY:  Social History   Socioeconomic History   Marital status: Widowed    Spouse name: Not on file   Number of children: 4   Years of education: Not on file   Highest education level: Not on file  Occupational History   Occupation: Retired  Tobacco Use   Smoking status: Former    Current packs/day: 0.00    Average packs/day: 0.5 packs/day for 5.2 years (2.6 ttl pk-yrs)    Types: Cigarettes    Start date: 08/28/1963    Quit date: 11/02/1968    Years since quitting: 54.5   Smokeless tobacco: Never  Vaping Use    Vaping status: Never Used  Substance and Sexual Activity   Alcohol use: Not Currently    Comment: wine occas.   Drug use: No   Sexual activity: Not on file  Other Topics Concern   Not on file  Social History Narrative   Right handed    Caffeine use: 1 cup coffee every morning   1 soda per day   Social Drivers of Health   Financial Resource Strain: Not on file  Food Insecurity: Not on file  Transportation Needs: Not on file  Physical Activity: Not on file  Stress: Not on file  Social Connections: Not on file  Intimate Partner Violence: Not on file    FAMILY HISTORY:  Family History  Problem Relation Age of Onset   Diabetes Mother    Heart disease Mother    Heart disease Father 68   Dementia Sister    Heart disease  Brother    Diabetes Sister    Heart disease Sister    Heart disease Sister    Diabetes Sister    Arthritis Sister    Heart disease Sister    Heart disease Brother    Healthy Son    Healthy Son    Colon cancer Neg Hx     CURRENT MEDICATIONS:  Outpatient Encounter Medications as of 04/23/2023  Medication Sig   aspirin EC 81 MG tablet Take 81 mg by mouth daily.   Cholecalciferol (D3-1000 PO) Take 1 capsule by mouth daily.   Cyanocobalamin (VITAMIN B 12) 250 MCG LOZG Take by mouth.   fluticasone (FLONASE) 50 MCG/ACT nasal spray Place 1 spray into both nostrils daily as needed for allergies or rhinitis.   furosemide (LASIX) 40 MG tablet    ketoconazole (NIZORAL) 2 % cream Apply 1 application topically 2 (two) times daily as needed.    Menaquinone-7 (VITAMIN K2 PO) Take 1 tablet by mouth daily.   metoprolol succinate (TOPROL-XL) 100 MG 24 hr tablet Take 100 mg by mouth daily.   pantoprazole (PROTONIX) 40 MG tablet Take 40 mg by mouth 2 (two) times daily.   potassium chloride SA (KLOR-CON M) 20 MEQ tablet Take 1 tablet (20 mEq total) by mouth 2 (two) times daily.   Propylene Glycol 0.6 % SOLN Place 1 drop into both eyes 2 (two) times daily as needed (dry  eyes).   simvastatin (ZOCOR) 20 MG tablet Take 20 mg by mouth at bedtime.    No facility-administered encounter medications on file as of 04/23/2023.    ALLERGIES:  Allergies  Allergen Reactions   Feldene [Piroxicam]     Headaches    Lodine [Etodolac]     headaches    LABORATORY DATA:  I have reviewed the labs as listed.  CBC    Component Value Date/Time   WBC 6.6 04/23/2023 0913   RBC 3.91 04/23/2023 0913   HGB 12.4 04/23/2023 0913   HCT 37.4 04/23/2023 0913   PLT 215 04/23/2023 0913   MCV 95.7 04/23/2023 0913   MCH 31.7 04/23/2023 0913   MCHC 33.2 04/23/2023 0913   RDW 13.6 04/23/2023 0913   LYMPHSABS 2.6 04/23/2023 0913   MONOABS 0.8 04/23/2023 0913   EOSABS 0.2 04/23/2023 0913   BASOSABS 0.0 04/23/2023 0913      Latest Ref Rng & Units 01/11/2023    9:43 AM 12/24/2022   10:33 AM 10/26/2022   10:46 AM  CMP  Glucose 70 - 99 mg/dL 161  98  096   BUN 8 - 23 mg/dL 16  17  16    Creatinine 0.44 - 1.00 mg/dL 0.45  4.09  8.11   Sodium 135 - 145 mmol/L 141  139  139   Potassium 3.5 - 5.1 mmol/L 2.8  3.3  3.5   Chloride 98 - 111 mmol/L 98  104  101   CO2 22 - 32 mmol/L 30  27  29    Calcium 8.9 - 10.3 mg/dL 9.6  9.2  9.9   Total Protein 6.5 - 8.1 g/dL 6.5   7.0   Total Bilirubin 0.3 - 1.2 mg/dL 0.6   0.7   Alkaline Phos 38 - 126 U/L 62   57   AST 15 - 41 U/L 20   18   ALT 0 - 44 U/L 12   13     DIAGNOSTIC IMAGING:  I have independently reviewed the relevant imaging and discussed with the patient.   WRAP  UP:  All questions were answered. The patient knows to call the clinic with any problems, questions or concerns.  Medical decision making: Low  Time spent on visit: I spent 15 minutes counseling the patient face to face. The total time spent in the appointment was 22 minutes and more than 50% was on counseling.  Carnella Guadalajara, PA-C  04/23/23 10:26 AM

## 2023-04-23 ENCOUNTER — Inpatient Hospital Stay: Payer: Medicare HMO

## 2023-04-23 ENCOUNTER — Inpatient Hospital Stay: Payer: Medicare HMO | Attending: Physician Assistant | Admitting: Physician Assistant

## 2023-04-23 VITALS — BP 137/77 | HR 71 | Temp 97.9°F | Resp 18 | Wt 146.0 lb

## 2023-04-23 DIAGNOSIS — Z87891 Personal history of nicotine dependence: Secondary | ICD-10-CM | POA: Diagnosis not present

## 2023-04-23 DIAGNOSIS — D500 Iron deficiency anemia secondary to blood loss (chronic): Secondary | ICD-10-CM

## 2023-04-23 DIAGNOSIS — D509 Iron deficiency anemia, unspecified: Secondary | ICD-10-CM | POA: Insufficient documentation

## 2023-04-23 LAB — CBC WITH DIFFERENTIAL/PLATELET
Abs Immature Granulocytes: 0.03 10*3/uL (ref 0.00–0.07)
Basophils Absolute: 0 10*3/uL (ref 0.0–0.1)
Basophils Relative: 1 %
Eosinophils Absolute: 0.2 10*3/uL (ref 0.0–0.5)
Eosinophils Relative: 3 %
HCT: 37.4 % (ref 36.0–46.0)
Hemoglobin: 12.4 g/dL (ref 12.0–15.0)
Immature Granulocytes: 1 %
Lymphocytes Relative: 39 %
Lymphs Abs: 2.6 10*3/uL (ref 0.7–4.0)
MCH: 31.7 pg (ref 26.0–34.0)
MCHC: 33.2 g/dL (ref 30.0–36.0)
MCV: 95.7 fL (ref 80.0–100.0)
Monocytes Absolute: 0.8 10*3/uL (ref 0.1–1.0)
Monocytes Relative: 13 %
Neutro Abs: 2.9 10*3/uL (ref 1.7–7.7)
Neutrophils Relative %: 43 %
Platelets: 215 10*3/uL (ref 150–400)
RBC: 3.91 MIL/uL (ref 3.87–5.11)
RDW: 13.6 % (ref 11.5–15.5)
WBC: 6.6 10*3/uL (ref 4.0–10.5)
nRBC: 0 % (ref 0.0–0.2)

## 2023-04-23 LAB — IRON AND TIBC
Iron: 108 ug/dL (ref 28–170)
Saturation Ratios: 33 % — ABNORMAL HIGH (ref 10.4–31.8)
TIBC: 333 ug/dL (ref 250–450)
UIBC: 225 ug/dL

## 2023-04-23 LAB — FERRITIN: Ferritin: 238 ng/mL (ref 11–307)

## 2023-04-23 NOTE — Patient Instructions (Signed)
[facility] at [facility] **VISIT SUMMARY & IMPORTANT INSTRUCTIONS **   You were seen today by Rojelio Brenner PA-C for your iron deficiency anemia.   Your blood and iron levels look great! You do not need any IV iron. We will see you for follow-up visit in 4 months. Continue follow-up with Dr. Jena Gauss (gastroenterology) [facility], as directed by their office.  ** Thank you for trusting me with your healthcare!  I strive to provide all of my patients with quality care at each visit.  If you receive a survey for this visit, I would be so grateful to you for taking the time to provide feedback.  Thank you in advance!  ~ Yeimi Debnam                   Dr. Doreatha Massed   &   Rojelio Brenner, PA-C   - - - - - - - - - - - - - - - - - -    Thank you for choosing  [facility] Center at [facility] to provide your oncology and hematology care.  To afford each patient quality time with our provider, please arrive at least 15 minutes before your scheduled appointment time.   If you have a lab appointment with the Cancer Center please come in thru the Main Entrance and check in at the main information desk.  You need to re-schedule your appointment should you arrive 10 or more minutes late.  We strive to give you quality time with our providers, and arriving late affects you and other patients whose appointments are after yours.  Also, if you no show three or more times for appointments you may be dismissed from the clinic at the providers discretion.     Again, thank you for choosing [facility].  Our hope is that these requests will decrease the amount of time that you wait before being seen by our physicians.       _____________________________________________________________  Should you have questions after your visit to [facility], please contact our office at [phone] and follow the prompts.  Our office hours are 8:00  a.m. and 4:30 p.m. Monday - Friday.  Please note that voicemails left after 4:00 p.m. may not be returned until the following business day.  We are closed weekends and major holidays.  You do have access to a nurse 24-7, just call the main number to the clinic [phone] and do not press any options, hold on the line and a nurse will answer the phone.    For prescription refill requests, have your pharmacy contact our office and allow 72 hours.

## 2023-04-29 DIAGNOSIS — D518 Other vitamin B12 deficiency anemias: Secondary | ICD-10-CM | POA: Diagnosis not present

## 2023-05-14 ENCOUNTER — Other Ambulatory Visit (HOSPITAL_COMMUNITY)
Admission: RE | Admit: 2023-05-14 | Discharge: 2023-05-14 | Disposition: A | Discharge status: Home / Self Care | Source: Ambulatory Visit | Attending: Internal Medicine | Admitting: Internal Medicine

## 2023-05-14 DIAGNOSIS — K279 Peptic ulcer, site unspecified, unspecified as acute or chronic, without hemorrhage or perforation: Secondary | ICD-10-CM | POA: Diagnosis not present

## 2023-05-14 LAB — BASIC METABOLIC PANEL
Anion gap: 13 (ref 5–15)
BUN: 12 mg/dL (ref 8–23)
CO2: 24 mmol/L (ref 22–32)
Calcium: 9.9 mg/dL (ref 8.9–10.3)
Chloride: 103 mmol/L (ref 98–111)
Creatinine, Ser: 0.81 mg/dL (ref 0.44–1.00)
GFR, Estimated: >60 mL/min (ref >60)
Glucose, Bld: 102 mg/dL — ABNORMAL HIGH (ref 70–99)
Potassium: 3.7 mmol/L (ref 3.5–5.1)
Sodium: 140 mmol/L (ref 135–145)

## 2023-05-16 ENCOUNTER — Other Ambulatory Visit: Payer: Self-pay

## 2023-05-16 ENCOUNTER — Ambulatory Visit (HOSPITAL_BASED_OUTPATIENT_CLINIC_OR_DEPARTMENT_OTHER): Payer: Self-pay | Admitting: Anesthesiology

## 2023-05-16 ENCOUNTER — Encounter (HOSPITAL_COMMUNITY)
Admission: RE | Disposition: A | Discharge status: Home / Self Care | Payer: Self-pay | Source: Home / Self Care | Attending: Internal Medicine

## 2023-05-16 ENCOUNTER — Ambulatory Visit (HOSPITAL_COMMUNITY)
Admission: RE | Admit: 2023-05-16 | Discharge: 2023-05-16 | Disposition: A | Discharge status: Home / Self Care | Payer: Medicare HMO | Attending: Internal Medicine | Admitting: Internal Medicine

## 2023-05-16 ENCOUNTER — Ambulatory Visit (HOSPITAL_COMMUNITY): Payer: Self-pay | Admitting: Anesthesiology

## 2023-05-16 ENCOUNTER — Encounter (HOSPITAL_COMMUNITY): Payer: Self-pay | Admitting: Internal Medicine

## 2023-05-16 DIAGNOSIS — Z9049 Acquired absence of other specified parts of digestive tract: Secondary | ICD-10-CM | POA: Insufficient documentation

## 2023-05-16 DIAGNOSIS — Z09 Encounter for follow-up examination after completed treatment for conditions other than malignant neoplasm: Secondary | ICD-10-CM | POA: Diagnosis not present

## 2023-05-16 DIAGNOSIS — R1310 Dysphagia, unspecified: Secondary | ICD-10-CM | POA: Diagnosis not present

## 2023-05-16 DIAGNOSIS — K289 Gastrojejunal ulcer, unspecified as acute or chronic, without hemorrhage or perforation: Secondary | ICD-10-CM | POA: Diagnosis not present

## 2023-05-16 DIAGNOSIS — Z87891 Personal history of nicotine dependence: Secondary | ICD-10-CM | POA: Diagnosis not present

## 2023-05-16 DIAGNOSIS — I10 Essential (primary) hypertension: Secondary | ICD-10-CM | POA: Diagnosis not present

## 2023-05-16 DIAGNOSIS — K449 Diaphragmatic hernia without obstruction or gangrene: Secondary | ICD-10-CM | POA: Diagnosis not present

## 2023-05-16 DIAGNOSIS — Z8711 Personal history of peptic ulcer disease: Secondary | ICD-10-CM | POA: Insufficient documentation

## 2023-05-16 HISTORY — PX: ESOPHAGOGASTRODUODENOSCOPY (EGD) WITH PROPOFOL: SHX5813

## 2023-05-16 SURGERY — ESOPHAGOGASTRODUODENOSCOPY (EGD) WITH PROPOFOL
Anesthesia: General

## 2023-05-16 MED ORDER — LACTATED RINGERS IV SOLN: INTRAVENOUS | Status: DC

## 2023-05-16 MED ORDER — LIDOCAINE HCL (PF) 2 % IJ SOLN
INTRAMUSCULAR | Status: DC | PRN
  Administered 2023-05-16: 80 mg via INTRADERMAL

## 2023-05-16 MED ORDER — PROPOFOL 10 MG/ML IV BOLUS
INTRAVENOUS | Status: DC | PRN
  Administered 2023-05-16: 100 mg via INTRAVENOUS

## 2023-05-16 MED ORDER — PROPOFOL 500 MG/50ML IV EMUL
INTRAVENOUS | Status: DC | PRN
  Administered 2023-05-16: 150 ug/kg/min via INTRAVENOUS

## 2023-05-16 NOTE — Anesthesia Procedure Notes (Signed)
 Date/Time: 05/16/2023 7:31 AM  Performed by: Julian Reil, CRNAPre-anesthesia Checklist: Patient identified, Emergency Drugs available, Patient being monitored and Suction available Patient Re-evaluated:Patient Re-evaluated prior to induction Oxygen Delivery Method: Nasal cannula Induction Type: IV induction Placement Confirmation: positive ETCO2

## 2023-05-16 NOTE — Transfer of Care (Signed)
 Immediate Anesthesia Transfer of Care Note  Patient: Sara Fischer  Procedure(s) Performed: ESOPHAGOGASTRODUODENOSCOPY (EGD) WITH PROPOFOL  Patient Location: Endoscopy Unit  Anesthesia Type:General  Level of Consciousness: awake  Airway & Oxygen Therapy: Patient Spontanous Breathing  Post-op Assessment: Report given to RN and Post -op Vital signs reviewed and stable  Post vital signs: Reviewed and stable  Last Vitals:  Vitals Value Taken Time  BP    Temp    Pulse    Resp    SpO2      Last Pain:  Vitals:   05/16/23 0732  TempSrc:   PainSc: 0-No pain      Patients Stated Pain Goal: 8 (05/16/23 0651)  Complications: No notable events documented.

## 2023-05-16 NOTE — Op Note (Signed)
 [facility] Patient Name: Sara Fischer Procedure Date: 05/16/2023 7:06 AM MRN: [id number] Date of Birth: 10-26-1940 Attending MD: Gennette Pac , MD, [id number] CSN: [id number] Age: 83 Admit Type: Outpatient Procedure:                Upper GI endoscopy Indications:              Surveillance procedure, Ulcer of the GI tract Providers:                Gennette Pac, MD, Francoise Ceo RN, RN,                            Elinor Parkinson Referring MD:              Medicines:                Propofol per Anesthesia Complications:            No immediate complications. Estimated Blood Loss:     Estimated blood loss: none. Procedure:                Pre-Anesthesia Assessment:                           - Prior to the procedure, a History and Physical                            was performed, and patient medications and                            allergies were reviewed. The patient's tolerance of                            previous anesthesia was also reviewed. The risks                            and benefits of the procedure and the sedation                            options and risks were discussed with the patient.                            All questions were answered, and informed consent                            was obtained. Prior Anticoagulants: The patient has                            taken no anticoagulant or antiplatelet agents. ASA                            Grade Assessment: III - A patient with severe                            systemic disease. After reviewing the risks and  benefits, the patient was deemed in satisfactory                            condition to undergo the procedure.                           After obtaining informed consent, the endoscope was                            passed under direct vision. Throughout the                            procedure, the patient's blood pressure, pulse, and                             oxygen saturations were monitored continuously. The                            [id number]-[id number] ([id number]) scope was introduced through the                            mouth, and advanced to the second part of duodenum.                            The upper GI endoscopy was accomplished without                            difficulty. The patient tolerated the procedure                            well. Scope In: 7:37:08 AM Scope Out: 7:40:18 AM Total Procedure Duration: 0 hours 3 minutes 10 seconds  Findings:      The examined esophagus was normal.      A large hiatal hernia was present.      The duodenal bulb and second portion of the duodenum were normal. Impression:               - Normal esophagus.                           - Large hiatal hernia.                           - Normal duodenal bulb and second portion of the                            duodenum.                           - No specimens collected. Moderate Sedation:      Moderate (conscious) sedation was personally administered by an       anesthesia professional. The following parameters were monitored: oxygen       saturation, heart rate, blood pressure, and response to care. Total       physician intraservice time was 15 minutes. Recommendation:           -  Patient has a contact number available for                            emergencies. The signs and symptoms of potential                            delayed complications were discussed with the                            patient. Return to normal activities tomorrow.                            Written discharge instructions were provided to the                            patient.                           - Resume previous diet.                           - Continue present medications. May de-escalate                            pantoprazole 40 mg once daily as long as reflux                            symptoms are well-controlled. Avoid all NSAIDs                             moving forward.                           - Return to my office in 6 months. Procedure Code(s):        --- Professional ---                           [phone], Esophagogastroduodenoscopy, flexible,                            transoral; diagnostic, including collection of                            specimen(s) by brushing or washing, when performed                            (separate procedure) Diagnosis Code(s):        --- Professional ---                           K44.9, Diaphragmatic hernia without obstruction or                            gangrene                           R13.10, Dysphagia,  unspecified                           K28.9, Gastrojejunal ulcer, unspecified as acute or                            chronic, without hemorrhage or perforation CPT copyright 2022 American Medical Association. All rights reserved. The codes documented in this report are preliminary and upon coder review may  be revised to meet current compliance requirements. Sara Friends. Dakai Braithwaite, MD Gennette Pac, MD 05/16/2023 7:49:32 AM This report has been signed electronically. Number of Addenda: 0

## 2023-05-16 NOTE — Anesthesia Postprocedure Evaluation (Signed)
 Anesthesia Post Note  Patient: Sara Fischer  Procedure(s) Performed: ESOPHAGOGASTRODUODENOSCOPY (EGD) WITH PROPOFOL  Patient location during evaluation: Phase II Anesthesia Type: General Level of consciousness: awake Pain management: pain level controlled Vital Signs Assessment: post-procedure vital signs reviewed and stable Respiratory status: spontaneous breathing and respiratory function stable Cardiovascular status: blood pressure returned to baseline and stable Postop Assessment: no headache and no apparent nausea or vomiting Anesthetic complications: no Comments: Late entry   No notable events documented.   Last Vitals:  Vitals:   05/16/23 0659 05/16/23 0743  BP: (!) 165/60 (!) 109/45  Pulse: 82 92  Resp: 16 20  Temp:  36.8 C  SpO2: 96% 93%    Last Pain:  Vitals:   05/16/23 0743  TempSrc: Oral  PainSc: 0-No pain                 [name]

## 2023-05-16 NOTE — Discharge Instructions (Addendum)
 EGD Discharge instructions Please read the instructions outlined below and refer to this sheet in the next few weeks. These discharge instructions provide you with general information on caring for yourself after you leave the hospital. Your doctor may also give you specific instructions. While your treatment has been planned according to the most current medical practices available, unavoidable complications occasionally occur. If you have any problems or questions after discharge, please call your doctor. ACTIVITY You may resume your regular activity but move at a slower pace for the next 24 hours.  Take frequent rest periods for the next 24 hours.  Walking will help expel (get rid of) the air and reduce the bloated feeling in your abdomen.  No driving for 24 hours (because of the anesthesia (medicine) used during the test).  You may shower.  Do not sign any important legal documents or operate any machinery for 24 hours (because of the anesthesia used during the test).  NUTRITION Drink plenty of fluids.  You may resume your normal diet.  Begin with a light meal and progress to your normal diet.  Avoid alcoholic beverages for 24 hours or as instructed by your caregiver.  MEDICATIONS You may resume your normal medications unless your caregiver tells you otherwise.  WHAT YOU CAN EXPECT TODAY You may experience abdominal discomfort such as a feeling of fullness or "gas" pains.  FOLLOW-UP Your doctor will discuss the results of your test with you.  SEEK IMMEDIATE MEDICAL ATTENTION IF ANY OF THE FOLLOWING OCCUR: Excessive nausea (feeling sick to your stomach) and/or vomiting.  Severe abdominal pain and distention (swelling).  Trouble swallowing.  Temperature over 101 F (37.8 C).  Rectal bleeding or vomiting of blood.     Large hiatal hernia unchanged  Ulcer completely healed  May decrease pantoprazole to 40 mg once daily before breakfast  Continue to avoid all NSAID  medications  Office visit with [place] in 6 months.  Office will notify you.  At patient request, called Jayma Volpi [phone] to voicemail.  Left a message.

## 2023-05-16 NOTE — Anesthesia Preprocedure Evaluation (Addendum)
 Anesthesia Evaluation  Patient identified by MRN, date of birth, ID band Patient awake    Reviewed: Allergy & Precautions, H&P , NPO status , Patient's Chart, lab work & pertinent test results, reviewed documented beta blocker date and time   Airway Mallampati: II  TM Distance: >3 FB Neck ROM: full    Dental  (+) Edentulous Upper, Edentulous Lower   Pulmonary former smoker   Pulmonary exam normal breath sounds clear to auscultation       Cardiovascular Exercise Tolerance: Good hypertension,  Rhythm:regular Rate:Normal     Neuro/Psych negative neurological ROS  negative psych ROS   GI/Hepatic negative GI ROS, Neg liver ROS, PUD,,,  Endo/Other  negative endocrine ROS    Renal/GU negative Renal ROS  negative genitourinary   Musculoskeletal   Abdominal   Peds  Hematology negative hematology ROS (+) Blood dyscrasia, anemia   Anesthesia Other Findings   Reproductive/Obstetrics negative OB ROS                             Anesthesia Physical Anesthesia Plan  ASA: 2  Anesthesia Plan: General   Post-op Pain Management:    Induction:   PONV Risk Score and Plan: Propofol infusion  Airway Management Planned:   Additional Equipment:   Intra-op Plan:   Post-operative Plan:   Informed Consent: I have reviewed the patients History and Physical, chart, labs and discussed the procedure including the risks, benefits and alternatives for the proposed anesthesia with the patient or authorized representative who has indicated his/her understanding and acceptance.     Dental Advisory Given  Plan Discussed with: CRNA  Anesthesia Plan Comments:        Anesthesia Quick Evaluation

## 2023-05-16 NOTE — H&P (Signed)
 @LOGO @   Primary Care Physician:  Elfredia Nevins, MD Primary Gastroenterologist:  Dr. Jena Gauss  Pre-Procedure History & Physical: HPI:  Sara Fischer is a 83 y.o. female here for for ulcer surveillance.  Antral ulcer found previously biopsies negative for malignancy H. pylori NSAID related she is here for surveillance to assure healing. No dysphagia.  Past Medical History:  Diagnosis Date   High cholesterol    Hypertension    IDA (iron deficiency anemia)    Iron deficiency anemia due to chronic blood loss 01/16/2023   PUD (peptic ulcer disease) 12/2022    Past Surgical History:  Procedure Laterality Date   ABDOMINAL HYSTERECTOMY     APPENDECTOMY     BIOPSY  12/26/2022   Procedure: BIOPSY;  Surgeon: Corbin Ade, MD;  Location: [facility];  Service: Endoscopy;;   COLONOSCOPY  2010   Dr. Jena Gauss: pancolonic diverticulosis, sigmoid colon tubular adenoma removed.    COLONOSCOPY N/A 02/29/2016   Procedure: COLONOSCOPY;  Surgeon: Corbin Ade, MD;  Location: [facility];  Service: Endoscopy;  Laterality: N/A;  230   COLONOSCOPY WITH PROPOFOL N/A 12/26/2022   Surgeon: Corbin Ade, MD;   pancolonic diverticulosis, otherwise normal exam.   ESOPHAGOGASTRODUODENOSCOPY (EGD) WITH PROPOFOL N/A 12/26/2022   Surgeon: Corbin Ade, MD;  Normal esophagus, large hiatal hernia, antral ulcer with satellite erosions s/p biopsy.  Pathology was benign.  No H. pylori, metaplasia, dysplasia.   INCISIONAL HERNIA REPAIR N/A 12/28/2016   Procedure: HERNIA REPAIR INCISIONAL;  Surgeon: Franky Macho, MD;  Location: [facility];  Service: General;  Laterality: N/A;   KNEE ARTHROSCOPY     bilateral   LAPAROSCOPIC BILATERAL SALPINGO OOPHERECTOMY  2001   PARTIAL HYSTERECTOMY  1979    Prior to Admission medications   Medication Sig Start Date End Date Taking? Authorizing Provider  aspirin EC 81 MG tablet Take 81 mg by mouth daily.   Yes [provider]  Cholecalciferol (D3-1000 PO) Take 1  capsule by mouth daily.   Yes [provider]  Cyanocobalamin (VITAMIN B 12) 250 MCG LOZG Take by mouth.   Yes [provider]  fluticasone (FLONASE) 50 MCG/ACT nasal spray Place 1 spray into both nostrils daily as needed for allergies or rhinitis.   Yes [provider]  furosemide (LASIX) 40 MG tablet  12/08/19  Yes [provider]  ketoconazole (NIZORAL) 2 % cream Apply 1 application topically 2 (two) times daily as needed.  06/04/19  Yes [provider]  Menaquinone-7 (VITAMIN K2 PO) Take 1 tablet by mouth daily.   Yes [provider]  metoprolol succinate (TOPROL-XL) 100 MG 24 hr tablet Take 100 mg by mouth daily. 09/10/17  Yes [provider]  pantoprazole (PROTONIX) 40 MG tablet Take 40 mg by mouth 2 (two) times daily. 02/27/23  Yes [provider]  potassium chloride SA (KLOR-CON M) 20 MEQ tablet Take 1 tablet (20 mEq total) by mouth 2 (two) times daily. 01/11/23  Yes Renne Crigler, PA-C  Propylene Glycol 0.6 % SOLN Place 1 drop into both eyes 2 (two) times daily as needed (dry eyes).   Yes [provider]  simvastatin (ZOCOR) 20 MG tablet Take 20 mg by mouth at bedtime.    Yes [provider]    Allergies as of 04/05/2023 - Review Complete 04/03/2023  Allergen Reaction Noted   Feldene [piroxicam]  11/02/2013   Lodine [etodolac]  11/08/2010    Family History  Problem Relation Age of Onset  Diabetes Mother    Heart disease Mother    Heart disease Father 87   Dementia Sister    Heart disease Brother    Diabetes Sister    Heart disease Sister    Heart disease Sister    Diabetes Sister    Arthritis Sister    Heart disease Sister    Heart disease Brother    Healthy Son    Healthy Son    Colon cancer Neg Hx     Social History   Socioeconomic History   Marital status: Widowed    Spouse name: Not on file   Number of children: 4   Years of education: Not on file   Highest education level:  Not on file  Occupational History   Occupation: Retired  Tobacco Use   Smoking status: Former    Current packs/day: 0.00    Average packs/day: 0.5 packs/day for 5.2 years (2.6 ttl pk-yrs)    Types: Cigarettes    Start date: 08/28/1963    Quit date: 11/02/1968    Years since quitting: 54.5   Smokeless tobacco: Never  Vaping Use   Vaping status: Never Used  Substance and Sexual Activity   Alcohol use: Not Currently    Comment: wine occas.   Drug use: No   Sexual activity: Not on file  Other Topics Concern   Not on file  Social History Narrative   Right handed    Caffeine use: 1 cup coffee every morning   1 soda per day   Social Drivers of Health   Financial Resource Strain: Not on file  Food Insecurity: Not on file  Transportation Needs: Not on file  Physical Activity: Not on file  Stress: Not on file  Social Connections: Not on file  Intimate Partner Violence: Not on file    Review of Systems: See HPI, otherwise negative ROS  Physical Exam: BP (!) 165/60   Pulse 82   Temp 98.2 F (36.8 C) (Oral)   Resp 16   Ht 5\' 2"  (1.575 m)   Wt 66.2 kg   SpO2 96%   BMI 26.70 kg/m  General:   Alert,  Well-developed, well-nourished, pleasant and cooperative in NAD Skin:  Intact without significant lesions or rashes. Neck:  Supple; no masses or thyromegaly. No significant cervical adenopathy. Lungs:  Clear throughout to auscultation.   No wheezes, crackles, or rhonchi. No acute distress. Heart:  Regular rate and rhythm; no murmurs, clicks, rubs,  or gallops. Abdomen: Non-distended, normal bowel sounds.  Soft and nontender without appreciable mass or hepatosplenomegaly.  .  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  Here for surveillance EGD per plan. The risks, benefits, limitations, alternatives and imponderables have been reviewed with the patient. Potential for esophageal dilation, biopsy, etc. have also been reviewed.   Questions have been answered. All parties agreeable.      Notice: This dictation was prepared with Dragon dictation along with smaller phrase technology. Any transcriptional errors that result from this process are unintentional and may not be corrected upon review.

## 2023-05-17 ENCOUNTER — Encounter (HOSPITAL_COMMUNITY): Payer: Self-pay | Admitting: Internal Medicine

## 2023-05-27 DIAGNOSIS — D518 Other vitamin B12 deficiency anemias: Secondary | ICD-10-CM | POA: Diagnosis not present

## 2023-06-24 DIAGNOSIS — D518 Other vitamin B12 deficiency anemias: Secondary | ICD-10-CM | POA: Diagnosis not present

## 2023-07-26 DIAGNOSIS — D518 Other vitamin B12 deficiency anemias: Secondary | ICD-10-CM | POA: Diagnosis not present

## 2023-08-06 ENCOUNTER — Ambulatory Visit (HOSPITAL_COMMUNITY)
Admission: RE | Admit: 2023-08-06 | Discharge: 2023-08-06 | Disposition: A | Discharge status: Home / Self Care | Payer: Medicare HMO | Source: Ambulatory Visit | Attending: Urology | Admitting: Urology

## 2023-08-06 DIAGNOSIS — N200 Calculus of kidney: Secondary | ICD-10-CM | POA: Diagnosis not present

## 2023-08-06 DIAGNOSIS — N23 Unspecified renal colic: Secondary | ICD-10-CM | POA: Insufficient documentation

## 2023-08-12 ENCOUNTER — Emergency Department (HOSPITAL_COMMUNITY)

## 2023-08-12 ENCOUNTER — Emergency Department (HOSPITAL_COMMUNITY)
Admission: EM | Admit: 2023-08-12 | Discharge: 2023-08-12 | Disposition: A | Discharge status: Home / Self Care | Attending: Emergency Medicine | Admitting: Emergency Medicine

## 2023-08-12 ENCOUNTER — Encounter (HOSPITAL_COMMUNITY): Payer: Self-pay | Admitting: Emergency Medicine

## 2023-08-12 ENCOUNTER — Other Ambulatory Visit: Payer: Self-pay

## 2023-08-12 DIAGNOSIS — Z7982 Long term (current) use of aspirin: Secondary | ICD-10-CM | POA: Diagnosis not present

## 2023-08-12 DIAGNOSIS — K5732 Diverticulitis of large intestine without perforation or abscess without bleeding: Secondary | ICD-10-CM | POA: Insufficient documentation

## 2023-08-12 DIAGNOSIS — R1030 Lower abdominal pain, unspecified: Secondary | ICD-10-CM | POA: Diagnosis present

## 2023-08-12 DIAGNOSIS — K5792 Diverticulitis of intestine, part unspecified, without perforation or abscess without bleeding: Secondary | ICD-10-CM

## 2023-08-12 DIAGNOSIS — K449 Diaphragmatic hernia without obstruction or gangrene: Secondary | ICD-10-CM | POA: Diagnosis not present

## 2023-08-12 DIAGNOSIS — R109 Unspecified abdominal pain: Secondary | ICD-10-CM | POA: Diagnosis not present

## 2023-08-12 DIAGNOSIS — N3289 Other specified disorders of bladder: Secondary | ICD-10-CM | POA: Diagnosis not present

## 2023-08-12 LAB — COMPREHENSIVE METABOLIC PANEL WITH GFR
ALT: 11 U/L (ref 0–44)
AST: 18 U/L (ref 15–41)
Albumin: 4.1 g/dL (ref 3.5–5.0)
Alkaline Phosphatase: 70 U/L (ref 38–126)
Anion gap: 11 (ref 5–15)
BUN: 15 mg/dL (ref 8–23)
CO2: 26 mmol/L (ref 22–32)
Calcium: 9.7 mg/dL (ref 8.9–10.3)
Chloride: 100 mmol/L (ref 98–111)
Creatinine, Ser: 0.78 mg/dL (ref 0.44–1.00)
GFR, Estimated: >60 mL/min (ref >60)
Glucose, Bld: 107 mg/dL — ABNORMAL HIGH (ref 70–99)
Potassium: 3.9 mmol/L (ref 3.5–5.1)
Sodium: 137 mmol/L (ref 135–145)
Total Bilirubin: 0.9 mg/dL (ref 0.0–1.2)
Total Protein: 8.1 g/dL (ref 6.5–8.1)

## 2023-08-12 LAB — URINALYSIS, ROUTINE W REFLEX MICROSCOPIC
Bacteria, UA: NONE SEEN
Bilirubin Urine: NEGATIVE
Glucose, UA: NEGATIVE mg/dL
Hgb urine dipstick: NEGATIVE
Ketones, ur: NEGATIVE mg/dL
Nitrite: NEGATIVE
Protein, ur: NEGATIVE mg/dL
Specific Gravity, Urine: 1.018 (ref 1.005–1.030)
pH: 5 (ref 5.0–8.0)

## 2023-08-12 LAB — CBC
HCT: 36.6 % (ref 36.0–46.0)
Hemoglobin: 12.2 g/dL (ref 12.0–15.0)
MCH: 32.2 pg (ref 26.0–34.0)
MCHC: 33.3 g/dL (ref 30.0–36.0)
MCV: 96.6 fL (ref 80.0–100.0)
Platelets: 238 10*3/uL (ref 150–400)
RBC: 3.79 MIL/uL — ABNORMAL LOW (ref 3.87–5.11)
RDW: 13.1 % (ref 11.5–15.5)
WBC: 8.6 10*3/uL (ref 4.0–10.5)
nRBC: 0 % (ref 0.0–0.2)

## 2023-08-12 LAB — LIPASE, BLOOD: Lipase: 31 U/L (ref 11–51)

## 2023-08-12 MED ORDER — AMOXICILLIN-POT CLAVULANATE 875-125 MG PO TABS
1.0000 | ORAL_TABLET | Freq: Two times a day (BID) | ORAL | 0 refills | Status: AC
Start: 2023-08-12 — End: 2023-08-22

## 2023-08-12 MED ORDER — IOHEXOL 300 MG/ML  SOLN
100.0000 mL | Freq: Once | INTRAMUSCULAR | Status: AC | PRN
  Administered 2023-08-12: 100 mL via INTRAVENOUS

## 2023-08-12 MED ORDER — ONDANSETRON 4 MG PO TBDP: 4.0000 mg | ORAL_TABLET | Freq: Three times a day (TID) | ORAL | 0 refills | Status: AC | PRN

## 2023-08-12 MED ORDER — AMOXICILLIN-POT CLAVULANATE 875-125 MG PO TABS
1.0000 | ORAL_TABLET | Freq: Once | ORAL | Status: AC
  Administered 2023-08-12: 1 via ORAL
  Filled 2023-08-12: qty 1

## 2023-08-12 NOTE — Discharge Instructions (Signed)
 Please follow-up closely with gastroenterology and your primary care doctor on an outpatient basis.  Please take all antibiotics as directed.  Return to emergency department immediately for any new or worsening symptoms.

## 2023-08-12 NOTE — ED Notes (Signed)
Pt went to CT scan.

## 2023-08-12 NOTE — ED Provider Notes (Signed)
 [place] [facility] AT [facility] Provider Note   CSN: [id number] Arrival date & time: 08/12/23  1000     History  Chief Complaint  Patient presents with   Abdominal Pain    Sara Fischer is a 83 y.o. female.  Patient is an 83 year old female who presents emergency department the chief complaint of lower abdominal pain which has been ongoing for the past few days.  It did become more severe today.  Patient notes that she has had some associated nausea without vomiting.  She denies any associated chest pain, shortness of breath.  She denies any diarrhea, melena, hematochezia.  She has had no associated dysuria or hematuria.  She denies any associated fever or chills.  She has had no recent falls or blunt abdominal wall trauma.   Abdominal Pain Associated symptoms: nausea        Home Medications Prior to Admission medications   Medication Sig Start Date End Date Taking? Authorizing Provider  aspirin EC 81 MG tablet Take 81 mg by mouth daily.    [provider]  Cholecalciferol (D3-1000 PO) Take 1 capsule by mouth daily.    [provider]  Cyanocobalamin  (VITAMIN B 12) 250 MCG LOZG Take by mouth.    [provider]  fluticasone  (FLONASE ) 50 MCG/ACT nasal spray Place 1 spray into both nostrils daily as needed for allergies or rhinitis.    [provider]  furosemide (LASIX) 40 MG tablet  12/08/19   [provider]  ketoconazole (NIZORAL) 2 % cream Apply 1 application topically 2 (two) times daily as needed.  06/04/19   [provider]  Menaquinone-7 (VITAMIN K2 PO) Take 1 tablet by mouth daily.    [provider]  metoprolol  succinate (TOPROL -XL) 100 MG 24 hr tablet Take 100 mg by mouth daily. 09/10/17   [provider]  potassium chloride  SA (KLOR-CON  M) 20 MEQ tablet Take 1 tablet (20 mEq total) by mouth 2 (two) times daily. 01/11/23   Geiple, Joshua, PA-C  Propylene Glycol 0.6 % SOLN Place  1 drop into both eyes 2 (two) times daily as needed (dry eyes).    [provider]  simvastatin  (ZOCOR ) 20 MG tablet Take 20 mg by mouth at bedtime.     [provider]      Allergies    Feldene [piroxicam] and Lodine [etodolac]    Review of Systems   Review of Systems  Gastrointestinal:  Positive for abdominal pain and nausea.  All other systems reviewed and are negative.   Physical Exam Updated Vital Signs BP (!) 150/89 (BP Location: Right Arm)   Pulse 99   Temp 98 F (36.7 C) (Oral)   Resp 17   Ht 5\' 2"  (1.575 m)   Wt 66.7 kg   SpO2 96%   BMI 26.89 kg/m  Physical Exam Vitals and nursing note reviewed.  Constitutional:      Appearance: Normal appearance.  HENT:     Head: Normocephalic and atraumatic.     Nose: Nose normal.     Mouth/Throat:     Mouth: Mucous membranes are moist.  Eyes:     Extraocular Movements: Extraocular movements intact.     Conjunctiva/sclera: Conjunctivae normal.     Pupils: Pupils are equal, round, and reactive to light.  Cardiovascular:     Rate and Rhythm: Normal rate and regular rhythm.     Pulses: Normal pulses.     Heart sounds: Normal heart sounds. No murmur  heard.    No gallop.  Pulmonary:     Effort: Pulmonary effort is normal. No respiratory distress.     Breath sounds: Normal breath sounds. No stridor. No wheezing, rhonchi or rales.  Abdominal:     General: Abdomen is flat. Bowel sounds are normal. There is no distension. There are no signs of injury.     Palpations: Abdomen is soft.     Tenderness: There is abdominal tenderness in the suprapubic area and left lower quadrant. Negative signs include Murphy's sign and McBurney's sign.     Hernia: No hernia is present.  Musculoskeletal:        General: Normal range of motion.     Cervical back: Normal range of motion and neck supple.  Skin:    General: Skin is warm and dry.     Coloration: Skin is not jaundiced.     Findings: No erythema or rash.   Neurological:     General: No focal deficit present.     Mental Status: She is alert and oriented to person, place, and time. Mental status is at baseline.     Cranial Nerves: No cranial nerve deficit.     Motor: No weakness.  Psychiatric:        Mood and Affect: Mood normal.        Behavior: Behavior normal.        Thought Content: Thought content normal.        Judgment: Judgment normal.     ED Results / Procedures / Treatments   Labs (all labs ordered are listed, but only abnormal results are displayed) Labs Reviewed  COMPREHENSIVE METABOLIC PANEL WITH GFR - Abnormal; Notable for the following components:      Result Value   Glucose, Bld 107 (*)    All other components within normal limits  CBC - Abnormal; Notable for the following components:   RBC 3.79 (*)    All other components within normal limits  URINALYSIS, ROUTINE W REFLEX MICROSCOPIC - Abnormal; Notable for the following components:   APPearance HAZY (*)    Leukocytes,Ua SMALL (*)    All other components within normal limits  LIPASE, BLOOD    EKG None  Radiology No results found.  Procedures Procedures    Medications Ordered in ED Medications - No data to display  ED Course/ Medical Decision Making/ A&P                                 Medical Decision Making Amount and/or Complexity of Data Reviewed Labs: ordered.  Risk Prescription drug management.   This patient presents to the ED for concern of abdominal pain nausea differential diagnosis includes acute appendicitis, cholecystitis, bowel torsion, diverticulitis, ovarian torsion cyst, pyelonephritis, kidney stone, mesenteric ischemia, pancreatitis    Additional history obtained:  Additional history obtained from none External records from outside source obtained and reviewed including none   Lab Tests:  I Ordered, and personally interpreted labs.  The pertinent results include: No leukocytosis, no anemia, normal kidney function  liver function, normal electrolytes, unremarkable urinalysis   Imaging Studies ordered:  I ordered imaging studies including CT scan of abdomen and pelvis I independently visualized and interpreted imaging which showed acute uncomplicated diverticulitis, no signs of abscess or perforation I agree with the radiologist interpretation   Medicines ordered and prescription drug management:  I ordered medication including Augmentin, Zofran  for acute diverticulitis, nausea Reevaluation of  the patient after these medicines showed that the patient improved I have reviewed the patients home medicines and have made adjustments as needed   Problem List / ED Course:  Patient is doing well at this time and is stable for discharge home.  Discussed with patient CT scan is consistent with acute uncomplicated diverticulitis.  Patient has stable vital signs at this point and does not meet sepsis criteria.  Do not suspect that admission is warranted at this time.  Patient has no findings to suggest abscess or perforation at this point.  She is otherwise well-appearing and has been tolerating p.o. intake.  No other acute surgical process was noted within the abdomen and pelvis.  Close follow-up with her primary care doctor and GI was discussed.  Strict turn precautions were discussed for any new or worsening symptoms.  Patient voiced understand to the plan and had no additional questions.   Social Determinants of Health:  None           Final Clinical Impression(s) / ED Diagnoses Final diagnoses:  None    Rx / DC Orders ED Discharge Orders     None         Sara Fischer 08/12/23 1731    Ninetta Basket, MD 08/16/23 [phone]

## 2023-08-12 NOTE — ED Triage Notes (Signed)
 Pt c/o abdominal pain since yesterday with nausea, denies d/v/fever

## 2023-08-12 NOTE — ED Provider Triage Note (Signed)
 Emergency Medicine Provider Triage Evaluation Note  Sara Fischer , a 83 y.o. female  was evaluated in triage.  Pt complains of lower abdominal pain.  Pt reports severe pain today   Review of Systems  Positive: nausea Negative: diarrhea  Physical Exam  BP (!) 150/89 (BP Location: Right Arm)   Pulse 99   Temp 98 F (36.7 C) (Oral)   Resp 17   Ht 5\' 2"  (1.575 m)   Wt 66.7 kg   SpO2 96%   BMI 26.89 kg/m  Gen:   Awake, no distress   Resp:  Normal effort  MSK:   Moves extremities without difficulty  Other:  Abdomen tender lower abdomen  Medical Decision Making  Medically screening exam initiated at 12:32 PM.  Appropriate orders placed.  MARYFER TAUZIN was informed that the remainder of the evaluation will be completed by another provider, this initial triage assessment does not replace that evaluation, and the importance of remaining in the ED until their evaluation is complete.     Sandi Crosby, PA-C 08/12/23 1233

## 2023-08-13 DIAGNOSIS — H43393 Other vitreous opacities, bilateral: Secondary | ICD-10-CM | POA: Diagnosis not present

## 2023-08-16 ENCOUNTER — Encounter: Payer: Self-pay | Admitting: Urology

## 2023-08-16 ENCOUNTER — Ambulatory Visit: Payer: Medicare HMO | Admitting: Urology

## 2023-08-16 VITALS — BP 139/68 | HR 82

## 2023-08-16 DIAGNOSIS — N200 Calculus of kidney: Secondary | ICD-10-CM | POA: Diagnosis not present

## 2023-08-16 DIAGNOSIS — N23 Unspecified renal colic: Secondary | ICD-10-CM | POA: Diagnosis not present

## 2023-08-16 LAB — MICROSCOPIC EXAMINATION: Bacteria, UA: NONE SEEN

## 2023-08-16 LAB — URINALYSIS, ROUTINE W REFLEX MICROSCOPIC
Bilirubin, UA: NEGATIVE
Glucose, UA: NEGATIVE
Ketones, UA: NEGATIVE
Nitrite, UA: NEGATIVE
Protein,UA: NEGATIVE
Specific Gravity, UA: 1.025 (ref 1.005–1.030)
Urobilinogen, Ur: 1 mg/dL (ref 0.2–1.0)
pH, UA: 6 (ref 5.0–7.5)

## 2023-08-16 NOTE — Progress Notes (Signed)
 08/16/2023 9:32 AM   Sara Fischer [birth year] [id number]  Referring provider: Kathyleen Parkins, MD [address] [place],  [place] [address]  Followup nephrolithiasis   HPI: Sara Fischer is a 82yo here for followup for nephrolithiasis. No stone events since last visit. She denies any flank pain. CT 6/2 showed no calculi and no hydronephrosis. She is currently on treatment for diverticulitis. She has had 2 stone events in the past. She denies any worsening LUTS   PMH: Past Medical History:  Diagnosis Date   High cholesterol    Hypertension    IDA (iron  deficiency anemia)    Iron  deficiency anemia due to chronic blood loss 01/16/2023   PUD (peptic ulcer disease) 12/2022    Surgical History: Past Surgical History:  Procedure Laterality Date   ABDOMINAL HYSTERECTOMY     APPENDECTOMY     BIOPSY  12/26/2022   Procedure: BIOPSY;  Surgeon: Suzette Espy, MD;  [facility];  Service: Endoscopy;;   COLONOSCOPY  2010   Dr. Riley Cheadle: pancolonic diverticulosis, sigmoid colon tubular adenoma removed.    COLONOSCOPY N/A 02/29/2016   Procedure: COLONOSCOPY;  Surgeon: Suzette Espy, MD;  [facility];  Service: Endoscopy;  Laterality: N/A;  230   COLONOSCOPY WITH PROPOFOL  N/A 12/26/2022   Surgeon: Suzette Espy, MD;   pancolonic diverticulosis, otherwise normal exam.   ESOPHAGOGASTRODUODENOSCOPY (EGD) WITH PROPOFOL  N/A 12/26/2022   Surgeon: Suzette Espy, MD;  Normal esophagus, large hiatal hernia, antral ulcer with satellite erosions s/p biopsy.  Pathology was benign.  No H. pylori, metaplasia, dysplasia.   ESOPHAGOGASTRODUODENOSCOPY (EGD) WITH PROPOFOL  N/A 05/16/2023   Procedure: ESOPHAGOGASTRODUODENOSCOPY (EGD) WITH PROPOFOL ;  Surgeon: Suzette Espy, MD;  [facility];  Service: Endoscopy;  Laterality: N/A;  7:30 am, asa 2   INCISIONAL HERNIA REPAIR N/A 12/28/2016   Procedure: HERNIA REPAIR INCISIONAL;  Surgeon: Alanda Allegra, MD;  Location: [facility];  Service: General;  Laterality: N/A;   KNEE ARTHROSCOPY     bilateral   LAPAROSCOPIC BILATERAL SALPINGO OOPHERECTOMY  2001   PARTIAL HYSTERECTOMY  1979    Home Medications:  Allergies as of 08/16/2023       Reactions   Feldene [piroxicam]    Headaches    Lodine [etodolac]    headaches        Medication List        Accurate as of August 16, 2023  9:32 AM. If you have any questions, ask your nurse or doctor.          amoxicillin-clavulanate 875-125 MG tablet Commonly known as: AUGMENTIN Take 1 tablet by mouth every 12 (twelve) hours for 10 days.   aspirin EC 81 MG tablet Take 81 mg by mouth daily.   D3-1000 PO Take 1 capsule by mouth daily.   fluticasone  50 MCG/ACT nasal spray Commonly known as: FLONASE  Place 1 spray into both nostrils daily as needed for allergies or rhinitis.   furosemide 40 MG tablet Commonly known as: LASIX   ketoconazole 2 % cream Commonly known as: NIZORAL Apply 1 application topically 2 (two) times daily as needed.   metoprolol  succinate 100 MG 24 hr tablet Commonly known as: TOPROL -XL Take 100 mg by mouth daily.   ondansetron  4 MG disintegrating tablet Commonly known as: ZOFRAN -ODT Take 1 tablet (4 mg total) by mouth every 8 (eight) hours as needed for nausea or vomiting.   potassium chloride  SA 20 MEQ tablet Commonly known as: KLOR-CON  M Take 1  tablet (20 mEq total) by mouth 2 (two) times daily.   Propylene Glycol 0.6 % Soln Place 1 drop into both eyes 2 (two) times daily as needed (dry eyes).   simvastatin  20 MG tablet Commonly known as: ZOCOR  Take 20 mg by mouth at bedtime.   Vitamin B 12 250 MCG Lozg Take by mouth.   VITAMIN K2 PO Take 1 tablet by mouth daily.        Allergies:  Allergies  Allergen Reactions   Feldene [Piroxicam]     Headaches    Lodine [Etodolac]     headaches    Family History: Family History  Problem Relation Age of Onset   Diabetes Mother    Heart disease Mother    Heart  disease Father 29   Dementia Sister    Heart disease Brother    Diabetes Sister    Heart disease Sister    Heart disease Sister    Diabetes Sister    Arthritis Sister    Heart disease Sister    Heart disease Brother    Healthy Son    Healthy Son    Colon cancer Neg Hx     Social History:  reports that she quit smoking about 54 years ago. Her smoking use included cigarettes. She started smoking about 60 years ago. She has a 2.6 pack-year smoking history. She has never used smokeless tobacco. She reports that she does not currently use alcohol. She reports that she does not use drugs.  ROS: All other review of systems were reviewed and are negative except what is noted above in HPI  Physical Exam: BP 139/68   Pulse 82   Constitutional:  Alert and oriented, No acute distress. HEENT: [place] AT, moist mucus membranes.  Trachea midline, no masses. Cardiovascular: No clubbing, cyanosis, or edema. Respiratory: Normal respiratory effort, no increased work of breathing. GI: Abdomen is soft, nontender, nondistended, no abdominal masses GU: No CVA tenderness.  Lymph: No cervical or inguinal lymphadenopathy. Skin: No rashes, bruises or suspicious lesions. Neurologic: Grossly intact, no focal deficits, moving all 4 extremities. Psychiatric: Normal mood and affect.  Laboratory Data: Lab Results  Component Value Date   WBC 8.6 08/12/2023   HGB 12.2 08/12/2023   HCT 36.6 08/12/2023   MCV 96.6 08/12/2023   PLT 238 08/12/2023    Lab Results  Component Value Date   CREATININE 0.78 08/12/2023    No results found for: "PSA"  No results found for: "TESTOSTERONE"  No results found for: "HGBA1C"  Urinalysis    Component Value Date/Time   COLORURINE YELLOW 08/12/2023 1412   APPEARANCEUR HAZY (A) 08/12/2023 1412   APPEARANCEUR Hazy (A) 02/15/2023 1151   LABSPEC 1.018 08/12/2023 1412   PHURINE 5.0 08/12/2023 1412   GLUCOSEU NEGATIVE 08/12/2023 1412   HGBUR NEGATIVE 08/12/2023 1412    BILIRUBINUR NEGATIVE 08/12/2023 1412   BILIRUBINUR Negative 02/15/2023 1151   KETONESUR NEGATIVE 08/12/2023 1412   PROTEINUR NEGATIVE 08/12/2023 1412   NITRITE NEGATIVE 08/12/2023 1412   LEUKOCYTESUR SMALL (A) 08/12/2023 1412    Lab Results  Component Value Date   LABMICR See below: 02/15/2023   WBCUA 6-10 (A) 02/15/2023   LABEPIT 0-10 02/15/2023   BACTERIA NONE SEEN 08/12/2023    Pertinent Imaging: Ct 08/12/2023: Images reviewed and discussed with patient  Results for orders placed in visit on 02/15/23  DG Abd 1 View  Narrative CLINICAL DATA:  Renal colic  EXAM: ABDOMEN - 1 VIEW  COMPARISON:  January 11, 2023  FINDINGS: Air and stool filled nondilated loops of bowel. Moderate colonic stool burden throughout the colon. No definitive radiopaque densities project over the contours of the kidneys. Previously described RIGHT UVJ stone is not definitively visualized. Degenerative changes of the lumbar spine. Atherosclerotic calcifications. Pelvic phleboliths.  IMPRESSION: Previously described RIGHT UVJ stone is not definitively visualized radiographically. If persistent clinical concern, recommend dedicated cross-sectional imaging.   Electronically Signed By: Clancy Crimes M.D. On: 02/26/2023 10:30  No results found for this or any previous visit.  No results found for this or any previous visit.  No results found for this or any previous visit.  Results for orders placed during the hospital encounter of 08/06/23  US  RENAL  Narrative CLINICAL DATA:  Nephrolithiasis  EXAM: RENAL / URINARY TRACT ULTRASOUND COMPLETE  COMPARISON:  CT renal stone cortical January 11, 2023  FINDINGS: Right Kidney:  Renal measurements: 10.3 x 5.1 x 5.5 cm = volume: 150.6 mL. Echogenicity within normal limits. No mass or hydronephrosis visualized. Mild fullness of right renal pelvis.  Left Kidney:  Renal measurements: 9.6 x 4.5 x 4.4 cm = volume: 99.5 mL. Echogenicity  within normal limits. No mass or hydronephrosis visualized.  Bladder:  Appears normal for degree of bladder distention.  Other:  None.  IMPRESSION: Mild fullness of right renal pelvis. No hydronephrosis.   Electronically Signed By: Anna Barnes M.D. On: 08/06/2023 16:48  No results found for this or any previous visit.  No results found for this or any previous visit.  Results for orders placed during the hospital encounter of 01/11/23  CT Renal Stone Study  Narrative CLINICAL DATA:  Right-sided flank pain  EXAM: CT ABDOMEN AND PELVIS WITHOUT CONTRAST  TECHNIQUE: Multidetector CT imaging of the abdomen and pelvis was performed following the standard protocol without IV contrast.  RADIATION DOSE REDUCTION: This exam was performed according to the departmental dose-optimization program which includes automated exposure control, adjustment of the mA and/or kV according to patient size and/or use of iterative reconstruction technique.  COMPARISON:  Contrast CT 07/13/2020.  FINDINGS: Lower chest: There is slight linear opacity lung bases likely scar or atelectasis. No pleural effusion. Moderate hiatal hernia.  Hepatobiliary: No focal liver abnormality is seen. No gallstones, gallbladder wall thickening, or biliary dilatation.  Pancreas: Unremarkable. No pancreatic ductal dilatation or surrounding inflammatory changes.  Spleen: Normal in size without focal abnormality.  Adrenals/Urinary Tract: Adrenal glands are preserved. No abnormal calcification seen in the left kidney nor along the course of the left ureter. No right-sided renal collecting system dilatation. Exophytic from the medial aspect of the left kidney is a 15 mm low-attenuation structure with Hounsfield units of 10 consistent with a Bosniak 1 cyst. No specific imaging follow-up.  There is moderate perinephric stranding on the right with mild collecting system dilatation. An ectatic ureter seen  down to the UVJ where there is punctate stone. Separate punctate lower pole stone in the right kidney best seen on coronal imaging. Preserved contours of the urinary bladder.  Stomach/Bowel: On this non oral contrast exam, the large bowel has a normal course and caliber with scattered stool left-sided colonic diverticulosis. Small bowel is nondilated. No free air or free fluid. Once again there is a moderate hiatal hernia.  Vascular/Lymphatic: Diffuse vascular calcifications identified including along the branch vessels. Potential areas of significant stenosis are suggested. Please correlate with any particular symptoms. Normal caliber aorta. No specific abnormal lymph node enlargement identified in the abdomen and pelvis.  Reproductive: Status post hysterectomy.  No adnexal masses.  Other: Previous anterior pelvic wall hernia repair. Small umbilical hernia. No free air. No significant free fluid.  Musculoskeletal: Osteopenia. Scattered degenerative changes of the spine. 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  IMPRESSION: Right-sided renal collecting system dilatation with perinephric stranding and a punctate right UVJ stone.  Colonic diverticulosis.  Hiatal hernia.   Electronically Signed By: Adrianna Horde M.D. On: 01/11/2023 11:54   Assessment & Plan:    1. Nephrolithiasis Dietary handout given -followup PRN - Urinalysis, Routine w reflex microscopic   No follow-ups on file.  Johnie Nailer, MD  [facility] [place]

## 2023-08-16 NOTE — Patient Instructions (Signed)

## 2023-08-17 NOTE — Progress Notes (Unsigned)
 [facility] [address], [place] [address]   CLINIC:  Medical Oncology/Hematology  PCP:  Kathyleen Parkins, MD [address] [place] [address] [phone]   REASON FOR VISIT:  Follow-up for iron  deficiency anemia   CURRENT THERAPY: Oral iron  tablets and intermittent IV iron    INTERVAL HISTORY:   Ms. Yingst 83 y.o. female returns for routine follow-up of her iron  deficiency anemia.  She was last seen by Sheril Dines PA-C on 04/23/2023.     At today's visit, she reports feeling well. She denies any fatigue, pica, headaches, lightheadedness, syncope. She has not had any chest pain or dyspnea on exertion. She denies any frank hematochezia or melena. She takes B12 tablet daily and is also getting B12 shots monthly from Dr. Lewayne Records. She stopped taking iron  due to side effects. She takes aspirin 81 mg daily.    She continues to remain active and goes dancing at least 4 nights per week, and also enjoys spending time with her great-grandchildren.  She has 75% energy and 100% appetite. She endorses that she is maintaining a stable weight.  ASSESSMENT & PLAN:  1.  Normocytic anemia with iron  deficiency - Referred by primary care provider (Dr. Lewayne Records) in March 2022 - CBC on 04/28/2019 at Dr. Alvin Axe office showed hemoglobin 9.5, MCV of 89. White count was slightly low at 3.4 and platelets were 229. Differential was normal.  Ferritin was 15.  Normal B12 and folic acid . Percent saturation was 9.0, reticulocyte was 2% - Hemoccult POSITIVE 3/3 in September 2024 - EGD (12/26/2022): Normal esophagus, large hiatal hernia, antral ulcer with satellite erosions - Most recent EGD (05/16/2023) shows large hiatal hernia, but previously noted ulcer has resolved. - Colonoscopy (12/26/2022): Diverticulosis in entire colon, otherwise normal exam - SPEP negative, LDH normal, stool occult blood normal.  Normal creatinine without evidence of kidney dysfunction. - She was  unable to tolerate oral iron  due to constipation. - Vitamin B12 is being managed by her PCP, currently taking daily supplement and receiving monthly B12 injections. - Most recent IV iron  with Venofer  400 mg x 1 on 01/22/2023 - Most recent labs (08/19/2023): Hgb 11.2/MCV 95.3, ferritin 199, iron  saturation 14% - No bright red blood per rectum or melena. - PLAN: No indication for IV iron  at this time. - Labs (CBC/D, ferritin, iron /TIBC) and RTC in 6 months  PLAN SUMMARY:   >> Same-day labs (CBC/D, ferritin, iron /TIBC) + OFFICE visit in 6 months    REVIEW OF SYSTEMS:   Review of Systems  Constitutional:  Negative for appetite change, chills, diaphoresis, fatigue, fever and unexpected weight change.  HENT:   Negative for lump/mass and nosebleeds.   Eyes:  Negative for eye problems.  Respiratory:  Negative for cough, hemoptysis and shortness of breath.   Cardiovascular:  Negative for chest pain, leg swelling and palpitations.  Gastrointestinal:  Negative for abdominal pain, blood in stool, constipation, diarrhea, nausea and vomiting.  Genitourinary:  Negative for difficulty urinating and hematuria.   Skin: Negative.   Neurological:  Positive for headaches (occasional) and numbness (fingertips). Negative for dizziness and light-headedness.  Hematological:  Does not bruise/bleed easily.     PHYSICAL EXAM:  ECOG PERFORMANCE STATUS: 0 - Asymptomatic  Vitals:   08/19/23 0906 08/19/23 0907  BP: (!) 149/73 120/67  Pulse: 77   Resp: 18   Temp: (!) 96.2 F (35.7 C)   SpO2: 93%      Filed Weights   08/19/23 0906  Weight: 142  lb 6.7 oz (64.6 kg)    Physical Exam Constitutional:      Appearance: Normal appearance. She is normal weight.  Cardiovascular:     Heart sounds: Normal heart sounds.  Pulmonary:     Breath sounds: Normal breath sounds.  Neurological:     General: No focal deficit present.     Mental Status: Mental status is at baseline.  Psychiatric:        Behavior:  Behavior normal. Behavior is cooperative.    PAST MEDICAL/SURGICAL HISTORY:  Past Medical History:  Diagnosis Date   High cholesterol    Hypertension    IDA (iron  deficiency anemia)    Iron  deficiency anemia due to chronic blood loss 01/16/2023   PUD (peptic ulcer disease) 12/2022   Past Surgical History:  Procedure Laterality Date   ABDOMINAL HYSTERECTOMY     APPENDECTOMY     BIOPSY  12/26/2022   Procedure: BIOPSY;  Surgeon: Suzette Espy, MD;  [facility];  Service: Endoscopy;;   COLONOSCOPY  2010   Dr. Riley Cheadle: pancolonic diverticulosis, sigmoid colon tubular adenoma removed.    COLONOSCOPY N/A 02/29/2016   Procedure: COLONOSCOPY;  Surgeon: Suzette Espy, MD;  [facility];  Service: Endoscopy;  Laterality: N/A;  230   COLONOSCOPY WITH PROPOFOL  N/A 12/26/2022   Surgeon: Suzette Espy, MD;   pancolonic diverticulosis, otherwise normal exam.   ESOPHAGOGASTRODUODENOSCOPY (EGD) WITH PROPOFOL  N/A 12/26/2022   Surgeon: Suzette Espy, MD;  Normal esophagus, large hiatal hernia, antral ulcer with satellite erosions s/p biopsy.  Pathology was benign.  No H. pylori, metaplasia, dysplasia.   ESOPHAGOGASTRODUODENOSCOPY (EGD) WITH PROPOFOL  N/A 05/16/2023   Procedure: ESOPHAGOGASTRODUODENOSCOPY (EGD) WITH PROPOFOL ;  Surgeon: Suzette Espy, MD;  [facility];  Service: Endoscopy;  Laterality: N/A;  7:30 am, asa 2   INCISIONAL HERNIA REPAIR N/A 12/28/2016   Procedure: HERNIA REPAIR INCISIONAL;  Surgeon: Alanda Allegra, MD;  Location: [facility];  Service: General;  Laterality: N/A;   KNEE ARTHROSCOPY     bilateral   LAPAROSCOPIC BILATERAL SALPINGO OOPHERECTOMY  2001   PARTIAL HYSTERECTOMY  1979    SOCIAL HISTORY:  Social History   Socioeconomic History   Marital status: Widowed    Spouse name: Not on file   Number of children: 4   Years of education: Not on file   Highest education level: Not on file  Occupational History   Occupation: Retired   Tobacco Use   Smoking status: Former    Current packs/day: 0.00    Average packs/day: 0.5 packs/day for 5.2 years (2.6 ttl pk-yrs)    Types: Cigarettes    Start date: 08/28/1963    Quit date: 11/02/1968    Years since quitting: 54.8   Smokeless tobacco: Never  Vaping Use   Vaping status: Never Used  Substance and Sexual Activity   Alcohol use: Not Currently    Comment: wine occas.   Drug use: No   Sexual activity: Not on file  Other Topics Concern   Not on file  Social History Narrative   Right handed    Caffeine use: 1 cup coffee every morning   1 soda per day   Social Drivers of Health   Financial Resource Strain: Not on file  Food Insecurity: Not on file  Transportation Needs: Not on file  Physical Activity: Not on file  Stress: Not on file  Social Connections: Not on file  Intimate Partner Violence: Not on file  FAMILY HISTORY:  Family History  Problem Relation Age of Onset   Diabetes Mother    Heart disease Mother    Heart disease Father 66   Dementia Sister    Heart disease Brother    Diabetes Sister    Heart disease Sister    Heart disease Sister    Diabetes Sister    Arthritis Sister    Heart disease Sister    Heart disease Brother    Healthy Son    Healthy Son    Colon cancer Neg Hx     CURRENT MEDICATIONS:  Outpatient Encounter Medications as of 08/19/2023  Medication Sig   amoxicillin -clavulanate (AUGMENTIN ) 875-125 MG tablet Take 1 tablet by mouth every 12 (twelve) hours for 10 days.   aspirin EC 81 MG tablet Take 81 mg by mouth daily.   Cholecalciferol (D3-1000 PO) Take 1 capsule by mouth daily.   Cyanocobalamin  (VITAMIN B 12) 250 MCG LOZG Take by mouth.   fluticasone  (FLONASE ) 50 MCG/ACT nasal spray Place 1 spray into both nostrils daily as needed for allergies or rhinitis.   furosemide (LASIX) 40 MG tablet    ketoconazole (NIZORAL) 2 % cream Apply 1 application topically 2 (two) times daily as needed.    Menaquinone-7 (VITAMIN K2 PO) Take  1 tablet by mouth daily.   metoprolol  succinate (TOPROL -XL) 100 MG 24 hr tablet Take 100 mg by mouth daily.   ondansetron  (ZOFRAN -ODT) 4 MG disintegrating tablet Take 1 tablet (4 mg total) by mouth every 8 (eight) hours as needed for nausea or vomiting.   potassium chloride  SA (KLOR-CON  M) 20 MEQ tablet Take 1 tablet (20 mEq total) by mouth 2 (two) times daily.   Propylene Glycol 0.6 % SOLN Place 1 drop into both eyes 2 (two) times daily as needed (dry eyes).   simvastatin  (ZOCOR ) 20 MG tablet Take 20 mg by mouth at bedtime.    No facility-administered encounter medications on file as of 08/19/2023.    ALLERGIES:  Allergies  Allergen Reactions   Feldene [Piroxicam]     Headaches    Lodine [Etodolac]     headaches    LABORATORY DATA:  I have reviewed the labs as listed.  CBC    Component Value Date/Time   WBC 9.8 08/19/2023 0818   RBC 3.62 (L) 08/19/2023 0818   HGB 11.2 (L) 08/19/2023 0818   HCT 34.5 (L) 08/19/2023 0818   PLT 334 08/19/2023 0818   MCV 95.3 08/19/2023 0818   MCH 30.9 08/19/2023 0818   MCHC 32.5 08/19/2023 0818   RDW 12.5 08/19/2023 0818   LYMPHSABS 2.7 08/19/2023 0818   MONOABS 1.0 08/19/2023 0818   EOSABS 0.2 08/19/2023 0818   BASOSABS 0.0 08/19/2023 0818      Latest Ref Rng & Units 08/12/2023   11:01 AM 05/14/2023    9:20 AM 01/11/2023    9:43 AM  CMP  Glucose 70 - 99 mg/dL 045  409  811   BUN 8 - 23 mg/dL 15  12  16    Creatinine 0.44 - 1.00 mg/dL 9.14  7.82  9.56   Sodium 135 - 145 mmol/L 137  140  141   Potassium 3.5 - 5.1 mmol/L 3.9  3.7  2.8   Chloride 98 - 111 mmol/L 100  103  98   CO2 22 - 32 mmol/L 26  24  30    Calcium 8.9 - 10.3 mg/dL 9.7  9.9  9.6   Total Protein 6.5 - 8.1 g/dL 8.1  6.5   Total Bilirubin 0.0 - 1.2 mg/dL 0.9   0.6   Alkaline Phos 38 - 126 U/L 70   62   AST 15 - 41 U/L 18   20   ALT 0 - 44 U/L 11   12     DIAGNOSTIC IMAGING:  I have independently reviewed the relevant imaging and discussed with the patient.   WRAP UP:   All questions were answered. The patient knows to call the clinic with any problems, questions or concerns.  Medical decision making: Low  Time spent on visit: I spent 15 minutes counseling the patient face to face. The total time spent in the appointment was 22 minutes and more than 50% was on counseling.  Sonnie Dusky, PA-C  08/19/23 9:49 AM

## 2023-08-19 ENCOUNTER — Inpatient Hospital Stay: Payer: Medicare HMO | Attending: Physician Assistant | Admitting: Physician Assistant

## 2023-08-19 ENCOUNTER — Inpatient Hospital Stay: Payer: Medicare HMO

## 2023-08-19 DIAGNOSIS — Z87891 Personal history of nicotine dependence: Secondary | ICD-10-CM | POA: Diagnosis not present

## 2023-08-19 DIAGNOSIS — D500 Iron deficiency anemia secondary to blood loss (chronic): Secondary | ICD-10-CM

## 2023-08-19 DIAGNOSIS — D509 Iron deficiency anemia, unspecified: Secondary | ICD-10-CM | POA: Insufficient documentation

## 2023-08-19 LAB — CBC WITH DIFFERENTIAL/PLATELET
Abs Immature Granulocytes: 0.09 10*3/uL — ABNORMAL HIGH (ref 0.00–0.07)
Basophils Absolute: 0 10*3/uL (ref 0.0–0.1)
Basophils Relative: 0 %
Eosinophils Absolute: 0.2 10*3/uL (ref 0.0–0.5)
Eosinophils Relative: 3 %
HCT: 34.5 % — ABNORMAL LOW (ref 36.0–46.0)
Hemoglobin: 11.2 g/dL — ABNORMAL LOW (ref 12.0–15.0)
Immature Granulocytes: 1 %
Lymphocytes Relative: 28 %
Lymphs Abs: 2.7 10*3/uL (ref 0.7–4.0)
MCH: 30.9 pg (ref 26.0–34.0)
MCHC: 32.5 g/dL (ref 30.0–36.0)
MCV: 95.3 fL (ref 80.0–100.0)
Monocytes Absolute: 1 10*3/uL (ref 0.1–1.0)
Monocytes Relative: 10 %
Neutro Abs: 5.7 10*3/uL (ref 1.7–7.7)
Neutrophils Relative %: 58 %
Platelets: 334 10*3/uL (ref 150–400)
RBC: 3.62 MIL/uL — ABNORMAL LOW (ref 3.87–5.11)
RDW: 12.5 % (ref 11.5–15.5)
WBC: 9.8 10*3/uL (ref 4.0–10.5)
nRBC: 0 % (ref 0.0–0.2)

## 2023-08-19 LAB — IRON AND TIBC
Iron: 43 ug/dL (ref 28–170)
Saturation Ratios: 14 % (ref 10.4–31.8)
TIBC: 300 ug/dL (ref 250–450)
UIBC: 257 ug/dL

## 2023-08-19 LAB — FERRITIN: Ferritin: 199 ng/mL (ref 11–307)

## 2023-08-19 NOTE — Patient Instructions (Signed)
 [facility] at [facility] **VISIT SUMMARY & IMPORTANT INSTRUCTIONS **   You were seen today by Sheril Dines PA-C for your iron  deficiency anemia.   Your blood and iron  levels look great! You do not need any IV iron . We will see you for follow-up visit in 6 months -- however, please call us  sooner if your are experiencing increased fatigue, ice cravings, or other symptoms that may indicate your blood or iron  levels are dropping. Continue follow-up with Dr. Riley Cheadle (gastroenterology) [facility], as directed by their office.  ** Thank you for trusting me with your healthcare!  I strive to provide all of my patients with quality care at each visit.  If you receive a survey for this visit, I would be so grateful to you for taking the time to provide feedback.  Thank you in advance!  ~ Agnieszka Newhouse                   Dr. Paulett Boros   &   Sheril Dines, PA-C   - - - - - - - - - - - - - - - - - -    Thank you for choosing [facility] at [facility] to provide your oncology and hematology care.  To afford each patient quality time with our provider, please arrive at least 15 minutes before your scheduled appointment time.   If you have a lab appointment with the Cancer Center please come in thru the Main Entrance and check in at the main information desk.  You need to re-schedule your appointment should you arrive 10 or more minutes late.  We strive to give you quality time with our providers, and arriving late affects you and other patients whose appointments are after yours.  Also, if you no show three or more times for appointments you may be dismissed from the clinic at the providers discretion.     Again, thank you for choosing [facility].  Our hope is that these requests will decrease the amount of time that you wait before being seen by our physicians.        _____________________________________________________________  Should you have questions after your visit to [facility], please contact our office at [phone] and follow the prompts.  Our office hours are 8:00 a.m. and 4:30 p.m. Monday - Friday.  Please note that voicemails left after 4:00 p.m. may not be returned until the following business day.  We are closed weekends and major holidays.  You do have access to a nurse 24-7, just call the main number to the clinic [phone] and do not press any options, hold on the line and a nurse will answer the phone.    For prescription refill requests, have your pharmacy contact our office and allow 72 hours.

## 2023-08-26 DIAGNOSIS — Z85828 Personal history of other malignant neoplasm of skin: Secondary | ICD-10-CM | POA: Diagnosis not present

## 2023-08-26 DIAGNOSIS — Z85820 Personal history of malignant melanoma of skin: Secondary | ICD-10-CM | POA: Diagnosis not present

## 2023-08-26 DIAGNOSIS — E538 Deficiency of other specified B group vitamins: Secondary | ICD-10-CM | POA: Diagnosis not present

## 2023-08-26 DIAGNOSIS — L218 Other seborrheic dermatitis: Secondary | ICD-10-CM | POA: Diagnosis not present

## 2023-08-26 DIAGNOSIS — L570 Actinic keratosis: Secondary | ICD-10-CM | POA: Diagnosis not present

## 2023-09-23 DIAGNOSIS — D518 Other vitamin B12 deficiency anemias: Secondary | ICD-10-CM | POA: Diagnosis not present

## 2023-10-16 ENCOUNTER — Ambulatory Visit

## 2023-10-16 VITALS — BP 150/82 | HR 73 | Ht 62.0 in | Wt 143.1 lb

## 2023-10-16 DIAGNOSIS — I10 Essential (primary) hypertension: Secondary | ICD-10-CM

## 2023-10-16 DIAGNOSIS — D518 Other vitamin B12 deficiency anemias: Secondary | ICD-10-CM

## 2023-10-16 MED ORDER — CYANOCOBALAMIN 1000 MCG/ML IJ SOLN
1000.0000 ug | INTRAMUSCULAR | Status: AC
  Administered 2023-10-16 – 2024-02-14 (×3): 1000 ug via INTRAMUSCULAR

## 2023-10-16 NOTE — Progress Notes (Signed)
 New Patient Office Visit  Subjective    Patient ID: Sara Fischer, female    DOB: 04-Nov-1940  Age: 83 y.o. MRN: [id number]  CC:  Chief Complaint  Patient presents with   Establish Care    Pt here to establish care    HPI Sara Fischer presents to establish care.  She was a previous patient of Dr. Marvine in [place].  She is here today with her daughter.  She denies any new concerns or issues at this time.     Outpatient Encounter Medications as of 10/16/2023  Medication Sig   aspirin EC 81 MG tablet Take 81 mg by mouth daily.   Cholecalciferol (D3-1000 PO) Take 1 capsule by mouth daily.   Cyanocobalamin  (VITAMIN B 12) 250 MCG LOZG Take by mouth.   fluticasone  (FLONASE ) 50 MCG/ACT nasal spray Place 1 spray into both nostrils daily as needed for allergies or rhinitis.   furosemide (LASIX) 40 MG tablet    ketoconazole (NIZORAL) 2 % cream Apply 1 application topically 2 (two) times daily as needed.    Menaquinone-7 (VITAMIN K2 PO) Take 1 tablet by mouth daily.   metoprolol  succinate (TOPROL -XL) 100 MG 24 hr tablet Take 100 mg by mouth daily.   ondansetron  (ZOFRAN -ODT) 4 MG disintegrating tablet Take 1 tablet (4 mg total) by mouth every 8 (eight) hours as needed for nausea or vomiting.   potassium chloride  SA (KLOR-CON  M) 20 MEQ tablet Take 1 tablet (20 mEq total) by mouth 2 (two) times daily.   Propylene Glycol 0.6 % SOLN Place 1 drop into both eyes 2 (two) times daily as needed (dry eyes).   simvastatin  (ZOCOR ) 20 MG tablet Take 20 mg by mouth at bedtime.    Facility-Administered Encounter Medications as of 10/16/2023  Medication   cyanocobalamin  (VITAMIN B12) injection 1,000 mcg    Past Medical History:  Diagnosis Date   High cholesterol    Hypertension    IDA (iron  deficiency anemia)    Iron  deficiency anemia due to chronic blood loss 01/16/2023   PUD (peptic ulcer disease) 12/2022    Past Surgical History:  Procedure Laterality Date   ABDOMINAL HYSTERECTOMY      APPENDECTOMY     BIOPSY  12/26/2022   Procedure: BIOPSY;  Surgeon: Shaaron Lamar HERO, MD;  [facility];  Service: Endoscopy;;   COLONOSCOPY  2010   Dr. Shaaron: pancolonic diverticulosis, sigmoid colon tubular adenoma removed.    COLONOSCOPY N/A 02/29/2016   Procedure: COLONOSCOPY;  Surgeon: Lamar HERO Shaaron, MD;  [facility];  Service: Endoscopy;  Laterality: N/A;  230   COLONOSCOPY WITH PROPOFOL  N/A 12/26/2022   Surgeon: Shaaron Lamar HERO, MD;   pancolonic diverticulosis, otherwise normal exam.   ESOPHAGOGASTRODUODENOSCOPY (EGD) WITH PROPOFOL  N/A 12/26/2022   Surgeon: Shaaron Lamar HERO, MD;  Normal esophagus, large hiatal hernia, antral ulcer with satellite erosions s/p biopsy.  Pathology was benign.  No H. pylori, metaplasia, dysplasia.   ESOPHAGOGASTRODUODENOSCOPY (EGD) WITH PROPOFOL  N/A 05/16/2023   Procedure: ESOPHAGOGASTRODUODENOSCOPY (EGD) WITH PROPOFOL ;  Surgeon: Shaaron Lamar HERO, MD;  [facility];  Service: Endoscopy;  Laterality: N/A;  7:30 am, asa 2   INCISIONAL HERNIA REPAIR N/A 12/28/2016   Procedure: HERNIA REPAIR INCISIONAL;  Surgeon: Mavis Anes, MD;  Location: [facility];  Service: General;  Laterality: N/A;   KNEE ARTHROSCOPY     bilateral   LAPAROSCOPIC BILATERAL SALPINGO OOPHERECTOMY  2001   PARTIAL HYSTERECTOMY  1979    Family History  Problem Relation Age of Onset   Diabetes Mother    Heart disease Mother    Heart disease Father 54   Dementia Sister    Heart disease Brother    Diabetes Sister    Heart disease Sister    Heart disease Sister    Diabetes Sister    Arthritis Sister    Heart disease Sister    Heart disease Brother    Healthy Son    Healthy Son    Colon cancer Neg Hx     Social History   Socioeconomic History   Marital status: Widowed    Spouse name: Not on file   Number of children: 4   Years of education: Not on file   Highest education level: Not on file  Occupational History   Occupation: Retired  Tobacco Use    Smoking status: Former    Current packs/day: 0.00    Average packs/day: 0.5 packs/day for 5.2 years (2.6 ttl pk-yrs)    Types: Cigarettes    Start date: 08/28/1963    Quit date: 11/02/1968    Years since quitting: 55.0   Smokeless tobacco: Never  Vaping Use   Vaping status: Never Used  Substance and Sexual Activity   Alcohol use: Not Currently    Comment: wine occas.   Drug use: No   Sexual activity: Not on file  Other Topics Concern   Not on file  Social History Narrative   Right handed    Caffeine use: 1 cup coffee every morning   1 soda per day   Social Drivers of Health   Financial Resource Strain: Not on file  Food Insecurity: Not on file  Transportation Needs: Not on file  Physical Activity: Not on file  Stress: Not on file  Social Connections: Not on file  Intimate Partner Violence: Not on file    ROS      Objective    BP (!) 150/82 (BP Location: Left Arm, Patient Position: Sitting, Cuff Size: Normal)   Pulse 73   Ht 5' 2 (1.575 m)   Wt 143 lb 1.9 oz (64.9 kg)   SpO2 95%   BMI 26.18 kg/m   Physical Exam Vitals and nursing note reviewed.  Constitutional:      Appearance: Normal appearance.  HENT:     Head: Normocephalic.  Eyes:     Extraocular Movements: Extraocular movements intact.     Pupils: Pupils are equal, round, and reactive to light.  Cardiovascular:     Rate and Rhythm: Normal rate and regular rhythm.  Pulmonary:     Effort: Pulmonary effort is normal.     Breath sounds: Normal breath sounds.  Musculoskeletal:     Cervical back: Normal range of motion and neck supple.  Neurological:     Mental Status: She is alert and oriented to person, place, and time.  Psychiatric:        Mood and Affect: Mood normal.        Thought Content: Thought content normal.         Assessment & Plan:   Problem List Items Addressed This Visit       Cardiovascular and Mediastinum   Hypertension   BP is mildly elevated today, but she did not  have her metoprolol  this morning.  No medication changes made at this time.  Continue with current medications.  Recommend f/u in 3 months.         Other   Absolute anemia - Primary  B12 injection administered in office today.  We will maintain injection of this every 30 days.       Relevant Medications   cyanocobalamin  (VITAMIN B12) injection 1,000 mcg    Return in about 3 months (around 01/16/2024), or F/U in 4 weeks for B12 shot, for chronic follow-up with PCP.   Leita Longs, FNP

## 2023-10-16 NOTE — Patient Instructions (Signed)
 F/U in 4 weeks for B12 shot and 3-4 months for routine f/u for BP check.    Recommend flu shot in September.

## 2023-10-24 ENCOUNTER — Encounter: Payer: Self-pay | Admitting: Oncology

## 2023-10-24 NOTE — Assessment & Plan Note (Signed)
 B12 injection administered in office today.  We will maintain injection of this every 30 days.

## 2023-10-24 NOTE — Assessment & Plan Note (Signed)
 BP is mildly elevated today, but she did not have her metoprolol  this morning.  No medication changes made at this time.  Continue with current medications.  Recommend f/u in 3 months.

## 2023-10-25 ENCOUNTER — Encounter: Payer: Self-pay | Admitting: Internal Medicine

## 2023-11-18 ENCOUNTER — Ambulatory Visit (INDEPENDENT_AMBULATORY_CARE_PROVIDER_SITE_OTHER)

## 2023-11-18 DIAGNOSIS — D518 Other vitamin B12 deficiency anemias: Secondary | ICD-10-CM | POA: Diagnosis not present

## 2023-11-18 MED ORDER — CYANOCOBALAMIN 1000 MCG/ML IJ SOLN
1000.0000 ug | Freq: Once | INTRAMUSCULAR | Status: AC
  Administered 2023-11-18: 1000 ug via INTRAMUSCULAR

## 2023-11-18 NOTE — Progress Notes (Signed)
 Patient is in office today for a nurse visit for B12 Injection. Patient Injection was given in the  Left deltoid. Patient tolerated injection well.

## 2023-11-19 ENCOUNTER — Telehealth: Payer: Self-pay

## 2023-11-19 NOTE — Telephone Encounter (Signed)
  Patient is almost out of this medication  Prescription Request  11/19/2023  LOV: 10/16/2023  What is the name of the medication or equipment? simvastatin  (ZOCOR ) 20 MG tablet [[id number]]   metoprolol  succinate (TOPROL -XL) 100 MG 24 hr tablet [[id number]]    furosemide  (LASIX ) 40 MG tablet [[id number]]   Have you contacted your pharmacy to request a refill? Yes   Which pharmacy would you like this sent to?  [facility] [phone] - [place], [place] - [address] AT [facility] [address] & [address] [place] [address] Phone: [phone] Fax: [phone]    Patient notified that their request is being sent to the clinical staff for review and that they should receive a response within 2 business days.   Please advise at came into the office.

## 2023-11-20 ENCOUNTER — Other Ambulatory Visit: Payer: Self-pay

## 2023-11-20 DIAGNOSIS — E782 Mixed hyperlipidemia: Secondary | ICD-10-CM

## 2023-11-20 DIAGNOSIS — I10 Essential (primary) hypertension: Secondary | ICD-10-CM

## 2023-11-20 MED ORDER — METOPROLOL SUCCINATE ER 100 MG PO TB24
100.0000 mg | ORAL_TABLET | Freq: Every day | ORAL | 3 refills | Status: AC
Start: 2023-11-20 — End: ?

## 2023-11-20 MED ORDER — FUROSEMIDE 40 MG PO TABS: 40.0000 mg | ORAL_TABLET | Freq: Every day | ORAL | 3 refills | Status: AC

## 2023-11-20 MED ORDER — SIMVASTATIN 20 MG PO TABS: 20.0000 mg | ORAL_TABLET | Freq: Every day | ORAL | 3 refills | Status: AC

## 2023-12-06 DIAGNOSIS — H6123 Impacted cerumen, bilateral: Secondary | ICD-10-CM | POA: Diagnosis not present

## 2023-12-18 ENCOUNTER — Ambulatory Visit (INDEPENDENT_AMBULATORY_CARE_PROVIDER_SITE_OTHER)

## 2023-12-18 DIAGNOSIS — D518 Other vitamin B12 deficiency anemias: Secondary | ICD-10-CM

## 2023-12-18 DIAGNOSIS — Z23 Encounter for immunization: Secondary | ICD-10-CM | POA: Diagnosis not present

## 2024-01-06 ENCOUNTER — Other Ambulatory Visit: Payer: Self-pay | Admitting: Oral Surgery

## 2024-01-08 LAB — SURGICAL PATHOLOGY

## 2024-01-23 ENCOUNTER — Ambulatory Visit (INDEPENDENT_AMBULATORY_CARE_PROVIDER_SITE_OTHER)

## 2024-01-23 VITALS — BP 143/77 | HR 33 | Ht 62.0 in | Wt 142.1 lb

## 2024-01-23 DIAGNOSIS — Z23 Encounter for immunization: Secondary | ICD-10-CM

## 2024-01-23 DIAGNOSIS — D518 Other vitamin B12 deficiency anemias: Secondary | ICD-10-CM

## 2024-01-23 DIAGNOSIS — I10 Essential (primary) hypertension: Secondary | ICD-10-CM

## 2024-01-23 MED ORDER — CYANOCOBALAMIN 1000 MCG/ML IJ SOLN
1000.0000 ug | INTRAMUSCULAR | Status: AC
  Administered 2024-01-23: 1000 ug via INTRAMUSCULAR

## 2024-01-23 NOTE — Assessment & Plan Note (Signed)
 Blood pressure well-controlled with current medication. - Continue current antihypertensive regimen. - Instructed to contact [facility] for refills.

## 2024-01-23 NOTE — Assessment & Plan Note (Signed)
 B12 injection administered in office today.  We will maintain injection of this every 30 days. - Schedule nurse visit for B12 injection on December 5th since she will be out of town from December 7 through 12.

## 2024-01-23 NOTE — Progress Notes (Signed)
 Established Patient Office Visit  Subjective   Patient ID: Sara Fischer, female    DOB: April 29, 1940  Age: 83 y.o. 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    SBO (small bowel obstruction) (HCC) 12/26/2016   Hypertension 12/26/2016   Abdominal wall hernia    Heme positive stool 02/08/2016      ROS    Objective:     BP (!) 143/77 (BP Location: Right Arm, Patient Position: Sitting, Cuff Size: Small)   Pulse (!) 33   Ht 5' 2 (1.575 m)   Wt 142 lb 1.9 oz (64.5 kg)   SpO2 (!) 71%   BMI 25.99 kg/m  BP Readings from Last 3 Encounters:  01/23/24 (!) 143/77  10/16/23 (!) 150/82  08/19/23 120/67   Wt Readings from Last 3 Encounters:  01/23/24 142 lb 1.9 oz (64.5 kg)  10/16/23 143 lb 1.9 oz (64.9 kg)  08/19/23 142 lb 6.7 oz (64.6 kg)     Physical Exam Vitals and nursing note reviewed.  Constitutional:      Appearance: Normal appearance.  HENT:     Head: Normocephalic.  Eyes:     Extraocular Movements: Extraocular movements intact.     Pupils: Pupils are equal, round, and reactive to light.  Cardiovascular:     Rate and Rhythm: Normal rate and regular rhythm.  Pulmonary:     Effort: Pulmonary effort is normal.     Breath sounds: Normal breath sounds.  Musculoskeletal:     Cervical back: Normal range of motion and neck supple.  Neurological:     Mental Status: She is alert and oriented to person, place, and time.  Psychiatric:        Mood and Affect: Mood normal.        Thought Content: Thought content normal.      No results found for any visits on 01/23/24.  Last CBC Lab Results  Component Value Date   WBC 9.8 08/19/2023   HGB 11.2 (L)  08/19/2023   HCT 34.5 (L) 08/19/2023   MCV 95.3 08/19/2023   MCH 30.9 08/19/2023   RDW 12.5 08/19/2023   PLT 334 08/19/2023   Last metabolic panel Lab Results  Component Value Date   GLUCOSE 107 (H) 08/12/2023   NA 137 08/12/2023   K 3.9 08/12/2023   CL 100 08/12/2023   CO2 26 08/12/2023   BUN 15 08/12/2023   CREATININE 0.78 08/12/2023   GFRNONAA >60 08/12/2023   CALCIUM 9.7 08/12/2023   PHOS 3.4 12/29/2016   PROT 8.1 08/12/2023   ALBUMIN 4.1 08/12/2023   LABGLOB 3.0 08/03/2019   AGRATIO 1.2 08/03/2019   BILITOT 0.9 08/12/2023   ALKPHOS 70 08/12/2023   AST 18 08/12/2023   ALT 11 08/12/2023   ANIONGAP 11 08/12/2023   Last vitamin D Lab Results  Component Value Date   VD25OH 81.32 10/18/2020   Last vitamin B12 and Folate Lab Results  Component Value Date   VITAMINB12 5,149 (H) 10/26/2022   FOLATE 13.3 10/15/2019      The ASCVD Risk score (Arnett DK, et al., 2019) failed to calculate for the following reasons:  The 2019 ASCVD risk score is only valid for ages 24 to 1    Assessment & Plan:   Problem List Items Addressed This Visit       Cardiovascular and Mediastinum   Hypertension - Primary    Blood pressure well-controlled with current medication. - Continue current antihypertensive regimen. - Instructed to contact [facility] for refills.        Other   Absolute anemia   B12 injection administered in office today.  We will maintain injection of this every 30 days. - Schedule nurse visit for B12 injection on December 5th since she will be out of town from December 7 through 12.      Relevant Medications   cyanocobalamin  (VITAMIN B12) injection 1,000 mcg   Other Visit Diagnoses       Need for Streptococcus pneumoniae vaccination       Relevant Orders   Pneumococcal conjugate vaccine 20-valent (Prevnar 20)     Encounter for immunization       Relevant Orders   Pneumococcal conjugate vaccine 20-valent (Completed)      No follow-ups on file.     Leita Longs, FNP

## 2024-02-14 ENCOUNTER — Ambulatory Visit

## 2024-02-14 DIAGNOSIS — D518 Other vitamin B12 deficiency anemias: Secondary | ICD-10-CM

## 2024-02-14 NOTE — Progress Notes (Signed)
 Patient is in office today for a nurse visit for B12 Injection. Patient Injection was given in the  Right deltoid. Patient tolerated injection well.

## 2024-02-24 ENCOUNTER — Other Ambulatory Visit

## 2024-02-24 ENCOUNTER — Ambulatory Visit: Admitting: Physician Assistant

## 2024-02-27 NOTE — Progress Notes (Unsigned)
 Fischer Fischer, Fischer Fischer  PCP:  Bevely Doffing, FNP [address] [place] [address] [phone]   REASON FOR VISIT:  Follow-up for iron  deficiency anemia   CURRENT THERAPY: Oral iron  tablets and intermittent IV iron    INTERVAL HISTORY:   Fischer Fischer 83 y.o. female returns for routine follow-up of her iron  deficiency anemia.   She was last seen by Pleasant Barefoot PA-C on 08/19/2023.     At today's visit, she reports feeling ***. She has 75***% energy and 100***% appetite.  ***She endorses that she is maintaining a stable weight. She continues to remain active and goes dancing at least 4 nights per week, and also enjoys spending time with her great-grandchildren. ***  She denies any fatigue, pica, headaches, lightheadedness, syncope.*** She has not had any chest pain or dyspnea on exertion.*** She denies any frank hematochezia or melena.*** She takes B12 tablet daily and is also getting B12 shots monthly from Dr. Bertell.*** She stopped taking iron  due to side effects.*** She takes aspirin 81 mg daily.   ***  ASSESSMENT & PLAN:  1.  Normocytic anemia with iron  deficiency - Referred by primary care provider (Dr. Bertell) in March 2022 - CBC on 04/28/2019 at Dr. Auther office showed hemoglobin 9.5, MCV of 89. White count was slightly low at 3.4 and platelets were 229. Differential was normal.  Ferritin was 15.  Normal B12 and folic acid . Percent saturation was 9.0, reticulocyte was 2% - Hemoccult POSITIVE 3/3 in September 2024 - EGD (12/26/2022): Normal esophagus, large hiatal hernia, antral ulcer with satellite erosions - Most recent EGD (05/16/2023) shows large hiatal hernia, but previously noted ulcer has resolved. - Colonoscopy (12/26/2022): Diverticulosis in entire colon, otherwise normal exam - SPEP negative, LDH normal, stool occult blood normal.  Normal creatinine without  evidence of kidney dysfunction. - She was unable to tolerate oral iron  due to constipation. - Vitamin B12 is being managed by her PCP, currently taking daily supplement and receiving monthly B12 injections. - Most recent IV iron  with Venofer  400 mg x 1 on 01/22/2023 - Most recent labs (***): *** - No bright red blood per rectum or melena.*** - PLAN: *** TBD.  *** No indication for IV iron  at this time. - Labs (CBC/D, ferritin, iron /TIBC) and RTC in 6 months  PLAN SUMMARY: *** TBD *** >> Same-day labs (CBC/D, ferritin, iron /TIBC) + OFFICE visit in 6 months    REVIEW OF SYSTEMS: ***  Review of Systems  Constitutional:  Negative for appetite change, chills, diaphoresis, fatigue, fever and unexpected weight change.  HENT:   Negative for lump/mass and nosebleeds.   Eyes:  Negative for eye problems.  Respiratory:  Negative for cough, hemoptysis and shortness of breath.   Cardiovascular:  Negative for chest pain, leg swelling and palpitations.  Gastrointestinal:  Negative for abdominal pain, blood in stool, constipation, diarrhea, nausea and vomiting.  Genitourinary:  Negative for difficulty urinating and hematuria.   Skin: Negative.   Neurological:  Positive for headaches (occasional) and numbness (fingertips). Negative for dizziness and light-headedness.  Hematological:  Does not bruise/bleed easily.     PHYSICAL EXAM:***  ECOG PERFORMANCE STATUS: 0 - Asymptomatic  There were no vitals filed for this visit.    There were no vitals filed for this visit.   Physical Exam Constitutional:      Appearance: Normal appearance. She is normal weight.  Cardiovascular:  Heart sounds: Normal heart sounds.  Pulmonary:     Breath sounds: Normal breath sounds.  Neurological:     General: No focal deficit present.     Mental Status: Mental status is at baseline.  Psychiatric:        Behavior: Behavior normal. Behavior is cooperative.    PAST MEDICAL/SURGICAL HISTORY:  Past Medical  History:  Diagnosis Date   High cholesterol    Hypertension    IDA (iron  deficiency anemia)    Iron  deficiency anemia due to chronic blood loss 01/16/2023   PUD (peptic ulcer disease) 12/2022   Past Surgical History:  Procedure Laterality Date   ABDOMINAL HYSTERECTOMY     APPENDECTOMY     BIOPSY  12/26/2022   Procedure: BIOPSY;  Surgeon: Shaaron Lamar HERO, MD;  [facility];  Service: Endoscopy;;   COLONOSCOPY  2010   Dr. Shaaron: pancolonic diverticulosis, sigmoid colon tubular adenoma removed.    COLONOSCOPY N/A 02/29/2016   Procedure: COLONOSCOPY;  Surgeon: Lamar HERO Shaaron, MD;  [facility];  Service: Endoscopy;  Laterality: N/A;  230   COLONOSCOPY WITH PROPOFOL  N/A 12/26/2022   Surgeon: Shaaron Lamar HERO, MD;   pancolonic diverticulosis, otherwise normal exam.   ESOPHAGOGASTRODUODENOSCOPY (EGD) WITH PROPOFOL  N/A 12/26/2022   Surgeon: Shaaron Lamar HERO, MD;  Normal esophagus, large hiatal hernia, antral ulcer with satellite erosions s/p biopsy.  Pathology was benign.  No H. pylori, metaplasia, dysplasia.   ESOPHAGOGASTRODUODENOSCOPY (EGD) WITH PROPOFOL  N/A 05/16/2023   Procedure: ESOPHAGOGASTRODUODENOSCOPY (EGD) WITH PROPOFOL ;  Surgeon: Shaaron Lamar HERO, MD;  [facility];  Service: Endoscopy;  Laterality: N/A;  7:30 am, asa 2   INCISIONAL HERNIA REPAIR N/A 12/28/2016   Procedure: HERNIA REPAIR INCISIONAL;  Surgeon: Mavis Anes, MD;  Location: [facility];  Service: General;  Laterality: N/A;   KNEE ARTHROSCOPY     bilateral   LAPAROSCOPIC BILATERAL SALPINGO OOPHERECTOMY  2001   PARTIAL HYSTERECTOMY  1979    SOCIAL HISTORY:  Social History   Socioeconomic History   Marital status: Widowed    Spouse name: Not on file   Number of children: 4   Years of education: Not on file   Highest education level: Not on file  Occupational History   Occupation: Retired  Tobacco Use   Smoking status: Former    Current packs/day: 0.00    Average packs/day: 0.5  packs/day for 5.2 years (2.6 ttl pk-yrs)    Types: Cigarettes    Start date: 08/28/1963    Quit date: 11/02/1968    Years since quitting: 55.3   Smokeless tobacco: Never  Vaping Use   Vaping status: Never Used  Substance and Sexual Activity   Alcohol use: Not Currently    Comment: wine occas.   Drug use: No   Sexual activity: Not on file  Other Topics Concern   Not on file  Social History Narrative   Right handed    Caffeine use: 1 cup coffee every morning   1 soda per day   Social Drivers of Health   Tobacco Use: Medium Risk (01/23/2024)   Patient History    Smoking Tobacco Use: Former    Smokeless Tobacco Use: Never    Passive Exposure: Not on Actuary Strain: Not on file  Food Insecurity: Not on file  Transportation Needs: Not on file  Physical Activity: Not on file  Stress: Not on file  Social Connections: Not on file  Intimate Partner Violence: Not on file  Depression (PHQ2-9): Low Risk (10/16/2023)   Depression (PHQ2-9)    PHQ-2 Score: 0  Alcohol Screen: Not on file  Housing: Not on file  Utilities: Not on file  Health Literacy: Not on file    FAMILY HISTORY:  Family History  Problem Relation Age of Onset   Diabetes Mother    Heart disease Mother    Heart disease Father 42   Dementia Sister    Heart disease Brother    Diabetes Sister    Heart disease Sister    Heart disease Sister    Diabetes Sister    Arthritis Sister    Heart disease Sister    Heart disease Brother    Healthy Son    Healthy Son    Colon cancer Neg Hx     CURRENT MEDICATIONS:  Outpatient Encounter Medications as of 03/02/2024  Medication Sig   aspirin EC 81 MG tablet Take 81 mg by mouth daily.   Cholecalciferol (D3-1000 PO) Take 1 capsule by mouth daily.   Cyanocobalamin  (VITAMIN B 12) 250 MCG LOZG Take by mouth.   fluticasone  (FLONASE ) 50 MCG/ACT nasal spray Place 1 spray into both nostrils daily as needed for allergies or rhinitis.   furosemide  (LASIX ) 40 MG  tablet Take 1 tablet (40 mg total) by mouth daily.   ketoconazole (NIZORAL) 2 % cream Apply 1 application topically 2 (two) times daily as needed.    Menaquinone-7 (VITAMIN K2 PO) Take 1 tablet by mouth daily.   metoprolol  succinate (TOPROL -XL) 100 MG 24 hr tablet Take 1 tablet (100 mg total) by mouth daily.   ondansetron  (ZOFRAN -ODT) 4 MG disintegrating tablet Take 1 tablet (4 mg total) by mouth every 8 (eight) hours as needed for nausea or vomiting.   Propylene Glycol 0.6 % SOLN Place 1 drop into both eyes 2 (two) times daily as needed (dry eyes).   simvastatin  (ZOCOR ) 20 MG tablet Take 1 tablet (20 mg total) by mouth at bedtime.   Facility-Administered Encounter Medications as of 03/02/2024  Medication   cyanocobalamin  (VITAMIN B12) injection 1,000 mcg   cyanocobalamin  (VITAMIN B12) injection 1,000 mcg    ALLERGIES:  Allergies  Allergen Reactions   Feldene [Piroxicam]     Headaches    Lodine [Etodolac]     headaches    LABORATORY DATA:  I have reviewed the labs as listed.  CBC    Component Value Date/Time   WBC 9.8 08/19/2023 0818   RBC 3.62 (L) 08/19/2023 0818   HGB 11.2 (L) 08/19/2023 0818   HCT 34.5 (L) 08/19/2023 0818   PLT 334 08/19/2023 0818   MCV 95.3 08/19/2023 0818   MCH 30.9 08/19/2023 0818   MCHC 32.5 08/19/2023 0818   RDW 12.5 08/19/2023 0818   LYMPHSABS 2.7 08/19/2023 0818   MONOABS 1.0 08/19/2023 0818   EOSABS 0.2 08/19/2023 0818   BASOSABS 0.0 08/19/2023 0818      Latest Ref Rng & Units 08/12/2023   11:01 AM 05/14/2023    9:20 AM 01/11/2023    9:43 AM  CMP  Glucose 70 - 99 mg/dL 892  897  874   BUN 8 - 23 mg/dL 15  12  16    Creatinine 0.44 - 1.00 mg/dL 9.21  9.18  9.05   Sodium 135 - 145 mmol/L 137  140  141   Potassium 3.5 - 5.1 mmol/L 3.9  3.7  2.8   Chloride 98 - 111 mmol/L 100  103  98   CO2 22 - 32 mmol/L  26  24  30    Calcium 8.9 - 10.3 mg/dL 9.7  9.9  9.6   Total Protein 6.5 - 8.1 g/dL 8.1   6.5   Total Bilirubin 0.0 - 1.2 mg/dL 0.9   0.6    Alkaline Phos 38 - 126 U/L 70   62   AST 15 - 41 U/L 18   20   ALT 0 - 44 U/L 11   12     DIAGNOSTIC IMAGING:  I have independently reviewed the relevant imaging and discussed with the patient.   WRAP UP:  All questions were answered. The patient knows to call the clinic with any problems, questions or concerns.  Medical decision making: Low***  Time spent on visit: I spent 15 minutes counseling the patient face to face. The total time spent in the appointment was 22 minutes and more than 50% was on counseling.  Pleasant CHRISTELLA Barefoot, PA-C  ***

## 2024-03-02 ENCOUNTER — Inpatient Hospital Stay: Admitting: Physician Assistant

## 2024-03-02 ENCOUNTER — Inpatient Hospital Stay: Attending: Physician Assistant

## 2024-03-02 VITALS — BP 157/82 | HR 71 | Temp 97.0°F | Resp 18 | Ht 61.0 in | Wt 141.0 lb

## 2024-03-02 DIAGNOSIS — D509 Iron deficiency anemia, unspecified: Secondary | ICD-10-CM | POA: Diagnosis present

## 2024-03-02 DIAGNOSIS — D649 Anemia, unspecified: Secondary | ICD-10-CM | POA: Diagnosis not present

## 2024-03-02 DIAGNOSIS — D500 Iron deficiency anemia secondary to blood loss (chronic): Secondary | ICD-10-CM | POA: Diagnosis not present

## 2024-03-02 LAB — IRON AND TIBC
Iron: 32 ug/dL (ref 28–170)
Saturation Ratios: 10 % — ABNORMAL LOW (ref 10.4–31.8)
TIBC: 332 ug/dL (ref 250–450)
UIBC: 300 ug/dL

## 2024-03-02 LAB — CBC WITH DIFFERENTIAL/PLATELET
Abs Immature Granulocytes: 0.06 K/uL (ref 0.00–0.07)
Basophils Absolute: 0 K/uL (ref 0.0–0.1)
Basophils Relative: 0 %
Eosinophils Absolute: 0.3 K/uL (ref 0.0–0.5)
Eosinophils Relative: 2 %
HCT: 33 % — ABNORMAL LOW (ref 36.0–46.0)
Hemoglobin: 10.4 g/dL — ABNORMAL LOW (ref 12.0–15.0)
Immature Granulocytes: 1 %
Lymphocytes Relative: 24 %
Lymphs Abs: 2.6 K/uL (ref 0.7–4.0)
MCH: 29.9 pg (ref 26.0–34.0)
MCHC: 31.5 g/dL (ref 30.0–36.0)
MCV: 94.8 fL (ref 80.0–100.0)
Monocytes Absolute: 1.2 K/uL — ABNORMAL HIGH (ref 0.1–1.0)
Monocytes Relative: 11 %
Neutro Abs: 6.7 K/uL (ref 1.7–7.7)
Neutrophils Relative %: 62 %
Platelets: 297 K/uL (ref 150–400)
RBC: 3.48 MIL/uL — ABNORMAL LOW (ref 3.87–5.11)
RDW: 14.3 % (ref 11.5–15.5)
WBC: 10.8 K/uL — ABNORMAL HIGH (ref 4.0–10.5)
nRBC: 0 % (ref 0.0–0.2)

## 2024-03-02 LAB — FERRITIN: Ferritin: 125 ng/mL (ref 11–307)

## 2024-03-02 NOTE — Patient Instructions (Signed)
 [facility] at [facility] **VISIT SUMMARY & IMPORTANT INSTRUCTIONS **   You were seen today by Pleasant Barefoot PA-C for your iron  deficiency anemia.   Your blood and iron  levels are mildly low. Will schedule you for IV iron  x 1. We will repeat labs and see you for follow-up in 4 months   ** Thank you for trusting me with your healthcare!  I strive to provide all of my patients with quality care at each visit.  If you receive a survey for this visit, I would be so grateful to you for taking the time to provide feedback.  Thank you in advance!  ~ Alastor Kneale                                        Dr. Mickiel Davonna Pleasant Barefoot, PA-C          Delon Hope, NP  - - - - - - - - - - - - - - - - - -    Thank you for choosing [facility] at [facility] to provide your oncology and hematology care.  To afford each patient quality time with our provider, please arrive at least 15 minutes before your scheduled appointment time.   If you have a lab appointment with the Cancer Center please come in thru the Main Entrance and check in at the main information desk.  You need to re-schedule your appointment should you arrive 10 or more minutes late.  We strive to give you quality time with our providers, and arriving late affects you and other patients whose appointments are after yours.  Also, if you no show three or more times for appointments you may be dismissed from the clinic at the providers discretion.     Again, thank you for choosing [facility]  Our hope is that these requests will decrease the amount of time that you wait before being seen by our physicians.       _____________________________________________________________  Should you have questions after your visit to [facility], please contact our office at [phone] and follow the prompts.  Our office hours are 8:00 a.m. and 4:30 p.m. Monday -  Friday.  Please note that voicemails left after 4:00 p.m. may not be returned until the following business day.  We are closed weekends and major holidays.  You do have access to a nurse 24-7, just call the main number to the clinic [phone] and do not press any options, hold on the line and a nurse will answer the phone.    For prescription refill requests, have your pharmacy contact our office and allow 72 hours.

## 2024-03-16 ENCOUNTER — Inpatient Hospital Stay: Attending: Physician Assistant

## 2024-03-16 VITALS — BP 195/53 | HR 69 | Temp 97.3°F | Resp 17

## 2024-03-16 DIAGNOSIS — D500 Iron deficiency anemia secondary to blood loss (chronic): Secondary | ICD-10-CM

## 2024-03-16 DIAGNOSIS — D509 Iron deficiency anemia, unspecified: Secondary | ICD-10-CM | POA: Insufficient documentation

## 2024-03-16 MED ORDER — SODIUM CHLORIDE 0.9 % IV SOLN: INTRAVENOUS | Status: DC

## 2024-03-16 MED ORDER — IRON SUCROSE 400 MG IVPB - SIMPLE MED
400.0000 mg | Freq: Once | Status: AC
  Administered 2024-03-16: 400 mg via INTRAVENOUS
  Filled 2024-03-16: qty 400

## 2024-03-16 NOTE — Patient Instructions (Signed)
 [facility] [place] - A DEPT OF [facility]  Discharge Instructions: Thank you for choosing [facility] to provide your oncology and hematology care.  If you have a lab appointment with the Cancer Center - please note that after April 8th, 2024, all labs will be drawn in the cancer center.  You do not have to check in or register with the main entrance as you have in the past but will complete your check-in in the cancer center.  Wear comfortable clothing and clothing appropriate for easy access to any Portacath or PICC line.   We strive to give you quality time with your provider. You may need to reschedule your appointment if you arrive late (15 or more minutes).  Arriving late affects you and other patients whose appointments are after yours.  Also, if you miss three or more appointments without notifying the office, you may be dismissed from the clinic at the provider's discretion.      For prescription refill requests, have your pharmacy contact our office and allow 72 hours for refills to be completed.    Today you received the following chemotherapy and/or immunotherapy agents Venofer. Iron Sucrose Injection What is this medication? IRON SUCROSE (EYE ern SOO krose) treats low levels of iron (iron deficiency anemia) in people with kidney disease. Iron is a mineral that plays an important role in making red blood cells, which carry oxygen from your lungs to the rest of your body. This medicine may be used for other purposes; ask your health care provider or pharmacist if you have questions. COMMON BRAND NAME(S): Venofer What should I tell my care team before I take this medication? They need to know if you have any of these conditions: Anemia not caused by low iron levels Heart disease High levels of iron in the blood Kidney disease Liver disease An unusual or allergic reaction to iron, other medications, foods, dyes, or preservatives Pregnant or  trying to get pregnant Breastfeeding How should I use this medication? This medication is infused into a vein. It is given by your care team in a hospital or clinic setting. Talk to your care team about the use of this medication in children. While it may be prescribed for children as young as 2 years for selected conditions, precautions do apply. Overdosage: If you think you have taken too much of this medicine contact a poison control center or emergency room at once. NOTE: This medicine is only for you. Do not share this medicine with others. What if I miss a dose? Keep appointments for follow-up doses. It is important not to miss your dose. Call your care team if you are unable to keep an appointment. What may interact with this medication? Do not take this medication with any of the following: Deferoxamine Dimercaprol Other iron products This medication may also interact with the following: Chloramphenicol Deferasirox This list may not describe all possible interactions. Give your health care provider a list of all the medicines, herbs, non-prescription drugs, or dietary supplements you use. Also tell them if you smoke, drink alcohol, or use illegal drugs. Some items may interact with your medicine. What should I watch for while using this medication? Visit your care team for regular checks on your progress. Tell your care team if your symptoms do not start to get better or if they get worse. You may need blood work done while you are taking this medication. You may need to eat  more foods that contain iron. Talk to your care team. Foods that contain iron include whole grains or cereals, dried fruits, beans, peas, leafy green vegetables, and organ meats (liver, kidney). What side effects may I notice from receiving this medication? Side effects that you should report to your care team as soon as possible: Allergic reactions--skin rash, itching, hives, swelling of the face, lips, tongue,  or throat Low blood pressure--dizziness, feeling faint or lightheaded, blurry vision Shortness of breath Side effects that usually do not require medical attention (report to your care team if they continue or are bothersome): Flushing Headache Joint pain Muscle pain Nausea Pain, redness, or irritation at injection site This list may not describe all possible side effects. Call your doctor for medical advice about side effects. You may report side effects to FDA at 1-800-FDA-1088. Where should I keep my medication? This medication is given in a hospital or clinic. It will not be stored at home. NOTE: This sheet is a summary. It may not cover all possible information. If you have questions about this medicine, talk to your doctor, pharmacist, or health care provider.  2024 Elsevier/Gold Standard (2022-10-17 00:00:00)      To help prevent nausea and vomiting after your treatment, we encourage you to take your nausea medication as directed.  BELOW ARE SYMPTOMS THAT SHOULD BE REPORTED IMMEDIATELY: *FEVER GREATER THAN 100.4 F (38 C) OR HIGHER *CHILLS OR SWEATING *NAUSEA AND VOMITING THAT IS NOT CONTROLLED WITH YOUR NAUSEA MEDICATION *UNUSUAL SHORTNESS OF BREATH *UNUSUAL BRUISING OR BLEEDING *URINARY PROBLEMS (pain or burning when urinating, or frequent urination) *BOWEL PROBLEMS (unusual diarrhea, constipation, pain near the anus) TENDERNESS IN MOUTH AND THROAT WITH OR WITHOUT PRESENCE OF ULCERS (sore throat, sores in mouth, or a toothache) UNUSUAL RASH, SWELLING OR PAIN  UNUSUAL VAGINAL DISCHARGE OR ITCHING   Items with * indicate a potential emergency and should be followed up as soon as possible or go to the Emergency Department if any problems should occur.  Please show the CHEMOTHERAPY ALERT CARD or IMMUNOTHERAPY ALERT CARD at check-in to the Emergency Department and triage nurse.  Should you have questions after your visit or need to cancel or reschedule your appointment, please  contact [facility] [place] - A DEPT OF [facility] [phone]  and follow the prompts.  Office hours are 8:00 a.m. to 4:30 p.m. Monday - Friday. Please note that voicemails left after 4:00 p.m. may not be returned until the following business day.  We are closed weekends and major holidays. You have access to a nurse at all times for urgent questions. Please call the main number to the clinic [phone] and follow the prompts.  For any non-urgent questions, you may also contact your provider using MyChart. We now offer e-Visits for anyone 70 and older to request care online for non-urgent symptoms. For details visit [email or web address].   Also download the MyChart app! Go to the app store, search "MyChart", open the app, select [place], and log in with your MyChart username and password.

## 2024-03-16 NOTE — Progress Notes (Signed)
 Patient presents today for Venofer  400 mg iron  infusion. Patient took Tylenol  650 mgs and 10 mg of Claritin  PO at 09:25 am. Blood pressure elevated on arrival. Patient states she just took her blood pressure medication at 09:25 am. Patient has no questions or concerns pertaining to iron  infusion.   Message sent to Dr. Davonna pertaining to elevated blood pressure. Order received to recheck blood pressure in 15 minutes. Patient asymptomatic. Order received to hold clonidine 0.2 mg standing order at this time.   Order received from dr. Davonna to discharge patient home. Patient has appointment with PCP this week and will discuss blood pressure medications at that time.   Venofer  given today per MD orders. Tolerated infusion without adverse affects. Vital signs stable. No complaints at this time. Discharged from clinic ambulatory in stable condition. Alert and oriented x 3. F/U with [facility] as scheduled.

## 2024-03-20 ENCOUNTER — Ambulatory Visit

## 2024-03-20 ENCOUNTER — Telehealth: Payer: Self-pay

## 2024-03-20 DIAGNOSIS — D518 Other vitamin B12 deficiency anemias: Secondary | ICD-10-CM | POA: Diagnosis not present

## 2024-03-20 MED ORDER — CYANOCOBALAMIN 1000 MCG/ML IJ SOLN
1000.0000 ug | Freq: Once | INTRAMUSCULAR | Status: AC
  Administered 2024-03-20: 1000 ug via INTRAMUSCULAR

## 2024-03-20 NOTE — Progress Notes (Signed)
 Patient is in office today for a nurse visit for B12 Injection. Patient Injection was given in the  Left deltoid. Patient tolerated injection well.

## 2024-03-20 NOTE — Telephone Encounter (Signed)
 Pt came in for b12 injection today. States at her iron  infusion on Monday , her bp went up to 207/86. Was advised to let her pcp know. Pt denied any dizziness, blurred vision, fatigue, or chest pains

## 2024-04-17 ENCOUNTER — Ambulatory Visit (INDEPENDENT_AMBULATORY_CARE_PROVIDER_SITE_OTHER)

## 2024-04-17 DIAGNOSIS — D518 Other vitamin B12 deficiency anemias: Secondary | ICD-10-CM

## 2024-04-17 MED ORDER — CYANOCOBALAMIN 1000 MCG/ML IJ SOLN
1000.0000 ug | Freq: Once | INTRAMUSCULAR | Status: AC
  Administered 2024-04-17: 1000 ug via INTRAMUSCULAR

## 2024-04-17 NOTE — Progress Notes (Signed)
 Patient is in office today for a nurse visit for B12 Injection. Patient Injection was given in the  Left deltoid. Patient tolerated injection well.

## 2024-06-24 ENCOUNTER — Inpatient Hospital Stay

## 2024-07-01 ENCOUNTER — Inpatient Hospital Stay: Admitting: Physician Assistant
